# Patient Record
Sex: Female | Born: 1965 | Race: White | Hispanic: No | Marital: Married | Smoking: Never smoker
Health system: Southern US, Community
[De-identification: ages and names within clinical notes are randomized; demographics above are authoritative.]

## PROBLEM LIST (undated history)

## (undated) DIAGNOSIS — F419 Anxiety disorder, unspecified: Secondary | ICD-10-CM

## (undated) DIAGNOSIS — J45909 Unspecified asthma, uncomplicated: Secondary | ICD-10-CM

## (undated) DIAGNOSIS — I319 Disease of pericardium, unspecified: Secondary | ICD-10-CM

## (undated) DIAGNOSIS — I219 Acute myocardial infarction, unspecified: Secondary | ICD-10-CM

## (undated) DIAGNOSIS — I2510 Atherosclerotic heart disease of native coronary artery without angina pectoris: Secondary | ICD-10-CM

## (undated) DIAGNOSIS — I509 Heart failure, unspecified: Secondary | ICD-10-CM

## (undated) DIAGNOSIS — M1990 Unspecified osteoarthritis, unspecified site: Secondary | ICD-10-CM

## (undated) DIAGNOSIS — J189 Pneumonia, unspecified organism: Secondary | ICD-10-CM

## (undated) HISTORY — PX: NASAL SEPTUM SURGERY: SHX37

## (undated) HISTORY — PX: DILATION AND CURETTAGE OF UTERUS: SHX78

## (undated) HISTORY — PX: COLONOSCOPY: SHX174

## (undated) HISTORY — PX: CARDIAC CATHETERIZATION: SHX172

## (undated) HISTORY — PX: OTHER SURGICAL HISTORY: SHX169

---

## 1998-01-04 ENCOUNTER — Ambulatory Visit (HOSPITAL_COMMUNITY): Admission: RE | Admit: 1998-01-04 | Discharge: 1998-01-04 | Payer: Self-pay | Admitting: *Deleted

## 1998-02-19 ENCOUNTER — Other Ambulatory Visit: Admission: RE | Admit: 1998-02-19 | Discharge: 1998-02-19 | Payer: Self-pay | Admitting: Obstetrics & Gynecology

## 1998-02-26 ENCOUNTER — Inpatient Hospital Stay (HOSPITAL_COMMUNITY): Admission: AD | Admit: 1998-02-26 | Discharge: 1998-02-28 | Payer: Self-pay | Admitting: *Deleted

## 1998-03-02 ENCOUNTER — Encounter (HOSPITAL_COMMUNITY): Admission: RE | Admit: 1998-03-02 | Discharge: 1998-05-31 | Payer: Self-pay | Admitting: *Deleted

## 1998-06-17 ENCOUNTER — Encounter (HOSPITAL_COMMUNITY): Admission: RE | Admit: 1998-06-17 | Discharge: 1998-09-15 | Payer: Self-pay | Admitting: *Deleted

## 1998-08-23 ENCOUNTER — Other Ambulatory Visit: Admission: RE | Admit: 1998-08-23 | Discharge: 1998-08-23 | Payer: Self-pay | Admitting: *Deleted

## 1999-09-14 ENCOUNTER — Other Ambulatory Visit: Admission: RE | Admit: 1999-09-14 | Discharge: 1999-09-14 | Payer: Self-pay | Admitting: *Deleted

## 2000-09-11 ENCOUNTER — Other Ambulatory Visit: Admission: RE | Admit: 2000-09-11 | Discharge: 2000-09-11 | Payer: Self-pay | Admitting: *Deleted

## 2001-09-18 ENCOUNTER — Other Ambulatory Visit: Admission: RE | Admit: 2001-09-18 | Discharge: 2001-09-18 | Payer: Self-pay | Admitting: *Deleted

## 2002-09-05 ENCOUNTER — Encounter: Payer: Self-pay | Admitting: Internal Medicine

## 2002-09-05 ENCOUNTER — Encounter: Admission: RE | Admit: 2002-09-05 | Discharge: 2002-09-05 | Payer: Self-pay | Admitting: Internal Medicine

## 2002-09-15 ENCOUNTER — Other Ambulatory Visit: Admission: RE | Admit: 2002-09-15 | Discharge: 2002-09-15 | Payer: Self-pay | Admitting: *Deleted

## 2002-10-13 ENCOUNTER — Ambulatory Visit (HOSPITAL_COMMUNITY): Admission: RE | Admit: 2002-10-13 | Discharge: 2002-10-13 | Payer: Self-pay | Admitting: Internal Medicine

## 2003-09-08 ENCOUNTER — Other Ambulatory Visit: Admission: RE | Admit: 2003-09-08 | Discharge: 2003-09-08 | Payer: Self-pay | Admitting: Obstetrics and Gynecology

## 2003-10-29 ENCOUNTER — Ambulatory Visit (HOSPITAL_COMMUNITY): Admission: RE | Admit: 2003-10-29 | Discharge: 2003-10-29 | Payer: Self-pay | Admitting: *Deleted

## 2005-05-15 ENCOUNTER — Encounter (INDEPENDENT_AMBULATORY_CARE_PROVIDER_SITE_OTHER): Payer: Self-pay | Admitting: *Deleted

## 2005-05-15 ENCOUNTER — Ambulatory Visit (HOSPITAL_COMMUNITY): Admission: RE | Admit: 2005-05-15 | Discharge: 2005-05-15 | Payer: Self-pay | Admitting: *Deleted

## 2005-05-25 ENCOUNTER — Ambulatory Visit (HOSPITAL_COMMUNITY): Admission: RE | Admit: 2005-05-25 | Discharge: 2005-05-25 | Payer: Self-pay | Admitting: *Deleted

## 2005-09-07 HISTORY — PX: OTHER SURGICAL HISTORY: SHX169

## 2009-10-05 ENCOUNTER — Encounter: Admission: RE | Admit: 2009-10-05 | Discharge: 2009-10-05 | Payer: Self-pay | Admitting: Internal Medicine

## 2010-12-23 NOTE — Op Note (Signed)
Bonnie Nelson, ADMIRE                  ACCOUNT NO.:  [id number]   MEDICAL RECORD NO.:  [id number]          PATIENT TYPE:  AMB   LOCATION:  ENDO                         FACILITY:  [facility]   PHYSICIAN:  Georgiana Spinner, M.D.    DATE OF BIRTH:  Jan 19, 1966   DATE OF PROCEDURE:  DATE OF DISCHARGE:                                 OPERATIVE REPORT   PROCEDURE:  Upper endoscopy with dilation and biopsy.   INDICATIONS:  Dysphagia.   ANESTHESIA:  Demerol 100, versed 10 mg.   PROCEDURE:  With the patient mildly sedated in room 2 of Radiology at [facility], the Olympus videoscopic endoscope was inserted in the mouth  and passed under direct vision through the esophagus, which was full of  refluxate.  We suctioned this out and advanced into the stomach and the  stomach was actually fairly full of bilious material, which we wound up  suctioning as well.  We pulled back to the esophagus and then advanced  distally once again, taking circumferential views of the esophageal mucosa,  until we reached the distal esophagus.  We pushed through the sphincter  fairly easily.  The Z-line was somewhat indistinct.  We advanced into the  stomach.  The fundus, body, antrum, duodenal bulb, and second portion of the  duodenum appeared normal.  From this point, the endoscope was slowly  withdrawn, taking circumferential views of the duodenal mucosa until the  endoscope had been pulled back into the stomach and placed in retroflexion  with view of the stomach from below.  The scope was then straightened and a  guidewire was passed.  The endoscope was withdrawn.  Subsequently, a 17  Savary dilator was passed rather easily over the guidewire.  With this, the  guidewire and the dilator were removed and the endoscope was reinserted.  No  bleeding was seen and we advanced to the distal esophagus and a biopsy was  taken.  The endoscope was withdrawn.  The patient's vital signs and pulse  oximetry remained stable.   The patient tolerated the procedure well without  apparent complications.   FINDINGS:  Rather unremarkable examination with dilation of the distal  esophageal sphincter to 17 Savary.  There was free reflux seen and copious  amounts of refluxate seen in the esophagus.  Biopsy of the distal esophagus  was taken, await biopsy report.   PLAN:  The patient obviously is having significant reflux and she may be a  candidate for reflux surgery.  We will await biopsy and have the patient  follow up with me.  If so, we will plan on doing esophageal manometry to  assess the ability of the esophagus to withstand a wrap and consider her for  this but I think given the findings, she certainly would be a good candidate  with persistent reflux despite b.i.d. Aciphex therapy.           ______________________________  Georgiana Spinner, M.D.     GMO/MEDQ  D:  05/15/2005  T:  05/15/2005  Job:  [id number]

## 2010-12-23 NOTE — Op Note (Signed)
[name], Bonnie Nelson                            ACCOUNT NO.:  [id number]   MEDICAL RECORD NO.:  [id number]                   PATIENT TYPE:  AMB   LOCATION:  ENDO                                 FACILITY:  [facility]   PHYSICIAN:  Georgiana Spinner, M.D.                 DATE OF BIRTH:  24-Apr-1966   DATE OF PROCEDURE:  10/29/2003  DATE OF DISCHARGE:                                 OPERATIVE REPORT   PROCEDURE:  Upper endoscopy.   INDICATIONS:  GERD symptoms.   ANESTHESIA:  Demerol 70, Versed 8 mg.   DESCRIPTION OF PROCEDURE:  With the patient mildly sedated in the left  lateral decubitus position, the Olympus videoscopic endoscope was inserted  in the mouth and passed under direct vision through the esophagus which  appeared normal.  There was no evidence of Barrett's esophagus seen.  We  entered into the stomach, fundus, body, antrum, duodenal bulb, second  portion of the duodenum appeared normal.  From this point the endoscope was  slowly withdrawn taking circumferential views of the duodenal mucosa.  The  endoscope was pulled back into the stomach, placed on retroflexion view of  the stomach from below.  The endoscope was straightened and withdrawn taking  circumferential views of the remaining gastric and esophageal mucosa.  The  patient's vital signs and pulse oximetry remained stable.  The patient  tolerated the procedure well without apparent complications.   FINDINGS:  Unremarkable examination.   PLAN:  Will increase the patient's PPI dose to b.i.d. and have the patient  follow up with me as an outpatient.                                               Georgiana Spinner, M.D.    GMO/MEDQ  D:  10/29/2003  T:  10/29/2003  Job:  [id number]

## 2014-08-05 ENCOUNTER — Other Ambulatory Visit: Payer: Self-pay | Admitting: Internal Medicine

## 2014-08-05 DIAGNOSIS — R809 Proteinuria, unspecified: Secondary | ICD-10-CM

## 2014-08-06 ENCOUNTER — Encounter (INDEPENDENT_AMBULATORY_CARE_PROVIDER_SITE_OTHER): Payer: Self-pay

## 2014-08-06 ENCOUNTER — Ambulatory Visit
Admission: RE | Admit: 2014-08-06 | Discharge: 2014-08-06 | Disposition: A | Discharge status: Home / Self Care | Payer: Self-pay | Source: Ambulatory Visit | Attending: Internal Medicine | Admitting: Internal Medicine

## 2014-08-06 DIAGNOSIS — R809 Proteinuria, unspecified: Secondary | ICD-10-CM

## 2019-04-28 ENCOUNTER — Ambulatory Visit
Admission: RE | Admit: 2019-04-28 | Discharge: 2019-04-28 | Disposition: A | Discharge status: Home / Self Care | Payer: BC Managed Care – PPO | Source: Ambulatory Visit | Attending: Allergy and Immunology | Admitting: Allergy and Immunology

## 2019-04-28 ENCOUNTER — Other Ambulatory Visit: Payer: Self-pay | Admitting: Allergy and Immunology

## 2019-04-28 DIAGNOSIS — J4530 Mild persistent asthma, uncomplicated: Secondary | ICD-10-CM

## 2019-09-14 ENCOUNTER — Emergency Department (HOSPITAL_COMMUNITY): Payer: BC Managed Care – PPO

## 2019-09-14 ENCOUNTER — Inpatient Hospital Stay (HOSPITAL_COMMUNITY)
Admission: EM | Admit: 2019-09-14 | Discharge: 2019-09-18 | DRG: 315 | Disposition: A | Discharge status: Home / Self Care | Payer: BC Managed Care – PPO | Attending: Cardiovascular Disease | Admitting: Cardiovascular Disease

## 2019-09-14 ENCOUNTER — Other Ambulatory Visit: Payer: Self-pay

## 2019-09-14 ENCOUNTER — Encounter (HOSPITAL_COMMUNITY): Payer: Self-pay | Admitting: Emergency Medicine

## 2019-09-14 DIAGNOSIS — N179 Acute kidney failure, unspecified: Secondary | ICD-10-CM | POA: Diagnosis present

## 2019-09-14 DIAGNOSIS — I249 Acute ischemic heart disease, unspecified: Secondary | ICD-10-CM | POA: Diagnosis present

## 2019-09-14 DIAGNOSIS — N189 Chronic kidney disease, unspecified: Secondary | ICD-10-CM | POA: Diagnosis present

## 2019-09-14 DIAGNOSIS — R079 Chest pain, unspecified: Secondary | ICD-10-CM

## 2019-09-14 DIAGNOSIS — F410 Panic disorder [episodic paroxysmal anxiety] without agoraphobia: Secondary | ICD-10-CM | POA: Diagnosis present

## 2019-09-14 DIAGNOSIS — J45909 Unspecified asthma, uncomplicated: Secondary | ICD-10-CM | POA: Diagnosis present

## 2019-09-14 DIAGNOSIS — Z79899 Other long term (current) drug therapy: Secondary | ICD-10-CM

## 2019-09-14 DIAGNOSIS — I309 Acute pericarditis, unspecified: Secondary | ICD-10-CM | POA: Diagnosis present

## 2019-09-14 DIAGNOSIS — Z7951 Long term (current) use of inhaled steroids: Secondary | ICD-10-CM

## 2019-09-14 DIAGNOSIS — D509 Iron deficiency anemia, unspecified: Secondary | ICD-10-CM | POA: Diagnosis present

## 2019-09-14 DIAGNOSIS — I472 Ventricular tachycardia: Secondary | ICD-10-CM | POA: Diagnosis present

## 2019-09-14 DIAGNOSIS — D72829 Elevated white blood cell count, unspecified: Secondary | ICD-10-CM | POA: Diagnosis present

## 2019-09-14 DIAGNOSIS — J209 Acute bronchitis, unspecified: Secondary | ICD-10-CM | POA: Diagnosis present

## 2019-09-14 DIAGNOSIS — R739 Hyperglycemia, unspecified: Secondary | ICD-10-CM | POA: Diagnosis present

## 2019-09-14 DIAGNOSIS — Z20822 Contact with and (suspected) exposure to covid-19: Secondary | ICD-10-CM | POA: Diagnosis present

## 2019-09-14 HISTORY — DX: Unspecified asthma, uncomplicated: J45.909

## 2019-09-14 LAB — CBC WITH DIFFERENTIAL/PLATELET
Abs Immature Granulocytes: 0.09 10*3/uL — ABNORMAL HIGH (ref 0.00–0.07)
Basophils Absolute: 0.1 10*3/uL (ref 0.0–0.1)
Basophils Relative: 1 %
Eosinophils Absolute: 0 10*3/uL (ref 0.0–0.5)
Eosinophils Relative: 0 %
HCT: 34.5 % — ABNORMAL LOW (ref 36.0–46.0)
Hemoglobin: 10.6 g/dL — ABNORMAL LOW (ref 12.0–15.0)
Immature Granulocytes: 1 %
Lymphocytes Relative: 11 %
Lymphs Abs: 2 10*3/uL (ref 0.7–4.0)
MCH: 24.7 pg — ABNORMAL LOW (ref 26.0–34.0)
MCHC: 30.7 g/dL (ref 30.0–36.0)
MCV: 80.2 fL (ref 80.0–100.0)
Monocytes Absolute: 1.5 10*3/uL — ABNORMAL HIGH (ref 0.1–1.0)
Monocytes Relative: 8 %
Neutro Abs: 14.6 10*3/uL — ABNORMAL HIGH (ref 1.7–7.7)
Neutrophils Relative %: 79 %
Platelets: 333 10*3/uL (ref 150–400)
RBC: 4.3 MIL/uL (ref 3.87–5.11)
RDW: 15.5 % (ref 11.5–15.5)
WBC: 18.4 10*3/uL — ABNORMAL HIGH (ref 4.0–10.5)
nRBC: 0 % (ref 0.0–0.2)

## 2019-09-14 LAB — POC SARS CORONAVIRUS 2 AG -  ED: SARS Coronavirus 2 Ag: NEGATIVE

## 2019-09-14 LAB — IRON AND TIBC
Iron: 25 ug/dL — ABNORMAL LOW (ref 28–170)
Saturation Ratios: 7 % — ABNORMAL LOW (ref 10.4–31.8)
TIBC: 357 ug/dL (ref 250–450)
UIBC: 332 ug/dL

## 2019-09-14 LAB — COMPREHENSIVE METABOLIC PANEL
ALT: 17 U/L (ref 0–44)
AST: 19 U/L (ref 15–41)
Albumin: 3.4 g/dL — ABNORMAL LOW (ref 3.5–5.0)
Alkaline Phosphatase: 84 U/L (ref 38–126)
Anion gap: 13 (ref 5–15)
BUN: 10 mg/dL (ref 6–20)
CO2: 19 mmol/L — ABNORMAL LOW (ref 22–32)
Calcium: 8.5 mg/dL — ABNORMAL LOW (ref 8.9–10.3)
Chloride: 104 mmol/L (ref 98–111)
Creatinine, Ser: 1.21 mg/dL — ABNORMAL HIGH (ref 0.44–1.00)
GFR calc Af Amer: 58 mL/min — ABNORMAL LOW (ref >60)
GFR calc non Af Amer: 50 mL/min — ABNORMAL LOW (ref >60)
Glucose, Bld: 144 mg/dL — ABNORMAL HIGH (ref 70–99)
Potassium: 4 mmol/L (ref 3.5–5.1)
Sodium: 136 mmol/L (ref 135–145)
Total Bilirubin: 0.5 mg/dL (ref 0.3–1.2)
Total Protein: 6.8 g/dL (ref 6.5–8.1)

## 2019-09-14 LAB — FERRITIN: Ferritin: 22 ng/mL (ref 11–307)

## 2019-09-14 LAB — TROPONIN I (HIGH SENSITIVITY)
Troponin I (High Sensitivity): <2 ng/L (ref <18)
Troponin I (High Sensitivity): <2 ng/L (ref <18)

## 2019-09-14 LAB — RESPIRATORY PANEL BY RT PCR (FLU A&B, COVID)
Influenza A by PCR: NEGATIVE
Influenza B by PCR: NEGATIVE
SARS Coronavirus 2 by RT PCR: NEGATIVE

## 2019-09-14 LAB — HEPARIN LEVEL (UNFRACTIONATED): Heparin Unfractionated: 0.17 IU/mL — ABNORMAL LOW (ref 0.30–0.70)

## 2019-09-14 LAB — HEMOGLOBIN A1C
Hgb A1c MFr Bld: 5.6 % (ref 4.8–5.6)
Mean Plasma Glucose: 114.02 mg/dL

## 2019-09-14 LAB — HIV ANTIBODY (ROUTINE TESTING W REFLEX): HIV Screen 4th Generation wRfx: NONREACTIVE

## 2019-09-14 LAB — LIPASE, BLOOD: Lipase: 26 U/L (ref 11–51)

## 2019-09-14 LAB — D-DIMER, QUANTITATIVE: D-Dimer, Quant: 2.13 ug/mL-FEU — ABNORMAL HIGH (ref 0.00–0.50)

## 2019-09-14 MED ORDER — SODIUM CHLORIDE 0.9 % IV SOLN: INTRAVENOUS | Status: DC

## 2019-09-14 MED ORDER — LEVOCETIRIZINE DIHYDROCHLORIDE 5 MG PO TABS: 5.0000 mg | ORAL_TABLET | Freq: Every day | ORAL | Status: DC

## 2019-09-14 MED ORDER — ALBUTEROL SULFATE HFA 108 (90 BASE) MCG/ACT IN AERS
1.0000 | INHALATION_SPRAY | Freq: Four times a day (QID) | RESPIRATORY_TRACT | Status: DC | PRN
  Filled 2019-09-14: qty 6.7

## 2019-09-14 MED ORDER — OXYCODONE HCL 5 MG PO TABS
5.0000 mg | ORAL_TABLET | Freq: Once | ORAL | Status: AC
  Administered 2019-09-14: 5 mg via ORAL
  Filled 2019-09-14: qty 1

## 2019-09-14 MED ORDER — FENTANYL CITRATE (PF) 100 MCG/2ML IJ SOLN
25.0000 ug | Freq: Once | INTRAMUSCULAR | Status: AC
  Administered 2019-09-14: 09:00:00 25 ug via INTRAVENOUS
  Filled 2019-09-14: qty 2

## 2019-09-14 MED ORDER — ATORVASTATIN CALCIUM 40 MG PO TABS
40.0000 mg | ORAL_TABLET | Freq: Every day | ORAL | Status: DC
  Administered 2019-09-14 – 2019-09-17 (×4): 40 mg via ORAL
  Filled 2019-09-14 (×4): qty 1

## 2019-09-14 MED ORDER — FERROUS SULFATE 325 (65 FE) MG PO TABS
325.0000 mg | ORAL_TABLET | Freq: Every day | ORAL | Status: DC
  Administered 2019-09-15 – 2019-09-17 (×3): 325 mg via ORAL
  Filled 2019-09-14 (×3): qty 1

## 2019-09-14 MED ORDER — AZELASTINE-FLUTICASONE 137-50 MCG/ACT NA SUSP: 1.0000 | Freq: Two times a day (BID) | NASAL | Status: DC

## 2019-09-14 MED ORDER — HEPARIN BOLUS VIA INFUSION
4000.0000 [IU] | Freq: Once | INTRAVENOUS | Status: AC
  Administered 2019-09-14: 4000 [IU] via INTRAVENOUS
  Filled 2019-09-14: qty 4000

## 2019-09-14 MED ORDER — LEVALBUTEROL TARTRATE 45 MCG/ACT IN AERO: 1.0000 | INHALATION_SPRAY | Freq: Four times a day (QID) | RESPIRATORY_TRACT | Status: DC | PRN

## 2019-09-14 MED ORDER — ALPRAZOLAM 0.25 MG PO TABS
0.2500 mg | ORAL_TABLET | Freq: Three times a day (TID) | ORAL | Status: DC | PRN
  Administered 2019-09-14: 0.25 mg via ORAL
  Filled 2019-09-14: qty 1

## 2019-09-14 MED ORDER — FENTANYL CITRATE (PF) 100 MCG/2ML IJ SOLN
25.0000 ug | Freq: Once | INTRAMUSCULAR | Status: AC
  Administered 2019-09-14: 25 ug via INTRAVENOUS
  Filled 2019-09-14: qty 2

## 2019-09-14 MED ORDER — AZELASTINE HCL 0.1 % NA SOLN
1.0000 | Freq: Two times a day (BID) | NASAL | Status: DC
  Administered 2019-09-16 – 2019-09-17 (×3): 1 via NASAL
  Filled 2019-09-14: qty 30

## 2019-09-14 MED ORDER — LORATADINE 10 MG PO TABS
10.0000 mg | ORAL_TABLET | Freq: Every day | ORAL | Status: DC
  Administered 2019-09-14 – 2019-09-17 (×4): 10 mg via ORAL
  Filled 2019-09-14 (×4): qty 1

## 2019-09-14 MED ORDER — ACETAMINOPHEN 325 MG PO TABS
650.0000 mg | ORAL_TABLET | ORAL | Status: DC | PRN
  Administered 2019-09-14 – 2019-09-17 (×4): 650 mg via ORAL
  Filled 2019-09-14 (×4): qty 2

## 2019-09-14 MED ORDER — NITROGLYCERIN 0.4 MG SL SUBL: 0.4000 mg | SUBLINGUAL_TABLET | SUBLINGUAL | Status: DC | PRN

## 2019-09-14 MED ORDER — MOMETASONE FURO-FORMOTEROL FUM 200-5 MCG/ACT IN AERO
2.0000 | INHALATION_SPRAY | Freq: Two times a day (BID) | RESPIRATORY_TRACT | Status: DC
  Administered 2019-09-15 – 2019-09-18 (×7): 2 via RESPIRATORY_TRACT
  Filled 2019-09-14: qty 8.8

## 2019-09-14 MED ORDER — SODIUM CHLORIDE 0.9% FLUSH
3.0000 mL | Freq: Two times a day (BID) | INTRAVENOUS | Status: DC
  Administered 2019-09-15 – 2019-09-17 (×4): 3 mL via INTRAVENOUS

## 2019-09-14 MED ORDER — SERTRALINE HCL 100 MG PO TABS
100.0000 mg | ORAL_TABLET | Freq: Every day | ORAL | Status: DC
  Administered 2019-09-15 – 2019-09-17 (×3): 100 mg via ORAL
  Filled 2019-09-14 (×4): qty 1

## 2019-09-14 MED ORDER — MONTELUKAST SODIUM 10 MG PO TABS
10.0000 mg | ORAL_TABLET | Freq: Every day | ORAL | Status: DC
  Administered 2019-09-14 – 2019-09-17 (×4): 10 mg via ORAL
  Filled 2019-09-14 (×4): qty 1

## 2019-09-14 MED ORDER — SODIUM CHLORIDE 0.9 % IV SOLN
250.0000 mL | INTRAVENOUS | Status: DC | PRN
  Administered 2019-09-16: 250 mL via INTRAVENOUS

## 2019-09-14 MED ORDER — ASCORBIC ACID 500 MG PO TABS
250.0000 mg | ORAL_TABLET | Freq: Every day | ORAL | Status: DC
  Administered 2019-09-15 – 2019-09-17 (×3): 250 mg via ORAL
  Filled 2019-09-14 (×4): qty 1

## 2019-09-14 MED ORDER — ASPIRIN EC 81 MG PO TBEC
81.0000 mg | DELAYED_RELEASE_TABLET | Freq: Every day | ORAL | Status: DC
  Administered 2019-09-15 – 2019-09-17 (×3): 81 mg via ORAL
  Filled 2019-09-14 (×3): qty 1

## 2019-09-14 MED ORDER — SODIUM CHLORIDE 0.9% FLUSH: 3.0000 mL | INTRAVENOUS | Status: DC | PRN

## 2019-09-14 MED ORDER — ASPIRIN 81 MG PO CHEW
324.0000 mg | CHEWABLE_TABLET | ORAL | Status: AC
  Filled 2019-09-14: qty 4

## 2019-09-14 MED ORDER — IOHEXOL 350 MG/ML SOLN
100.0000 mL | Freq: Once | INTRAVENOUS | Status: AC | PRN
  Administered 2019-09-14: 77 mL via INTRAVENOUS

## 2019-09-14 MED ORDER — ONDANSETRON HCL 4 MG/2ML IJ SOLN
4.0000 mg | Freq: Four times a day (QID) | INTRAMUSCULAR | Status: DC | PRN
  Filled 2019-09-14: qty 2

## 2019-09-14 MED ORDER — METOPROLOL TARTRATE 25 MG PO TABS
25.0000 mg | ORAL_TABLET | Freq: Two times a day (BID) | ORAL | Status: DC
  Administered 2019-09-14 – 2019-09-17 (×7): 25 mg via ORAL
  Filled 2019-09-14 (×7): qty 1

## 2019-09-14 MED ORDER — FLUTICASONE PROPIONATE 50 MCG/ACT NA SUSP
1.0000 | Freq: Two times a day (BID) | NASAL | Status: DC
  Administered 2019-09-14 – 2019-09-17 (×7): 1 via NASAL
  Filled 2019-09-14: qty 16

## 2019-09-14 MED ORDER — HEPARIN (PORCINE) 25000 UT/250ML-% IV SOLN
1350.0000 [IU]/h | INTRAVENOUS | Status: DC
  Administered 2019-09-14: 950 [IU]/h via INTRAVENOUS
  Administered 2019-09-15: 1350 [IU]/h via INTRAVENOUS
  Filled 2019-09-14 (×2): qty 250

## 2019-09-14 MED ORDER — ASPIRIN 300 MG RE SUPP: 300.0000 mg | RECTAL | Status: AC

## 2019-09-14 MED ORDER — MORPHINE SULFATE (PF) 2 MG/ML IV SOLN: 2.0000 mg | Freq: Once | INTRAVENOUS | Status: DC

## 2019-09-14 NOTE — H&P (Addendum)
Referring Physician: Jodi Marble, MD  [place] is an 54 y.o. female.                       Chief Complaint: Chest pain  HPI: 54 years old female with PMH of panic attacks and asthma had chest pain under the left breast radiating to jaw on and off x 3 days. She took two alprazolam tablets over 3 hours gape (as she thought it was her panic attack) with no relief. SL NTG helped but gave nausea. Some of the chest discomfort is increased by breathing and palpation  Under left breast also. Her cardiac Monitor shows frequent APCs, VPCs and non-sustained VT. Her HS- troponin I is normal but WBC count and hs-CRP is igh. CT chest is negative for PE. SARS 2 by PCR is pending. EKG shows SR with APCs and subtle PR drop in inferior leads.  Past medical history: No type 2 DM, HTN, Smoking, alcohol or drug intake.  History reviewed. No pertinent surgical history.  No family history on file. Social History:  has no history of tobacco, alcohol, and drug use.  Allergies: Not on File  (Not in a hospital admission)   Results for orders placed or performed during the hospital encounter of 09/14/19 (from the past 48 hour(s))  Troponin I (High Sensitivity)     Status: None   Collection Time: 09/14/19  9:10 AM  Result Value Ref Range   Troponin I (High Sensitivity) <2 <18 ng/L    Comment: (NOTE) Elevated high sensitivity troponin I (hsTnI) values and significant  changes across serial measurements may suggest ACS but many other  chronic and acute conditions are known to elevate hsTnI results.  Refer to the Links section for chest pain algorithms and additional  guidance. Performed at [facility], [place] [address], [place], [place] [address]   CBC with Differential     Status: Abnormal   Collection Time: 09/14/19  9:10 AM  Result Value Ref Range   WBC 18.4 (H) 4.0 - 10.5 K/uL   RBC 4.30 3.87 - 5.11 MIL/uL   Hemoglobin 10.6 (L) 12.0 - 15.0 g/dL   HCT 34.5 (L) 36.0 - 46.0 %   MCV 80.2 80.0 -  100.0 fL   MCH 24.7 (L) 26.0 - 34.0 pg   MCHC 30.7 30.0 - 36.0 g/dL   RDW 15.5 11.5 - 15.5 %   Platelets 333 150 - 400 K/uL   nRBC 0.0 0.0 - 0.2 %   Neutrophils Relative % 79 %   Neutro Abs 14.6 (H) 1.7 - 7.7 K/uL   Lymphocytes Relative 11 %   Lymphs Abs 2.0 0.7 - 4.0 K/uL   Monocytes Relative 8 %   Monocytes Absolute 1.5 (H) 0.1 - 1.0 K/uL   Eosinophils Relative 0 %   Eosinophils Absolute 0.0 0.0 - 0.5 K/uL   Basophils Relative 1 %   Basophils Absolute 0.1 0.0 - 0.1 K/uL   Immature Granulocytes 1 %   Abs Immature Granulocytes 0.09 (H) 0.00 - 0.07 K/uL    Comment: Performed at [facility] [address], [place], [place] [address]  Comprehensive metabolic panel     Status: Abnormal   Collection Time: 09/14/19  9:10 AM  Result Value Ref Range   Sodium 136 135 - 145 mmol/L   Potassium 4.0 3.5 - 5.1 mmol/L   Chloride 104 98 - 111 mmol/L   CO2 19 (L) 22 - 32 mmol/L  Glucose, Bld 144 (H) 70 - 99 mg/dL   BUN 10 6 - 20 mg/dL   Creatinine, Ser 1.21 (H) 0.44 - 1.00 mg/dL   Calcium 8.5 (L) 8.9 - 10.3 mg/dL   Total Protein 6.8 6.5 - 8.1 g/dL   Albumin 3.4 (L) 3.5 - 5.0 g/dL   AST 19 15 - 41 U/L   ALT 17 0 - 44 U/L   Alkaline Phosphatase 84 38 - 126 U/L   Total Bilirubin 0.5 0.3 - 1.2 mg/dL   GFR calc non Af Amer 50 (L) >60 mL/min   GFR calc Af Amer 58 (L) >60 mL/min   Anion gap 13 5 - 15    Comment: Performed at [facility] [address], [place], [place] [address]  Lipase, blood     Status: None   Collection Time: 09/14/19  9:10 AM  Result Value Ref Range   Lipase 26 11 - 51 U/L    Comment: Performed at [facility] [address] [place], [place] [address]  D-dimer, quantitative (not at [facility])     Status: Abnormal   Collection Time: 09/14/19  9:10 AM  Result Value Ref Range   D-Dimer, Quant 2.13 (H) 0.00 - 0.50 ug/mL-FEU    Comment: (NOTE) At the manufacturer cut-off of 0.50 ug/mL FEU, this assay has been documented to exclude PE with a sensitivity  and negative predictive value of 97 to 99%.  At this time, this assay has not been approved by the FDA to exclude DVT/VTE. Results should be correlated with clinical presentation. Performed at [facility], [place] [address] [place], [place] [address]   POC SARS Coronavirus 2 Ag-ED - Nasal Swab (BD Veritor Kit)     Status: None   Collection Time: 09/14/19  9:33 AM  Result Value Ref Range   SARS Coronavirus 2 Ag NEGATIVE NEGATIVE    Comment: (NOTE) SARS-CoV-2 antigen NOT DETECTED.  Negative results are presumptive.  Negative results do not preclude SARS-CoV-2 infection and should not be used as the sole basis for treatment or other patient management decisions, including infection  control decisions, particularly in the presence of clinical signs and  symptoms consistent with COVID-19, or in those who have been in contact with the virus.  Negative results must be combined with clinical observations, patient history, and epidemiological information. The expected result is Negative. Fact Sheet for Patients: PodPark.tn Fact Sheet for Healthcare Providers: GiftContent.is This test is not yet approved or cleared by the [place] FDA and  has been authorized for detection and/or diagnosis of SARS-CoV-2 by FDA under an Emergency Use Authorization (EUA).  This EUA will remain in effect (meaning this test can be used) for the duration of  the COVID-19 de claration under Section 564(b)(1) of the Act, 21 U.S.C. section 360bbb-3(b)(1), unless the authorization is terminated or revoked sooner.   Troponin I (High Sensitivity)     Status: None   Collection Time: 09/14/19 11:21 AM  Result Value Ref Range   Troponin I (High Sensitivity) <2 <18 ng/L    Comment: (NOTE) Elevated high sensitivity troponin I (hsTnI) values and significant  changes across serial measurements may suggest ACS but many other  chronic and acute conditions  are known to elevate hsTnI results.  Refer to the Links section for chest pain algorithms and additional  guidance. Performed at [facility], [place] [address], [place], [place] [address]    CT Angio Chest PE W and/or Wo  Contrast  Result Date: 09/14/2019 CLINICAL DATA:  54 year old female with acute chest pain and shortness of breath. EXAM: CT ANGIOGRAPHY CHEST WITH CONTRAST TECHNIQUE: Multidetector CT imaging of the chest was performed using the standard protocol during bolus administration of intravenous contrast. Multiplanar CT image reconstructions and MIPs were obtained to evaluate the vascular anatomy. CONTRAST:  60m OMNIPAQUE IOHEXOL 350 MG/ML SOLN COMPARISON:  09/14/2019 and prior radiographs FINDINGS: Cardiovascular: This is a technically satisfactory study. No pulmonary emboli are identified. UPPER limits normal heart size noted. No thoracic aortic aneurysm or pericardial effusion. Mediastinum/Nodes: No enlarged mediastinal, hilar, or axillary lymph nodes. Thyroid gland, trachea, and esophagus demonstrate no significant findings. Lungs/Pleura: Mild subsegmental bibasilar atelectasis noted. There is no evidence of airspace disease, consolidation, nodule, mass, pleural effusion or pneumothorax. Upper Abdomen: No acute abnormality. Musculoskeletal: No acute or suspicious bony abnormalities. Review of the MIP images confirms the above findings. IMPRESSION: 1. No evidence of pulmonary emboli. 2. Mild subsegmental bibasilar atelectasis. 3. No other significant abnormalities. Electronically Signed   By: JMargarette CanadaM.D.   On: 09/14/2019 10:43   DG Chest Portable 1 View  Result Date: 09/14/2019 CLINICAL DATA:  Chest pain. EXAM: PORTABLE CHEST 1 VIEW COMPARISON:  April 28, 2019. FINDINGS: The heart size and mediastinal contours are within normal limits. Minimal bibasilar subsegmental atelectasis is noted. No pneumothorax or pleural effusion is noted. The visualized skeletal structures are  unremarkable. IMPRESSION: Minimal bibasilar subsegmental atelectasis. Electronically Signed   By: JMarijo ConceptionM.D.   On: 09/14/2019 09:32    Review Of Systems Constitutional: Positive fever, chills, weight loss or gain. Eyes: No vision change, wears glasses. No discharge or pain. Ears: No hearing loss, No tinnitus. Respiratory: No asthma, COPD, pneumonias. Positive shortness of breath. No hemoptysis. Cardiovascular: Positive chest pain, palpitation, no leg edema. Gastrointestinal: No nausea, vomiting, diarrhea, constipation. No GI bleed. No hepatitis. Genitourinary: No dysuria, hematuria, kidney stone. No incontinance. Neurological: No headache, stroke, seizures.  Psychiatry: No psych facility admission for anxiety, depression, suicide. No detox. Skin: No rash. Musculoskeletal: No joint pain, positive fibromyalgia. No neck pain, back pain. Lymphadenopathy: No lymphadenopathy. Hematology: No anemia or easy bruising.   Blood pressure (!) 109/58, pulse 75, temperature 100.2 F (37.9 C), temperature source Oral, resp. rate (!) 21, height '5\' 5"'$  (1.651 m), weight 92.1 kg, SpO2 97 %. Body mass index is 33.78 kg/m. General appearance: alert, cooperative, appears stated age and mild respiratory distress Head: Normocephalic, atraumatic. Eyes: Brown eyes, pink conjunctiva, corneas clear. PERRL, EOM's intact. Neck: No adenopathy, no carotid bruit, no JVD, supple, symmetrical, trachea midline and thyroid not enlarged. Resp: Clear to auscultation bilaterally. Tenderness under left breast in mid clavicular and anterior axillary line. Cardio: Regular rate and rhythm, S1, S2 normal, II/VI systolic murmur, no click, rub or gallop GI: Soft, non-tender; bowel sounds normal; no organomegaly. Extremities: No edema, cyanosis or clubbing. Skin: Warm and dry.  Neurologic: Alert and oriented X 3, normal strength. Normal coordination and gait.  Assessment/Plan Acute coronary syndrome NSVT Asthma Panic  attack R/O COVID-19 for leukocytosis, fever, elevated D-dimer Anemia, r/o iron deficiency Hyperglycemia r/o type 2 DM Acute renal failure, possible pre-renal azotemia  IV heparin. SARS 2 PCR. Iron studies + stool for occult blood IV fluids for elevated creatinine. Home medications. If COVID negative, consider NM myocardial perfusion stress test. Echocardiogram for LV function.  Time spent: Review of old records, Lab, x-rays, EKG, other cardiac tests, examination, discussion with patient, nurse and ER doctor over 70 minutes.  Birdie Riddle, MD  09/14/2019, 12:37 PM

## 2019-09-14 NOTE — ED Notes (Signed)
Pt returned from CT °

## 2019-09-14 NOTE — ED Triage Notes (Signed)
Pt arrives by [facility] with complaints of anxiety attack which turned to chest pain radiating to jaw. Pt took her xanax and did not relieve pain. Pt endorses not being about to take a deep breath.   EMS gave 325 aspirin . 1 nitro, 4mg  zofran 100/50 110-150 HR 96% RA 100.1 temp

## 2019-09-14 NOTE — ED Notes (Signed)
PT reports taking 324 ASA while on EMS truck.

## 2019-09-14 NOTE — ED Provider Notes (Signed)
[facility] EMERGENCY DEPARTMENT Provider Note   CSN: [id number] Arrival date & time: 09/14/19  0846     History Chief Complaint  Patient presents with   Chest Pain    Bonnie Nelson is a 54 y.o. female presenting for evaluation of chest pain.  Patient states 2 days ago she developed left lower chest pain.  This was under her breast, and initially felt like a muscle spasm or cramp.  Yesterday she felt poorly, states she was very tired and slept most of the day.  She was awoken from sleep at 130 last night with severe worsening pain.  Pain is constant, worse when she takes a deep breath in.  She thought this was panic attack/anxiety attack, took her prescribed Xanax without improvement of symptoms.  With EMS, she was given aspirin and nitro.  She states initially the nitro helped her pain, but then it returned.  She also felt nauseous with this, was given Zofran chest improved her nausea.  She denies fevers, chills, sore throat, cough, abdominal pain, urinary symptoms, abnormal bowel movements.  He denies previous history of cardiac problems.  She denies family history of early cardiac death.  She denies tobacco or drug use.  She reports intermittent alcohol use, no increase recently.  She has a history of anxiety and asthma, no other medical problems.  Denies a history of diabetes or hypertension.  She denies history of travel, surgeries, mobilization, history of cancer, history of previous DVT/PE, hormone use.  She denies sick contacts or contact with known COVID-19 positive person.  HPI     History reviewed. No pertinent past medical history.  Patient Active Problem List   Diagnosis Date Noted   Acute coronary syndrome (HCC) 09/14/2019    History reviewed. No pertinent surgical history.   OB History   No obstetric history on file.     No family history on file.  Social History   Tobacco Use   Smoking status: Not on file  Substance Use Topics   Alcohol  use: Not on file   Drug use: Not on file    Home Medications Prior to Admission medications   Medication Sig Start Date End Date Taking? Authorizing Provider  ALPRAZolam (XANAX) 0.25 MG tablet Take 0.25 mg by mouth 3 (three) times daily as needed for anxiety. 04/08/19  Yes [provider]  Azelastine-Fluticasone 137-50 MCG/ACT SUSP Place 1 spray into both nostrils 2 (two) times daily. 09/05/19  Yes [provider]  levalbuterol (XOPENEX HFA) 45 MCG/ACT inhaler Inhale 1 puff into the lungs every 4 (four) hours as needed. 06/13/19  Yes [provider]  levocetirizine (XYZAL) 5 MG tablet Take 5 mg by mouth at bedtime. 09/03/19  Yes [provider]  montelukast (SINGULAIR) 10 MG tablet Take 10 mg by mouth daily. 09/03/19  Yes [provider]  sertraline (ZOLOFT) 100 MG tablet Take 100 mg by mouth daily. 07/21/19  Yes [provider]  SYMBICORT 160-4.5 MCG/ACT inhaler Inhale 2 puffs into the lungs 2 (two) times daily. 09/02/19  Yes [provider]    Allergies    Patient has no allergy information on record.  Review of Systems   Review of Systems  Cardiovascular: Positive for chest pain.  All other systems reviewed and are negative.   Physical Exam Updated Vital Signs BP 131/69 (BP Location: Right Arm)    Pulse 89    Temp 99.4 F (37.4 C) (Oral)    Resp 15  Ht 5\' 5"  (1.651 m)    Wt 92.1 kg    SpO2 98%    BMI 33.78 kg/m   Physical Exam  ED Results / Procedures / Treatments   Labs (all labs ordered are listed, but only abnormal results are displayed) Labs Reviewed  CBC WITH DIFFERENTIAL/PLATELET - Abnormal; Notable for the following components:      Result Value   WBC 18.4 (*)    Hemoglobin 10.6 (*)    HCT 34.5 (*)    MCH 24.7 (*)    Neutro Abs 14.6 (*)    Monocytes Absolute 1.5 (*)    Abs Immature Granulocytes 0.09 (*)    All other components within normal limits  COMPREHENSIVE METABOLIC PANEL - Abnormal; Notable for  the following components:   CO2 19 (*)    Glucose, Bld 144 (*)    Creatinine, Ser 1.21 (*)    Calcium 8.5 (*)    Albumin 3.4 (*)    GFR calc non Af Amer 50 (*)    GFR calc Af Amer 58 (*)    All other components within normal limits  D-DIMER, QUANTITATIVE (NOT AT [facility]) - Abnormal; Notable for the following components:   D-Dimer, Quant 2.13 (*)    All other components within normal limits  IRON AND TIBC - Abnormal; Notable for the following components:   Iron 25 (*)    Saturation Ratios 7 (*)    All other components within normal limits  RESPIRATORY PANEL BY RT PCR (FLU A&B, COVID)  LIPASE, BLOOD  FERRITIN  HEMOGLOBIN A1C  HIV ANTIBODY (ROUTINE TESTING W REFLEX)  OCCULT BLOOD X 1 CARD TO LAB, STOOL  HEPARIN LEVEL (UNFRACTIONATED)  POC SARS CORONAVIRUS 2 AG -  ED  TROPONIN I (HIGH SENSITIVITY)  TROPONIN I (HIGH SENSITIVITY)    EKG EKG Interpretation  Date/Time:  Sunday September 14 2019 09:01:18 EST Ventricular Rate:  58 PR Interval:    QRS Duration: 92 QT Interval:  396 QTC Calculation: 389 R Axis:   25 Text Interpretation: Sinus rhythm Atrial premature complexes Borderline short PR interval Confirmed by [birth year]1984 [phone]) on 09/14/2019 9:09:13 AM   Radiology CT Angio Chest PE W and/or Wo Contrast  Result Date: 09/14/2019 CLINICAL DATA:  54 year old female with acute chest pain and shortness of breath. EXAM: CT ANGIOGRAPHY CHEST WITH CONTRAST TECHNIQUE: Multidetector CT imaging of the chest was performed using the standard protocol during bolus administration of intravenous contrast. Multiplanar CT image reconstructions and MIPs were obtained to evaluate the vascular anatomy. CONTRAST:  44mL OMNIPAQUE IOHEXOL 350 MG/ML SOLN COMPARISON:  09/14/2019 and prior radiographs FINDINGS: Cardiovascular: This is a technically satisfactory study. No pulmonary emboli are identified. UPPER limits normal heart size noted. No thoracic aortic aneurysm or pericardial effusion.  Mediastinum/Nodes: No enlarged mediastinal, hilar, or axillary lymph nodes. Thyroid gland, trachea, and esophagus demonstrate no significant findings. Lungs/Pleura: Mild subsegmental bibasilar atelectasis noted. There is no evidence of airspace disease, consolidation, nodule, mass, pleural effusion or pneumothorax. Upper Abdomen: No acute abnormality. Musculoskeletal: No acute or suspicious bony abnormalities. Review of the MIP images confirms the above findings. IMPRESSION: 1. No evidence of pulmonary emboli. 2. Mild subsegmental bibasilar atelectasis. 3. No other significant abnormalities. Electronically Signed   By: 11/12/2019 M.D.   On: 09/14/2019 10:43   DG Chest Portable 1 View  Result Date: 09/14/2019 CLINICAL DATA:  Chest pain. EXAM: PORTABLE CHEST 1 VIEW COMPARISON:  April 28, 2019. FINDINGS: The heart size and mediastinal contours are within normal  limits. Minimal bibasilar subsegmental atelectasis is noted. No pneumothorax or pleural effusion is noted. The visualized skeletal structures are unremarkable. IMPRESSION: Minimal bibasilar subsegmental atelectasis. Electronically Signed   By: Lupita Raider M.D.   On: 09/14/2019 09:32    Procedures Procedures (including critical care time)  Medications Ordered in ED Medications  aspirin chewable tablet 324 mg (324 mg Oral Not Given 09/14/19 1404)    Or  aspirin suppository 300 mg ( Rectal See Alternative 09/14/19 1404)  aspirin EC tablet 81 mg (has no administration in time range)  nitroGLYCERIN (NITROSTAT) SL tablet 0.4 mg (has no administration in time range)  acetaminophen (TYLENOL) tablet 650 mg (650 mg Oral Given 09/14/19 1545)  ondansetron (ZOFRAN) injection 4 mg (has no administration in time range)  sodium chloride flush (NS) 0.9 % injection 3 mL (3 mLs Intravenous Not Given 09/14/19 1404)  sodium chloride flush (NS) 0.9 % injection 3 mL (has no administration in time range)  0.9 %  sodium chloride infusion (has no administration in  time range)  0.9 %  sodium chloride infusion ( Intravenous New Bag/Given 09/14/19 1550)  heparin ADULT infusion 100 units/mL (25000 units/220mL sodium chloride 0.45%) (950 Units/hr Intravenous Transfusing/Transfer 09/14/19 1414)  ALPRAZolam (XANAX) tablet 0.25 mg (0.25 mg Oral Given 09/14/19 1539)  montelukast (SINGULAIR) tablet 10 mg (has no administration in time range)  sertraline (ZOLOFT) tablet 100 mg (has no administration in time range)  mometasone-formoterol (DULERA) 200-5 MCG/ACT inhaler 2 puff (2 puffs Inhalation Not Given 09/14/19 1451)  metoprolol tartrate (LOPRESSOR) tablet 25 mg (25 mg Oral Given 09/14/19 1544)  atorvastatin (LIPITOR) tablet 40 mg (has no administration in time range)  azelastine (ASTELIN) 0.1 % nasal spray 1 spray (has no administration in time range)    And  fluticasone (FLONASE) 50 MCG/ACT nasal spray 1 spray (has no administration in time range)  albuterol (VENTOLIN HFA) 108 (90 Base) MCG/ACT inhaler 1 puff (has no administration in time range)  loratadine (CLARITIN) tablet 10 mg (10 mg Oral Given 09/14/19 1544)  fentaNYL (SUBLIMAZE) injection 25 mcg (25 mcg Intravenous Given 09/14/19 0922)  iohexol (OMNIPAQUE) 350 MG/ML injection 100 mL (77 mLs Intravenous Contrast Given 09/14/19 1022)  fentaNYL (SUBLIMAZE) injection 25 mcg (25 mcg Intravenous Given 09/14/19 1250)  heparin bolus via infusion 4,000 Units (4,000 Units Intravenous Bolus from Bag 09/14/19 1403)    ED Course  I have reviewed the triage vital signs and the nursing notes.  Pertinent labs & imaging results that were available during my care of the patient were reviewed by me and considered in my medical decision making (see chart for details).    MDM Rules/Calculators/A&P                      Patient presenting for evaluation of chest pain.  Physical exam shows patient who appears nontoxic.  Chest pain history story today is concerning as pain is severe and radiating to her jaw.  Additionally, patient found to  have ectopy with EMS, run of SVT as well as multiple PACs and PVCs.  However, patient also has had pain for several days which is less concerning, and pain is reproducible.  Patient has a low-grade fever of 100.2.  Consider Covid.  Consider PE.  Consider ACS.  Will obtain labs, EKG, chest x-ray.  EKG without STEMI.  X-ray viewed interpreted by me, no obvious pneumonia pneumothorax or effusion.  Per radiology, minimal bilateral atelectasis.  Rapid Covid is negative.  Initial troponin negative.  D-dimer is elevated at 2.  As such, will obtain CTA for further evaluation.  Case discussed with attending, Dr. Ralene Bathe evaluated the pt. Pt has improved pain at this time. continues to have ectopy.   CTA negative for PE.  Shows atelectasis.  On reassessment, patient's pain has returned.  She continues to have ectopy.  Heart score 4.  As patient does not have a cardiologist, continues to have ectopy and pain, will consult with cardiology.  Discussed with Dr. Doylene Canard from cardiology who will evaluate the patient.  Patient to be admitted to the cardiology service.  Final Clinical Impression(s) / ED Diagnoses Final diagnoses:  Chest pain, unspecified type    Rx / DC Orders ED Discharge Orders    None       Franchot Heidelberg, PA-C 09/14/19 1558    Quintella Reichert, MD 09/15/19 1219

## 2019-09-14 NOTE — Progress Notes (Signed)
ANTICOAGULATION CONSULT NOTE - Initial Consult  Pharmacy Consult for heparin Indication: chest pain/ACS  Not on File  Patient Measurements: Height: 5\' 5"  (165.1 cm) Weight: 203 lb (92.1 kg) IBW/kg (Calculated) : 57 Heparin Dosing Weight: 78kg  Vital Signs: Temp: 100.2 F (37.9 C) (02/07 0900) Temp Source: Oral (02/07 0900) BP: 109/58 (02/07 1011) Pulse Rate: 75 (02/07 1011)  Labs: Recent Labs    09/14/19 0910 09/14/19 1121  HGB 10.6*  --   HCT 34.5*  --   PLT 333  --   CREATININE 1.21*  --   TROPONINIHS <2 <2    Estimated Creatinine Clearance: 58.9 mL/min (A) (by C-G formula based on SCr of 1.21 mg/dL (H)).   Medical History: History reviewed. No pertinent past medical history.   Assessment: 63 YOF presenting with CP radiating to jaw and lethargy, not on anticoagulation PTA.  H/H 10.6/34.5, plts 333.    Goal of Therapy:  Heparin level 0.3-0.7 units/ml Monitor platelets by anticoagulation protocol: Yes   Plan:  Heparin 4000 units IV x1, and gtt at 950 units/hr F/u 6 hour heparin level F/u Cards plan  11-26-1995, PharmD Clinical Pharmacist Please check AMION for all [facility] Pharm[facility] numbers 09/14/2019 12:42 PM

## 2019-09-14 NOTE — Progress Notes (Signed)
ANTICOAGULATION CONSULT NOTE - Initial Consult  Pharmacy Consult for heparin Indication: chest pain/ACS  Not on File  Patient Measurements: Height: 5\' 5"  (165.1 cm) Weight: 203 lb (92.1 kg) IBW/kg (Calculated) : 57 Heparin Dosing Weight: 78kg  Vital Signs: Temp: 98.8 F (37.1 C) (02/07 1700) Temp Source: Oral (02/07 1700) BP: 105/60 (02/07 1700) Pulse Rate: 74 (02/07 1700)  Labs: Recent Labs    09/14/19 0910 09/14/19 1121 09/14/19 1838  HGB 10.6*  --   --   HCT 34.5*  --   --   PLT 333  --   --   HEPARINUNFRC  --   --  0.17*  CREATININE 1.21*  --   --   TROPONINIHS <2 <2  --     Estimated Creatinine Clearance: 58.9 mL/min (A) (by C-G formula based on SCr of 1.21 mg/dL (H)).   Medical History: History reviewed. No pertinent past medical history.   Assessment: 63 YOF presenting with CP radiating to jaw and lethargy, not on anticoagulation PTA.    HL subtherapeutic after bolus and 950 units/hr.   Goal of Therapy:  Heparin level 0.3-0.7 units/ml Monitor platelets by anticoagulation protocol: Yes   Plan:  Increase Heparin to 1150 units/hr Monitor daily HL, CBC, plt Monitor for signs/symptoms of bleeding  F/u stress test, echo   51, PharmD, BCPS, [facility] Clinical Pharmacist  Please check AMION for all [facility] Pharmacy phone numbers After 10:00 PM, call Main Pharmacy [phone]

## 2019-09-15 ENCOUNTER — Observation Stay (HOSPITAL_COMMUNITY): Payer: BC Managed Care – PPO

## 2019-09-15 DIAGNOSIS — I249 Acute ischemic heart disease, unspecified: Secondary | ICD-10-CM | POA: Diagnosis present

## 2019-09-15 DIAGNOSIS — I472 Ventricular tachycardia: Secondary | ICD-10-CM | POA: Diagnosis present

## 2019-09-15 DIAGNOSIS — D72829 Elevated white blood cell count, unspecified: Secondary | ICD-10-CM | POA: Diagnosis present

## 2019-09-15 DIAGNOSIS — R739 Hyperglycemia, unspecified: Secondary | ICD-10-CM | POA: Diagnosis present

## 2019-09-15 DIAGNOSIS — J45909 Unspecified asthma, uncomplicated: Secondary | ICD-10-CM | POA: Diagnosis present

## 2019-09-15 DIAGNOSIS — Z7951 Long term (current) use of inhaled steroids: Secondary | ICD-10-CM | POA: Diagnosis not present

## 2019-09-15 DIAGNOSIS — N179 Acute kidney failure, unspecified: Secondary | ICD-10-CM | POA: Diagnosis present

## 2019-09-15 DIAGNOSIS — I309 Acute pericarditis, unspecified: Secondary | ICD-10-CM | POA: Diagnosis present

## 2019-09-15 DIAGNOSIS — Z79899 Other long term (current) drug therapy: Secondary | ICD-10-CM | POA: Diagnosis not present

## 2019-09-15 DIAGNOSIS — F410 Panic disorder [episodic paroxysmal anxiety] without agoraphobia: Secondary | ICD-10-CM | POA: Diagnosis present

## 2019-09-15 DIAGNOSIS — J209 Acute bronchitis, unspecified: Secondary | ICD-10-CM | POA: Diagnosis present

## 2019-09-15 DIAGNOSIS — D509 Iron deficiency anemia, unspecified: Secondary | ICD-10-CM | POA: Diagnosis present

## 2019-09-15 DIAGNOSIS — Z20822 Contact with and (suspected) exposure to covid-19: Secondary | ICD-10-CM | POA: Diagnosis present

## 2019-09-15 DIAGNOSIS — N189 Chronic kidney disease, unspecified: Secondary | ICD-10-CM | POA: Diagnosis present

## 2019-09-15 LAB — CBC WITH DIFFERENTIAL/PLATELET
Abs Immature Granulocytes: 0.09 10*3/uL — ABNORMAL HIGH (ref 0.00–0.07)
Abs Immature Granulocytes: 0.09 10*3/uL — ABNORMAL HIGH (ref 0.00–0.07)
Basophils Absolute: 0.1 10*3/uL (ref 0.0–0.1)
Basophils Absolute: 0.1 10*3/uL (ref 0.0–0.1)
Basophils Relative: 1 %
Basophils Relative: 1 %
Eosinophils Absolute: 0.1 10*3/uL (ref 0.0–0.5)
Eosinophils Absolute: 0.1 10*3/uL (ref 0.0–0.5)
Eosinophils Relative: 1 %
Eosinophils Relative: 1 %
HCT: 32.1 % — ABNORMAL LOW (ref 36.0–46.0)
HCT: 31 % — ABNORMAL LOW (ref 36.0–46.0)
Hemoglobin: 9.9 g/dL — ABNORMAL LOW (ref 12.0–15.0)
Hemoglobin: 9.7 g/dL — ABNORMAL LOW (ref 12.0–15.0)
Immature Granulocytes: 1 %
Immature Granulocytes: 1 %
Lymphocytes Relative: 21 %
Lymphocytes Relative: 20 %
Lymphs Abs: 3.1 10*3/uL (ref 0.7–4.0)
Lymphs Abs: 2.8 10*3/uL (ref 0.7–4.0)
MCH: 24.7 pg — ABNORMAL LOW (ref 26.0–34.0)
MCH: 24.7 pg — ABNORMAL LOW (ref 26.0–34.0)
MCHC: 30.8 g/dL (ref 30.0–36.0)
MCHC: 31.3 g/dL (ref 30.0–36.0)
MCV: 80 fL (ref 80.0–100.0)
MCV: 79.1 fL — ABNORMAL LOW (ref 80.0–100.0)
Monocytes Absolute: 1.6 10*3/uL — ABNORMAL HIGH (ref 0.1–1.0)
Monocytes Absolute: 1.1 10*3/uL — ABNORMAL HIGH (ref 0.1–1.0)
Monocytes Relative: 11 %
Monocytes Relative: 7 %
Neutro Abs: 9.6 10*3/uL — ABNORMAL HIGH (ref 1.7–7.7)
Neutro Abs: 10.2 10*3/uL — ABNORMAL HIGH (ref 1.7–7.7)
Neutrophils Relative %: 65 %
Neutrophils Relative %: 70 %
Platelets: 256 10*3/uL (ref 150–400)
Platelets: 305 10*3/uL (ref 150–400)
RBC: 4.01 MIL/uL (ref 3.87–5.11)
RBC: 3.92 MIL/uL (ref 3.87–5.11)
RDW: 15.5 % (ref 11.5–15.5)
RDW: 15.6 % — ABNORMAL HIGH (ref 11.5–15.5)
WBC: 14.5 10*3/uL — ABNORMAL HIGH (ref 4.0–10.5)
WBC: 14.4 10*3/uL — ABNORMAL HIGH (ref 4.0–10.5)
nRBC: 0 % (ref 0.0–0.2)
nRBC: 0 % (ref 0.0–0.2)

## 2019-09-15 LAB — BASIC METABOLIC PANEL
Anion gap: 10 (ref 5–15)
BUN: 9 mg/dL (ref 6–20)
CO2: 22 mmol/L (ref 22–32)
Calcium: 8.4 mg/dL — ABNORMAL LOW (ref 8.9–10.3)
Chloride: 107 mmol/L (ref 98–111)
Creatinine, Ser: 1.3 mg/dL — ABNORMAL HIGH (ref 0.44–1.00)
GFR calc Af Amer: 53 mL/min — ABNORMAL LOW (ref >60)
GFR calc non Af Amer: 46 mL/min — ABNORMAL LOW (ref >60)
Glucose, Bld: 109 mg/dL — ABNORMAL HIGH (ref 70–99)
Potassium: 3.6 mmol/L (ref 3.5–5.1)
Sodium: 139 mmol/L (ref 135–145)

## 2019-09-15 LAB — LIPID PANEL
Cholesterol: 125 mg/dL (ref 0–200)
HDL: 48 mg/dL (ref >40)
LDL Cholesterol: 62 mg/dL (ref 0–99)
Total CHOL/HDL Ratio: 2.6 RATIO
Triglycerides: 74 mg/dL (ref <150)
VLDL: 15 mg/dL (ref 0–40)

## 2019-09-15 LAB — ECHOCARDIOGRAM COMPLETE
Height: 65 in
Weight: 3288 oz

## 2019-09-15 LAB — HEPARIN LEVEL (UNFRACTIONATED): Heparin Unfractionated: 0.18 IU/mL — ABNORMAL LOW (ref 0.30–0.70)

## 2019-09-15 MED ORDER — REGADENOSON 0.4 MG/5ML IV SOLN
INTRAVENOUS | Status: AC
  Filled 2019-09-15: qty 5

## 2019-09-15 MED ORDER — TECHNETIUM TC 99M TETROFOSMIN IV KIT
32.8000 | PACK | Freq: Once | INTRAVENOUS | Status: AC | PRN
  Administered 2019-09-15: 32.8 via INTRAVENOUS

## 2019-09-15 MED ORDER — SODIUM CHLORIDE 0.9 % IV SOLN
1.0000 g | INTRAVENOUS | Status: DC
  Administered 2019-09-15 – 2019-09-17 (×3): 1 g via INTRAVENOUS
  Filled 2019-09-15 (×3): qty 10

## 2019-09-15 MED ORDER — COLCHICINE 0.6 MG PO TABS
0.6000 mg | ORAL_TABLET | Freq: Every day | ORAL | Status: DC
  Administered 2019-09-15 – 2019-09-17 (×3): 0.6 mg via ORAL
  Filled 2019-09-15 (×3): qty 1

## 2019-09-15 MED ORDER — REGADENOSON 0.4 MG/5ML IV SOLN
0.4000 mg | Freq: Once | INTRAVENOUS | Status: AC
  Administered 2019-09-15: 0.4 mg via INTRAVENOUS
  Filled 2019-09-15: qty 5

## 2019-09-15 MED ORDER — TECHNETIUM TC 99M TETROFOSMIN IV KIT
10.2500 | PACK | Freq: Once | INTRAVENOUS | Status: AC | PRN
  Administered 2019-09-15: 10.25 via INTRAVENOUS

## 2019-09-15 NOTE — Progress Notes (Signed)
  Echocardiogram 2D Echocardiogram has been performed.  Delcie Roch 09/15/2019, 8:43 AM

## 2019-09-15 NOTE — Progress Notes (Signed)
ANTICOAGULATION CONSULT NOTE   Pharmacy Consult for heparin Indication: chest pain/ACS  Not on File  Patient Measurements: Height: 5\' 5"  (165.1 cm) Weight: 205 lb 8 oz (93.2 kg) IBW/kg (Calculated) : 57 Heparin Dosing Weight: 78kg  Vital Signs: Temp: 99.7 F (37.6 C) (02/08 0311) Temp Source: Oral (02/08 0311) BP: 122/65 (02/08 0311) Pulse Rate: 92 (02/08 0311)  Labs: Recent Labs    09/14/19 0910 09/14/19 1121 09/14/19 1838 09/15/19 0241  HGB 10.6*  --   --  9.9*  HCT 34.5*  --   --  32.1*  PLT 333  --   --  256  HEPARINUNFRC  --   --  0.17* 0.18*  CREATININE 1.21*  --   --   --   TROPONINIHS <2 <2  --   --     Estimated Creatinine Clearance: 59.3 mL/min (A) (by C-G formula based on SCr of 1.21 mg/dL (H)).  Assessment: 6 YOF presenting with CP radiating to jaw and lethargy, not on anticoagulation PTA.    HL remains subtherapeutic (0.18) on gtt at 1150 units/hr. No issues with line or bleeding reported per RN.  Goal of Therapy:  Heparin level 0.3-0.7 units/ml Monitor platelets by anticoagulation protocol: Yes   Plan:  Increase Heparin to 1350 units/hr F/u 6 hr heparin level F/u stress test, echo   51, PharmD, BCPS Please see amion for complete clinical pharmacist phone list 09/15/2019 3:25 AM

## 2019-09-15 NOTE — Plan of Care (Signed)
  Problem: Education: Goal: Knowledge of General Education information will improve Description Including pain rating scale, medication(s)/side effects and non-pharmacologic comfort measures Outcome: Progressing   Problem: Clinical Measurements: Goal: Ability to maintain clinical measurements within normal limits will improve Outcome: Progressing   Problem: Activity: Goal: Risk for activity intolerance will decrease Outcome: Progressing   

## 2019-09-15 NOTE — Progress Notes (Signed)
Ref: [name], MD   Subjective:  Feeling better. No reversible ischemia on nuclear stress test. Chest pain improving but has greenish discharge and cough. Echocardiogram shows mild pericardial effusion without tamponade. Normal LV systolic function. Patient denies GI bleed or dark stools. Patient denies prior history of type 2 diabetes and Hgb A1C is 5.6 % LDL cholesterol is 62 mg.  Objective:  Vital Signs in the last 24 hours: Temp:  [99.1 F (37.3 C)-99.8 F (37.7 C)] 99.1 F (37.3 C) (02/08 1441) Pulse Rate:  [57-92] 84 (02/08 1441) Cardiac Rhythm: Normal sinus rhythm (02/08 1015) Resp:  [19-20] 20 (02/08 0311) BP: (111-149)/(58-90) 114/65 (02/08 1441) SpO2:  [92 %-93 %] 92 % (02/08 1441) Weight:  [93.2 kg] 93.2 kg (02/08 0311)  Physical Exam: BP Readings from Last 1 Encounters:  09/15/19 114/65     Wt Readings from Last 1 Encounters:  09/15/19 93.2 kg    Weight change:  Body mass index is 34.2 kg/m. HEENT: [place]/AT, Eyes-Brown, PERL, EOMI, Conjunctiva-Pink, Sclera-Non-icteric Neck: No JVD, No bruit, Trachea midline. Lungs:  Wheezing with cough, Bilateral. Cardiac:  Regular rhythm, normal S1 and S2, no S3. II/VI systolic murmur. Abdomen:  Soft, non-tender. BS present. Extremities:  No edema present. No cyanosis. No clubbing. CNS: AxOx3, Cranial nerves grossly intact, moves all 4 extremities.  Skin: Warm and dry.   Intake/Output from previous day: 02/07 0701 - 02/08 0700 In: 1335.7 [P.O.:360; I.V.:975.7] Out: -     Lab Results: BMET    Component Value Date/Time   NA 139 09/15/2019 0241   NA 136 09/14/2019 0910   K 3.6 09/15/2019 0241   K 4.0 09/14/2019 0910   CL 107 09/15/2019 0241   CL 104 09/14/2019 0910   CO2 22 09/15/2019 0241   CO2 19 (L) 09/14/2019 0910   GLUCOSE 109 (H) 09/15/2019 0241   GLUCOSE 144 (H) 09/14/2019 0910   BUN 9 09/15/2019 0241   BUN 10 09/14/2019 0910   CREATININE 1.30 (H) 09/15/2019 0241   CREATININE 1.21 (H) 09/14/2019 0910    CALCIUM 8.4 (L) 09/15/2019 0241   CALCIUM 8.5 (L) 09/14/2019 0910   GFRNONAA 46 (L) 09/15/2019 0241   GFRNONAA 50 (L) 09/14/2019 0910   GFRAA 53 (L) 09/15/2019 0241   GFRAA 58 (L) 09/14/2019 0910   CBC    Component Value Date/Time   WBC 14.5 (H) 09/15/2019 0241   RBC 4.01 09/15/2019 0241   HGB 9.9 (L) 09/15/2019 0241   HCT 32.1 (L) 09/15/2019 0241   PLT 256 09/15/2019 0241   MCV 80.0 09/15/2019 0241   MCH 24.7 (L) 09/15/2019 0241   MCHC 30.8 09/15/2019 0241   RDW 15.5 09/15/2019 0241   LYMPHSABS 3.1 09/15/2019 0241   MONOABS 1.6 (H) 09/15/2019 0241   EOSABS 0.1 09/15/2019 0241   BASOSABS 0.1 09/15/2019 0241   HEPATIC Function Panel Recent Labs    09/14/19 0910  PROT 6.8   HEMOGLOBIN A1C No components found for: HGA1C,  MPG CARDIAC ENZYMES No results found for: CKTOTAL, CKMB, CKMBINDEX, TROPONINI BNP No results for input(s): PROBNP in the last 8760 hours. TSH No results for input(s): TSH in the last 8760 hours. CHOLESTEROL Recent Labs    09/15/19 0241  CHOL 125    Scheduled Meds: . vitamin C  250 mg Oral Q breakfast  . aspirin EC  81 mg Oral Daily  . atorvastatin  40 mg Oral q1800  . azelastine  1 spray Each Nare BID   And  . fluticasone  1 spray Each Nare BID  . colchicine  0.6 mg Oral Daily  . ferrous sulfate  325 mg Oral Q breakfast  . loratadine  10 mg Oral Daily  . metoprolol tartrate  25 mg Oral BID  . mometasone-formoterol  2 puff Inhalation BID  . montelukast  10 mg Oral Daily  .  morphine injection  2 mg Intravenous Once  . regadenoson      . sertraline  100 mg Oral Daily  . sodium chloride flush  3 mL Intravenous Q12H   Continuous Infusions: . sodium chloride    . sodium chloride 50 mL/hr at 09/14/19 1550  . cefTRIAXone (ROCEPHIN)  IV     PRN Meds:.sodium chloride, acetaminophen, albuterol, ALPRAZolam, nitroGLYCERIN, ondansetron (ZOFRAN) IV, sodium chloride flush  Assessment/Plan: Acute pericarditis NSVT Acute bronchitis CKD,  II Asthma Panic attack Irondeficiency anemia Mild hyperglycemia  IV Rocephin for bronchitis. Colchicine for acute pericarditis FeSO4 for iron deficiency anemia. Check uric acid.    LOS: 0 days   Time spent including chart review, lab review, examination, discussion with patient : 30 min   Dixie Dials  MD  09/15/2019, 6:08 PM

## 2019-09-16 LAB — URIC ACID: Uric Acid, Serum: 5.8 mg/dL (ref 2.5–7.1)

## 2019-09-16 LAB — BASIC METABOLIC PANEL
Anion gap: 9 (ref 5–15)
BUN: 10 mg/dL (ref 6–20)
CO2: 21 mmol/L — ABNORMAL LOW (ref 22–32)
Calcium: 8.5 mg/dL — ABNORMAL LOW (ref 8.9–10.3)
Chloride: 110 mmol/L (ref 98–111)
Creatinine, Ser: 1.12 mg/dL — ABNORMAL HIGH (ref 0.44–1.00)
GFR calc Af Amer: >60 mL/min (ref >60)
GFR calc non Af Amer: 55 mL/min — ABNORMAL LOW (ref >60)
Glucose, Bld: 106 mg/dL — ABNORMAL HIGH (ref 70–99)
Potassium: 3.9 mmol/L (ref 3.5–5.1)
Sodium: 140 mmol/L (ref 135–145)

## 2019-09-16 NOTE — Progress Notes (Signed)
Ref: [name], MD   Subjective:  VS stable. Monitor: Sinus rhythm. Creatinine is 1.12 mg. Normal uric acid level. Fasting blood sugar is 106 mg. Discussed lab, x-ray and echocardiographic findings with patient's daughter per her request.  Objective:  Vital Signs in the last 24 hours: Temp:  [98.2 F (36.8 C)-99.1 F (37.3 C)] 98.2 F (36.8 C) (02/09 0513) Pulse Rate:  [74-85] 85 (02/09 0900) Cardiac Rhythm: Normal sinus rhythm (02/09 0823) Resp:  [16] 16 (02/09 0900) BP: (108-149)/(55-90) 109/59 (02/09 0513) SpO2:  [92 %-98 %] 98 % (02/09 0900) Weight:  [92.4 kg] 92.4 kg (02/09 0500)  Physical Exam: BP Readings from Last 1 Encounters:  09/16/19 (!) 109/59     Wt Readings from Last 1 Encounters:  09/16/19 92.4 kg    Weight change: 0.363 kg Body mass index is 33.91 kg/m. HEENT: [place]/AT, Eyes-Brown, PERL, EOMI, Conjunctiva-Pink, Sclera-Non-icteric Neck: No JVD, No bruit, Trachea midline. Lungs:  Clearing, Bilateral. Cardiac:  Regular rhythm, normal S1 and S2, no S3. II/VI systolic murmur. Abdomen:  Soft, non-tender. BS present. Extremities:  No edema present. No cyanosis. No clubbing. CNS: AxOx3, Cranial nerves grossly intact, moves all 4 extremities.  Skin: Warm and dry.   Intake/Output from previous day: 02/08 0701 - 02/09 0700 In: 843 [P.O.:840; I.V.:3] Out: -     Lab Results: BMET    Component Value Date/Time   NA 140 09/16/2019 0336   NA 139 09/15/2019 0241   NA 136 09/14/2019 0910   K 3.9 09/16/2019 0336   K 3.6 09/15/2019 0241   K 4.0 09/14/2019 0910   CL 110 09/16/2019 0336   CL 107 09/15/2019 0241   CL 104 09/14/2019 0910   CO2 21 (L) 09/16/2019 0336   CO2 22 09/15/2019 0241   CO2 19 (L) 09/14/2019 0910   GLUCOSE 106 (H) 09/16/2019 0336   GLUCOSE 109 (H) 09/15/2019 0241   GLUCOSE 144 (H) 09/14/2019 0910   BUN 10 09/16/2019 0336   BUN 9 09/15/2019 0241   BUN 10 09/14/2019 0910   CREATININE 1.12 (H) 09/16/2019 0336   CREATININE 1.30 (H)  09/15/2019 0241   CREATININE 1.21 (H) 09/14/2019 0910   CALCIUM 8.5 (L) 09/16/2019 0336   CALCIUM 8.4 (L) 09/15/2019 0241   CALCIUM 8.5 (L) 09/14/2019 0910   GFRNONAA 55 (L) 09/16/2019 0336   GFRNONAA 46 (L) 09/15/2019 0241   GFRNONAA 50 (L) 09/14/2019 0910   GFRAA >60 09/16/2019 0336   GFRAA 53 (L) 09/15/2019 0241   GFRAA 58 (L) 09/14/2019 0910   CBC    Component Value Date/Time   WBC 14.4 (H) 09/15/2019 1852   RBC 3.92 09/15/2019 1852   HGB 9.7 (L) 09/15/2019 1852   HCT 31.0 (L) 09/15/2019 1852   PLT 305 09/15/2019 1852   MCV 79.1 (L) 09/15/2019 1852   MCH 24.7 (L) 09/15/2019 1852   MCHC 31.3 09/15/2019 1852   RDW 15.6 (H) 09/15/2019 1852   LYMPHSABS 2.8 09/15/2019 1852   MONOABS 1.1 (H) 09/15/2019 1852   EOSABS 0.1 09/15/2019 1852   BASOSABS 0.1 09/15/2019 1852   HEPATIC Function Panel Recent Labs    09/14/19 0910  PROT 6.8   HEMOGLOBIN A1C No components found for: HGA1C,  MPG CARDIAC ENZYMES No results found for: CKTOTAL, CKMB, CKMBINDEX, TROPONINI BNP No results for input(s): PROBNP in the last 8760 hours. TSH No results for input(s): TSH in the last 8760 hours. CHOLESTEROL Recent Labs    09/15/19 0241  CHOL 125  Scheduled Meds: . vitamin C  250 mg Oral Q breakfast  . aspirin EC  81 mg Oral Daily  . atorvastatin  40 mg Oral q1800  . azelastine  1 spray Each Nare BID   And  . fluticasone  1 spray Each Nare BID  . colchicine  0.6 mg Oral Daily  . ferrous sulfate  325 mg Oral Q breakfast  . loratadine  10 mg Oral Daily  . metoprolol tartrate  25 mg Oral BID  . mometasone-formoterol  2 puff Inhalation BID  . montelukast  10 mg Oral Daily  .  morphine injection  2 mg Intravenous Once  . sertraline  100 mg Oral Daily  . sodium chloride flush  3 mL Intravenous Q12H   Continuous Infusions: . sodium chloride    . sodium chloride 50 mL/hr at 09/14/19 1550  . cefTRIAXone (ROCEPHIN)  IV 1 g (09/15/19 2012)   PRN Meds:.sodium chloride, acetaminophen,  albuterol, ALPRAZolam, nitroGLYCERIN, ondansetron (ZOFRAN) IV, sodium chloride flush  Assessment/Plan: Acute pericarditis Acute bronchitis Non-sustained VT resolved CKD, II Asthma Panic attack Iron deficiency anemia Mild hyperglycemia  Continue IV antibiotics. Continue colchicine. She can not tolerate NSAIDs or high dose aspirin Continue FeSO4 with vitamin C. Increase activity.   LOS: 1 day   Time spent including chart review, lab review, examination, discussion with patient : 30 min   Dixie Dials  MD  09/16/2019, 10:09 AM

## 2019-09-17 ENCOUNTER — Encounter (HOSPITAL_COMMUNITY): Payer: Self-pay | Admitting: Cardiovascular Disease

## 2019-09-17 NOTE — Progress Notes (Signed)
Ref: [name], MD   Subjective:  Feeling better from chest pain point but cough and greenish discharge continues. Mild fever last night.  Objective:  Vital Signs in the last 24 hours: Temp:  [97.8 F (36.6 C)-99.4 F (37.4 C)] 97.8 F (36.6 C) (02/10 0501) Pulse Rate:  [57-86] 82 (02/10 0919) Cardiac Rhythm: Normal sinus rhythm (02/10 0700) Resp:  [17-18] 17 (02/10 0501) BP: (118-130)/(63-69) 118/67 (02/10 0501) SpO2:  [95 %-100 %] 99 % (02/10 0501) Weight:  [92.7 kg] 92.7 kg (02/10 0501)  Physical Exam: BP Readings from Last 1 Encounters:  09/17/19 118/67     Wt Readings from Last 1 Encounters:  09/17/19 92.7 kg    Weight change: 0.227 kg Body mass index is 34 kg/m. HEENT: Bonnie Nelson, Eyes-Brown, PERL, EOMI, Conjunctiva-Pink, Sclera-Non-icteric Neck: No JVD, No bruit, Trachea midline. Lungs:  Clearing, Bilateral. Cardiac:  Regular rhythm, normal S1 and S2, no S3. II/VI systolic murmur. Abdomen:  Soft, non-tender. BS present. Extremities:  No edema present. No cyanosis. No clubbing. CNS: AxOx3, Cranial nerves grossly intact, moves all 4 extremities.  Skin: Warm and dry.   Intake/Output from previous day: 02/09 0701 - 02/10 0700 In: 990 [P.O.:840; I.V.:50; IV Piggyback:100] Out: -     Lab Results: BMET    Component Value Date/Time   NA 140 09/16/2019 0336   NA 139 09/15/2019 0241   NA 136 09/14/2019 0910   K 3.9 09/16/2019 0336   K 3.6 09/15/2019 0241   K 4.0 09/14/2019 0910   CL 110 09/16/2019 0336   CL 107 09/15/2019 0241   CL 104 09/14/2019 0910   CO2 21 (L) 09/16/2019 0336   CO2 22 09/15/2019 0241   CO2 19 (L) 09/14/2019 0910   GLUCOSE 106 (H) 09/16/2019 0336   GLUCOSE 109 (H) 09/15/2019 0241   GLUCOSE 144 (H) 09/14/2019 0910   BUN 10 09/16/2019 0336   BUN 9 09/15/2019 0241   BUN 10 09/14/2019 0910   CREATININE 1.12 (H) 09/16/2019 0336   CREATININE 1.30 (H) 09/15/2019 0241   CREATININE 1.21 (H) 09/14/2019 0910   CALCIUM 8.5 (L) 09/16/2019 0336    CALCIUM 8.4 (L) 09/15/2019 0241   CALCIUM 8.5 (L) 09/14/2019 0910   GFRNONAA 55 (L) 09/16/2019 0336   GFRNONAA 46 (L) 09/15/2019 0241   GFRNONAA 50 (L) 09/14/2019 0910   GFRAA >60 09/16/2019 0336   GFRAA 53 (L) 09/15/2019 0241   GFRAA 58 (L) 09/14/2019 0910   CBC    Component Value Date/Time   WBC 14.4 (H) 09/15/2019 1852   RBC 3.92 09/15/2019 1852   HGB 9.7 (L) 09/15/2019 1852   HCT 31.0 (L) 09/15/2019 1852   PLT 305 09/15/2019 1852   MCV 79.1 (L) 09/15/2019 1852   MCH 24.7 (L) 09/15/2019 1852   MCHC 31.3 09/15/2019 1852   RDW 15.6 (H) 09/15/2019 1852   LYMPHSABS 2.8 09/15/2019 1852   MONOABS 1.1 (H) 09/15/2019 1852   EOSABS 0.1 09/15/2019 1852   BASOSABS 0.1 09/15/2019 1852   HEPATIC Function Panel Recent Labs    09/14/19 0910  PROT 6.8   HEMOGLOBIN A1C No components found for: HGA1C,  MPG CARDIAC ENZYMES No results found for: CKTOTAL, CKMB, CKMBINDEX, TROPONINI BNP No results for input(s): PROBNP in the last 8760 hours. TSH No results for input(s): TSH in the last 8760 hours. CHOLESTEROL Recent Labs    09/15/19 0241  CHOL 125    Scheduled Meds: . vitamin C  250 mg Oral Q breakfast  . aspirin  EC  81 mg Oral Daily  . atorvastatin  40 mg Oral q1800  . azelastine  1 spray Each Nare BID   And  . fluticasone  1 spray Each Nare BID  . colchicine  0.6 mg Oral Daily  . ferrous sulfate  325 mg Oral Q breakfast  . loratadine  10 mg Oral Daily  . metoprolol tartrate  25 mg Oral BID  . mometasone-formoterol  2 puff Inhalation BID  . montelukast  10 mg Oral Daily  .  morphine injection  2 mg Intravenous Once  . sertraline  100 mg Oral Daily  . sodium chloride flush  3 mL Intravenous Q12H   Continuous Infusions: . sodium chloride 250 mL (09/16/19 1712)  . sodium chloride 50 mL/hr at 09/14/19 1550  . cefTRIAXone (ROCEPHIN)  IV 1 g (09/16/19 1715)   PRN Meds:.sodium chloride, acetaminophen, albuterol, ALPRAZolam, nitroGLYCERIN, ondansetron (ZOFRAN) IV, sodium  chloride flush  Assessment/Plan: Acute pericarditis Acute bronchitis NSVT CKD, II Asthma Panic attack Iron deficiency anemia Mild hyperglycemia  Continue medical treatment.  Increase activity.   LOS: 2 days   Time spent including chart review, lab review, examination, discussion with patient :  min   Bonnie Dials  MD  09/17/2019, 10:22 AM

## 2019-09-18 LAB — CBC WITH DIFFERENTIAL/PLATELET
Abs Immature Granulocytes: 0.07 10*3/uL (ref 0.00–0.07)
Basophils Absolute: 0.1 10*3/uL (ref 0.0–0.1)
Basophils Relative: 1 %
Eosinophils Absolute: 0.5 10*3/uL (ref 0.0–0.5)
Eosinophils Relative: 5 %
HCT: 35.9 % — ABNORMAL LOW (ref 36.0–46.0)
Hemoglobin: 11.2 g/dL — ABNORMAL LOW (ref 12.0–15.0)
Immature Granulocytes: 1 %
Lymphocytes Relative: 31 %
Lymphs Abs: 3.1 10*3/uL (ref 0.7–4.0)
MCH: 24.6 pg — ABNORMAL LOW (ref 26.0–34.0)
MCHC: 31.2 g/dL (ref 30.0–36.0)
MCV: 78.9 fL — ABNORMAL LOW (ref 80.0–100.0)
Monocytes Absolute: 0.9 10*3/uL (ref 0.1–1.0)
Monocytes Relative: 9 %
Neutro Abs: 5.6 10*3/uL (ref 1.7–7.7)
Neutrophils Relative %: 53 %
Platelets: 373 10*3/uL (ref 150–400)
RBC: 4.55 MIL/uL (ref 3.87–5.11)
RDW: 15.6 % — ABNORMAL HIGH (ref 11.5–15.5)
WBC: 10.3 10*3/uL (ref 4.0–10.5)
nRBC: 0 % (ref 0.0–0.2)

## 2019-09-18 LAB — BASIC METABOLIC PANEL
Anion gap: 11 (ref 5–15)
BUN: 11 mg/dL (ref 6–20)
CO2: 21 mmol/L — ABNORMAL LOW (ref 22–32)
Calcium: 9.2 mg/dL (ref 8.9–10.3)
Chloride: 111 mmol/L (ref 98–111)
Creatinine, Ser: 1.05 mg/dL — ABNORMAL HIGH (ref 0.44–1.00)
GFR calc Af Amer: >60 mL/min (ref >60)
GFR calc non Af Amer: 60 mL/min — ABNORMAL LOW (ref >60)
Glucose, Bld: 98 mg/dL (ref 70–99)
Potassium: 4.2 mmol/L (ref 3.5–5.1)
Sodium: 143 mmol/L (ref 135–145)

## 2019-09-18 LAB — C-REACTIVE PROTEIN: CRP: 6.8 mg/dL — ABNORMAL HIGH (ref <1.0)

## 2019-09-18 MED ORDER — LEVOFLOXACIN 500 MG PO TABS: 500.0000 mg | ORAL_TABLET | Freq: Every day | ORAL | Status: DC

## 2019-09-18 MED ORDER — METOPROLOL TARTRATE 25 MG PO TABS: 25.0000 mg | ORAL_TABLET | Freq: Two times a day (BID) | ORAL | 2 refills | Status: DC

## 2019-09-18 MED ORDER — COLCHICINE 0.6 MG PO TABS: 0.6000 mg | ORAL_TABLET | Freq: Every day | ORAL | 1 refills | Status: DC

## 2019-09-18 MED ORDER — LEVOFLOXACIN 500 MG PO TABS: 500.0000 mg | ORAL_TABLET | Freq: Every day | ORAL | 0 refills | Status: DC

## 2019-09-18 MED ORDER — ASCORBIC ACID 250 MG PO TABS: 250.0000 mg | ORAL_TABLET | Freq: Every day | ORAL | 3 refills | Status: DC

## 2019-09-18 MED ORDER — FERROUS SULFATE 325 (65 FE) MG PO TABS: 325.0000 mg | ORAL_TABLET | Freq: Every day | ORAL | 3 refills | Status: DC

## 2019-09-18 MED ORDER — NITROGLYCERIN 0.4 MG SL SUBL: 0.4000 mg | SUBLINGUAL_TABLET | SUBLINGUAL | 1 refills | Status: DC | PRN

## 2019-09-18 MED ORDER — ASPIRIN 81 MG PO TBEC: 81.0000 mg | DELAYED_RELEASE_TABLET | Freq: Every day | ORAL | Status: DC

## 2019-09-18 NOTE — Discharge Summary (Signed)
Physician Discharge Summary  Patient ID: CLOTILE WHITTINGTON MRN: [id number] DOB/AGE: 56-Jul-1967 56 y.o.  Admit date: 09/14/2019 Discharge date: 09/18/2019  Admission Diagnoses: Acute coronary syndrome NSVT Asthma Panic attack R/O COVID-19 for leukocytosis, fever and elevated D-dimer Anemia, r/o iron deficiency Hyperglycemia r/o type 2 DM Acute renal failure, possible pre-renal azotemia  Discharge Diagnoses:  Principle Problem: Acute pericarditis with mild effusion, idiopathic Active Problems:   Acute Bronchitis   Acute pericarditis   CKD, II   NSVT   Asthma   Panic attack   Iron deficiency anemia   Mild hyperglycemia, pre-diabetic  Discharged Condition: good  Hospital Course: 54 years old white female presented with left sided under the breast area chest pain radiating to left side of jaw on and off x 3 days with fever and cough prior to admission. Her HS-troponin I levels were normal. She was treated with IV Rocephin and inhalers. Her NM myocardial perfusion stress test was also normal with 68 % EF. Her CT angio chest was negative for PE.  Her EKG showed early signs of pericarditis. Her echocardiogram showed normal LV systolic function but small circumferential pericardial effusion without tamponade. She was treated with colchicine. She avoids NSAIDS due to kidney damage in past.  CRP and ANA tests are pending. She also has iron deficiency anemia. She was discharged home in stable condition with f/u by primary care in 1 week and me in 2 weeks.  Consults: cardiology  Significant Diagnostic Studies: labs: WBC count 18.4 K, post antibiotic down to 10.3K. Hgb 9.7 to 11.2 gm.  Normal electrolytes, BUN normal, Creatinine 1.05 to 1.21 mg.  Normal uric acid level. Near normal Hgb A1C.  Iron level low at 25 mcg and 7 % saturation SARS Corona virus 2 by PCR negative  EKG: NSR, APCs and PR drop in inferior leads, suggestive of early pericarditis.  Chest x-ray : unremarkable.  CT angio  chest: No evidence of PE.  NM myocardial perfusion: No reversible ischemia and EF 68 %.  Echocardiogram: Good LV systolic function and small pericardial effusion without tamponade.  Treatments: antibiotics: ceftriaxone and Levaquin. Colchicine for pericarditis with effusion  Discharge Exam: Blood pressure 104/64, pulse 64, temperature 98.2 F (36.8 C), temperature source Oral, resp. rate 16, height 5\' 5"  (1.651 m), weight 92.7 kg, SpO2 98 %. General appearance: alert, cooperative and appears stated age. Head: Normocephalic, atraumatic. Eyes: Brown eyes, pink conjunctiva, corneas clear. PERRL, EOM's intact.  Neck: No adenopathy, no carotid bruit, no JVD, supple, symmetrical, trachea midline and thyroid not enlarged. Resp: Clear to auscultation bilaterally. Chest wall non-tender. Cardio: Regular rate and rhythm, S1, S2 normal, II/VI systolic murmur, no click, no rub or gallop. GI: Soft, non-tender; bowel sounds normal; no organomegaly. Extremities: No edema, cyanosis or clubbing. Skin: Warm and dry.  Neurologic: Alert and oriented X 3, normal strength and tone. Normal coordination and gait.  Disposition: Discharge disposition: 01-Home or Self Care        Allergies as of 09/18/2019   Not on File     Medication List    TAKE these medications   ALPRAZolam 0.25 MG tablet Commonly known as: XANAX Take 0.25 mg by mouth 3 (three) times daily as needed for anxiety.   ascorbic acid 250 MG tablet Commonly known as: VITAMIN C Take 1 tablet (250 mg total) by mouth daily with breakfast.   aspirin 81 MG EC tablet Take 1 tablet (81 mg total) by mouth daily.   Azelastine-Fluticasone 137-50 MCG/ACT Susp Place 1 spray  into both nostrils 2 (two) times daily.   colchicine 0.6 MG tablet Take 1 tablet (0.6 mg total) by mouth daily.   ferrous sulfate 325 (65 FE) MG tablet Take 1 tablet (325 mg total) by mouth daily with breakfast.   levalbuterol 45 MCG/ACT inhaler Commonly known as:  XOPENEX HFA Inhale 1 puff into the lungs every 4 (four) hours as needed.   levocetirizine 5 MG tablet Commonly known as: XYZAL Take 5 mg by mouth at bedtime.   levofloxacin 500 MG tablet Commonly known as: LEVAQUIN Take 1 tablet (500 mg total) by mouth daily.   metoprolol tartrate 25 MG tablet Commonly known as: LOPRESSOR Take 1 tablet (25 mg total) by mouth 2 (two) times daily.   montelukast 10 MG tablet Commonly known as: SINGULAIR Take 10 mg by mouth daily.   nitroGLYCERIN 0.4 MG SL tablet Commonly known as: NITROSTAT Place 1 tablet (0.4 mg total) under the tongue every 5 (five) minutes x 3 doses as needed for chest pain.   sertraline 100 MG tablet Commonly known as: ZOLOFT Take 100 mg by mouth daily.   Symbicort 160-4.5 MCG/ACT inhaler Generic drug: budesonide-formoterol Inhale 2 puffs into the lungs 2 (two) times daily.      Follow-up Information    [name], MD. Schedule an appointment as soon as possible for a visit in 1 week(s).   Specialty: Internal Medicine Contact information: [address] [place] [address] [phone]        Orpah Cobb, MD. Schedule an appointment as soon as possible for a visit in 2 week(s).   Specialty: Cardiology Contact information: [address] [place] [address] [phone]           Time spent: Review of old chart, current chart, lab, x-ray, cardiac tests and discussion with patient over 60 minutes.  Signed: Ricki Rodriguez 09/18/2019, 6:58 AM

## 2019-09-18 NOTE — Plan of Care (Signed)
Pt d/c to home, education complete, left facility via private vehicle. 

## 2019-09-19 LAB — ANTINUCLEAR ANTIBODIES, IFA: ANA Ab, IFA: NEGATIVE

## 2019-10-01 ENCOUNTER — Telehealth: Payer: Self-pay | Admitting: *Deleted

## 2019-10-01 NOTE — Telephone Encounter (Signed)
Requested to cancel 1st Covid19 vaccine for tomorrow. Has appointment scheduled for Saturday at [facility] in [place].

## 2019-10-02 ENCOUNTER — Inpatient Hospital Stay (HOSPITAL_COMMUNITY)
Admission: EM | Admit: 2019-10-02 | Discharge: 2019-10-07 | DRG: 316 | Disposition: A | Discharge status: Home / Self Care | Payer: BC Managed Care – PPO | Source: Ambulatory Visit | Attending: Cardiovascular Disease | Admitting: Cardiovascular Disease

## 2019-10-02 ENCOUNTER — Emergency Department (HOSPITAL_COMMUNITY): Payer: BC Managed Care – PPO

## 2019-10-02 ENCOUNTER — Ambulatory Visit: Payer: BC Managed Care – PPO

## 2019-10-02 DIAGNOSIS — J45909 Unspecified asthma, uncomplicated: Secondary | ICD-10-CM | POA: Diagnosis present

## 2019-10-02 DIAGNOSIS — F410 Panic disorder [episodic paroxysmal anxiety] without agoraphobia: Secondary | ICD-10-CM | POA: Diagnosis present

## 2019-10-02 DIAGNOSIS — R0602 Shortness of breath: Secondary | ICD-10-CM

## 2019-10-02 DIAGNOSIS — E669 Obesity, unspecified: Secondary | ICD-10-CM | POA: Diagnosis present

## 2019-10-02 DIAGNOSIS — I309 Acute pericarditis, unspecified: Secondary | ICD-10-CM | POA: Diagnosis not present

## 2019-10-02 DIAGNOSIS — Z7982 Long term (current) use of aspirin: Secondary | ICD-10-CM

## 2019-10-02 DIAGNOSIS — Z79890 Hormone replacement therapy (postmenopausal): Secondary | ICD-10-CM

## 2019-10-02 DIAGNOSIS — R9431 Abnormal electrocardiogram [ECG] [EKG]: Secondary | ICD-10-CM | POA: Diagnosis present

## 2019-10-02 DIAGNOSIS — I300 Acute nonspecific idiopathic pericarditis: Secondary | ICD-10-CM | POA: Diagnosis not present

## 2019-10-02 DIAGNOSIS — Z6833 Body mass index (BMI) 33.0-33.9, adult: Secondary | ICD-10-CM

## 2019-10-02 DIAGNOSIS — I319 Disease of pericardium, unspecified: Secondary | ICD-10-CM

## 2019-10-02 DIAGNOSIS — J209 Acute bronchitis, unspecified: Secondary | ICD-10-CM | POA: Diagnosis present

## 2019-10-02 DIAGNOSIS — D509 Iron deficiency anemia, unspecified: Secondary | ICD-10-CM | POA: Diagnosis present

## 2019-10-02 DIAGNOSIS — Z79899 Other long term (current) drug therapy: Secondary | ICD-10-CM

## 2019-10-02 DIAGNOSIS — I480 Paroxysmal atrial fibrillation: Secondary | ICD-10-CM | POA: Diagnosis present

## 2019-10-02 DIAGNOSIS — Z20822 Contact with and (suspected) exposure to covid-19: Secondary | ICD-10-CM | POA: Diagnosis present

## 2019-10-02 DIAGNOSIS — R739 Hyperglycemia, unspecified: Secondary | ICD-10-CM | POA: Diagnosis present

## 2019-10-02 HISTORY — DX: Disease of pericardium, unspecified: I31.9

## 2019-10-02 LAB — CBC
HCT: 38.6 % (ref 36.0–46.0)
Hemoglobin: 11.9 g/dL — ABNORMAL LOW (ref 12.0–15.0)
MCH: 24.9 pg — ABNORMAL LOW (ref 26.0–34.0)
MCHC: 30.8 g/dL (ref 30.0–36.0)
MCV: 80.8 fL (ref 80.0–100.0)
Platelets: 405 10*3/uL — ABNORMAL HIGH (ref 150–400)
RBC: 4.78 MIL/uL (ref 3.87–5.11)
RDW: 17.6 % — ABNORMAL HIGH (ref 11.5–15.5)
WBC: 21.4 10*3/uL — ABNORMAL HIGH (ref 4.0–10.5)
nRBC: 0 % (ref 0.0–0.2)

## 2019-10-02 LAB — BASIC METABOLIC PANEL
Anion gap: 12 (ref 5–15)
BUN: 10 mg/dL (ref 6–20)
CO2: 20 mmol/L — ABNORMAL LOW (ref 22–32)
Calcium: 9.1 mg/dL (ref 8.9–10.3)
Chloride: 104 mmol/L (ref 98–111)
Creatinine, Ser: 1 mg/dL (ref 0.44–1.00)
GFR calc Af Amer: >60 mL/min (ref >60)
GFR calc non Af Amer: >60 mL/min (ref >60)
Glucose, Bld: 110 mg/dL — ABNORMAL HIGH (ref 70–99)
Potassium: 4.1 mmol/L (ref 3.5–5.1)
Sodium: 136 mmol/L (ref 135–145)

## 2019-10-02 LAB — D-DIMER, QUANTITATIVE: D-Dimer, Quant: 10.79 ug/mL-FEU — ABNORMAL HIGH (ref 0.00–0.50)

## 2019-10-02 LAB — LACTIC ACID, PLASMA: Lactic Acid, Venous: 1.3 mmol/L (ref 0.5–1.9)

## 2019-10-02 LAB — POC SARS CORONAVIRUS 2 AG -  ED: SARS Coronavirus 2 Ag: NEGATIVE

## 2019-10-02 LAB — TROPONIN I (HIGH SENSITIVITY): Troponin I (High Sensitivity): <2 ng/L (ref <18)

## 2019-10-02 MED ORDER — SODIUM CHLORIDE 0.9% FLUSH
3.0000 mL | Freq: Two times a day (BID) | INTRAVENOUS | Status: DC
  Administered 2019-10-03 – 2019-10-06 (×7): 3 mL via INTRAVENOUS

## 2019-10-02 MED ORDER — SODIUM CHLORIDE 0.9 % IV SOLN
250.0000 mL | INTRAVENOUS | Status: DC | PRN
  Administered 2019-10-03: 250 mL via INTRAVENOUS

## 2019-10-02 MED ORDER — ACETAMINOPHEN 500 MG PO TABS
1000.0000 mg | ORAL_TABLET | Freq: Once | ORAL | Status: AC
  Administered 2019-10-02: 1000 mg via ORAL
  Filled 2019-10-02: qty 2

## 2019-10-02 MED ORDER — SODIUM CHLORIDE 0.9% FLUSH: 3.0000 mL | INTRAVENOUS | Status: DC | PRN

## 2019-10-02 MED ORDER — IOHEXOL 300 MG/ML  SOLN
100.0000 mL | Freq: Once | INTRAMUSCULAR | Status: AC | PRN
  Administered 2019-10-02: 100 mL via INTRAVENOUS

## 2019-10-02 MED ORDER — MORPHINE SULFATE (PF) 4 MG/ML IV SOLN
4.0000 mg | Freq: Once | INTRAVENOUS | Status: AC
  Administered 2019-10-02: 4 mg via INTRAVENOUS
  Filled 2019-10-02: qty 1

## 2019-10-02 NOTE — ED Triage Notes (Signed)
Per [facility] pt from PCP's office, WBC elevated, they sent her here for further evaluation. Recently dc'd from here and dx w/endocarditis  Pt c/o SOB starting Tuesday    BP 122/70 HR 102-104 RR 18-20 98% RA   20g LH

## 2019-10-02 NOTE — ED Notes (Signed)
Pt ambulated to bathroom with no assistance. Pt complaining of SOB and worsening pain, O2 at 98% upon returning to bed.

## 2019-10-02 NOTE — ED Notes (Signed)
Pt transported to CT ?

## 2019-10-02 NOTE — ED Notes (Signed)
Dr. Algie Coffer paged to PA request

## 2019-10-02 NOTE — ED Provider Notes (Signed)
[facility] EMERGENCY DEPARTMENT Provider Note   CSN: [id number] Arrival date & time: 10/02/19  1756     History Chief Complaint  Patient presents with  . Abnormal Lab    Bonnie Nelson is a 55 y.o. female.  Patient is a 54 year old female with past medical history of asthma presenting to the emergency department for chest pain and leukocytosis.  Patient had a recent admission for pericarditis in this hospital.  She was discharged on the 11th of this month.  She was treated with antibiotics and NSAIDs.  At that time she also had a low-grade fever as well as leukocytosis which normalized upon discharge.  Reports that she was feeling at her baseline upon discharge but over the last couple of days has begun to feel slightly worse.  Reports that she is now having a centralized chest pain and feels like her trachea is inflamed and has some pain radiating to the right side of the chest and neck which is improved with leaning forward and breathing moist, warm air.  Reports that this is different from her pericarditis pain.  Reports that overall she feels sick and had a temperature as high as 100.2 at home.  Saw her primary care doctor on Tuesday who repeated some labs and she was found again to have a leukocytosis of 22.  She denies any shortness of breath, nausea, vomiting        Past Medical History:  Diagnosis Date  . Asthma     Patient Active Problem List   Diagnosis Date Noted  . Acute pericarditis 09/15/2019  . Acute coronary syndrome (HCC) 09/14/2019    Past Surgical History:  Procedure Laterality Date  . NASAL SEPTUM SURGERY       OB History   No obstetric history on file.     No family history on file.  Social History   Tobacco Use  . Smoking status: Never Smoker  . Smokeless tobacco: Never Used  Substance Use Topics  . Alcohol use: Yes    Comment: OCCASIONAL  . Drug use: Never    Home Medications Prior to Admission medications   Medication  Sig Start Date End Date Taking? Authorizing Provider  ALPRAZolam (XANAX) 0.25 MG tablet Take 0.25 mg by mouth 3 (three) times daily as needed for anxiety. 04/08/19  Yes [provider]  ascorbic acid (VITAMIN C) 250 MG tablet Take 1 tablet (250 mg total) by mouth daily with breakfast. 09/18/19  Yes Orpah Cobb, MD  aspirin EC 81 MG EC tablet Take 1 tablet (81 mg total) by mouth daily. 09/18/19  Yes Orpah Cobb, MD  Azelastine-Fluticasone 137-50 MCG/ACT SUSP Place 1 spray into both nostrils 2 (two) times daily. 09/05/19  Yes [provider]  colchicine 0.6 MG tablet Take 1 tablet (0.6 mg total) by mouth daily. 09/18/19  Yes Orpah Cobb, MD  ferrous sulfate 325 (65 FE) MG tablet Take 1 tablet (325 mg total) by mouth daily with breakfast. 09/18/19  Yes Orpah Cobb, MD  levalbuterol (XOPENEX HFA) 45 MCG/ACT inhaler Inhale 1 puff into the lungs every 4 (four) hours as needed. 06/13/19  Yes [provider]  levocetirizine (XYZAL) 5 MG tablet Take 5 mg by mouth at bedtime. 09/03/19  Yes [provider]  metoprolol tartrate (LOPRESSOR) 25 MG tablet Take 1 tablet (25 mg total) by mouth 2 (two) times daily. Patient taking differently: Take 12.5 mg by mouth 2 (two) times daily.  09/18/19  Yes Orpah Cobb, MD  montelukast (SINGULAIR) 10 MG tablet Take 10 mg by mouth at bedtime.  09/03/19  Yes [provider]  nitroGLYCERIN (NITROSTAT) 0.4 MG SL tablet Place 1 tablet (0.4 mg total) under the tongue every 5 (five) minutes x 3 doses as needed for chest pain. 09/18/19  Yes Orpah Cobb, MD  sertraline (ZOLOFT) 100 MG tablet Take 100 mg by mouth daily. 07/21/19  Yes [provider]  SYMBICORT 160-4.5 MCG/ACT inhaler Inhale 2 puffs into the lungs 2 (two) times daily. 09/02/19  Yes [provider]  amoxicillin-clavulanate (AUGMENTIN) 875-125 MG tablet Take 1 tablet by mouth every 12 (twelve) hours.  09/29/19   [provider]    Allergies      Patient has no known allergies.  Review of Systems   Review of Systems  Constitutional: Positive for appetite change, fatigue and fever.  HENT: Positive for sore throat. Negative for congestion.   Eyes: Negative for visual disturbance.  Respiratory: Positive for cough and chest tightness. Negative for shortness of breath.   Cardiovascular: Positive for chest pain. Negative for palpitations and leg swelling.  Gastrointestinal: Negative for abdominal pain, nausea and vomiting.  Genitourinary: Negative for dysuria.  Musculoskeletal: Negative for back pain.  Skin: Negative for rash.  Allergic/Immunologic: Negative for immunocompromised state.  Neurological: Negative for dizziness, light-headedness and headaches.  Hematological: Does not bruise/bleed easily.  Psychiatric/Behavioral: Negative for confusion.  All other systems reviewed and are negative.   Physical Exam Updated Vital Signs BP 138/79   Pulse (!) 109   Temp (!) 101.6 F (38.7 C) (Oral)   Resp (!) 22   SpO2 96%   Physical Exam Vitals and nursing note reviewed.  Constitutional:      General: She is not in acute distress.    Appearance: Normal appearance. She is not ill-appearing, toxic-appearing or diaphoretic.  HENT:     Head: Normocephalic.     Nose: Nose normal.  Eyes:     Conjunctiva/sclera: Conjunctivae normal.  Cardiovascular:     Rate and Rhythm: Regular rhythm. Tachycardia present.     Comments: No friction rub appreciated Pulmonary:     Effort: Pulmonary effort is normal.     Breath sounds: Normal breath sounds.  Abdominal:     General: Abdomen is flat. Bowel sounds are normal.     Tenderness: There is no abdominal tenderness.  Musculoskeletal:     Right lower leg: No edema.     Left lower leg: No edema.  Skin:    General: Skin is warm and dry.     Capillary Refill: Capillary refill takes less than 2 seconds.  Neurological:     Mental Status: She is alert.  Psychiatric:        Mood and Affect:  Mood normal.     ED Results / Procedures / Treatments   Labs (all labs ordered are listed, but only abnormal results are displayed) Labs Reviewed  BASIC METABOLIC PANEL - Abnormal; Notable for the following components:      Result Value   CO2 20 (*)    Glucose, Bld 110 (*)    All other components within normal limits  CBC - Abnormal; Notable for the following components:   WBC 21.4 (*)    Hemoglobin 11.9 (*)    MCH 24.9 (*)    RDW 17.6 (*)    Platelets 405 (*)    All other components within normal limits  D-DIMER, QUANTITATIVE (NOT AT [facility]) - Abnormal; Notable for the following components:  D-Dimer, Quant 10.79 (*)    All other components within normal limits  CULTURE, BLOOD (ROUTINE X 2)  CULTURE, BLOOD (ROUTINE X 2)  SARS CORONAVIRUS 2 (TAT 6-24 HRS)  LACTIC ACID, PLASMA  URINALYSIS, ROUTINE W REFLEX MICROSCOPIC  POC SARS CORONAVIRUS 2 AG -  ED  TROPONIN I (HIGH SENSITIVITY)    EKG EKG Interpretation  Date/Time:  Thursday October 02 2019 18:12:22 EST Ventricular Rate:  102 PR Interval:    QRS Duration: 68 QT Interval:  459 QTC Calculation: 598 R Axis:   68 Text Interpretation: Sinus tachycardia Low voltage, precordial leads Borderline T abnormalities, anterior leads Prolonged QT interval No STEMI Confirmed by Octaviano Glow [phone]) on 10/02/2019 6:33:37 PM   Radiology CT Angio Chest PE W and/or Wo Contrast  Result Date: 10/02/2019 CLINICAL DATA:  Shortness of breath EXAM: CT ANGIOGRAPHY CHEST WITH CONTRAST TECHNIQUE: Multidetector CT imaging of the chest was performed using the standard protocol during bolus administration of intravenous contrast. Multiplanar CT image reconstructions and MIPs were obtained to evaluate the vascular anatomy. CONTRAST:  158mL OMNIPAQUE IOHEXOL 300 MG/ML  SOLN COMPARISON:  Chest x-ray 10/02/2019, CT 09/14/2019 FINDINGS: Cardiovascular: Satisfactory opacification of the pulmonary arteries to the segmental level. No evidence of pulmonary  embolism. Nonaneurysmal aorta. No dissection is seen. Heart size within normal limits. Moderate pericardial effusion measuring up to 2.8 cm in thickness on the left side. Mediastinum/Nodes: No enlarged mediastinal, hilar, or axillary lymph nodes. Thyroid gland, trachea, and esophagus demonstrate no significant findings. Lungs/Pleura: Small left-sided pleural effusion. No consolidation or pneumothorax Upper Abdomen: No acute abnormality. Musculoskeletal: No chest wall abnormality. No acute or significant osseous findings. Review of the MIP images confirms the above findings. IMPRESSION: 1. Negative for acute pulmonary embolus. 2. New moderate pericardial effusion, may correlate to history of pericarditis. 3. Small left-sided pleural effusion Electronically Signed   By: Donavan Foil M.D.   On: 10/02/2019 22:01   DG Chest Port 1 View  Result Date: 10/02/2019 CLINICAL DATA:  Pericarditis. Chest pain. EXAM: PORTABLE CHEST 1 VIEW COMPARISON:  Chest radiograph 09/28/2019, radiograph and CT 09/14/2019 FINDINGS: Enlargement of the cardiopericardial silhouette since prior exam. No pulmonary edema. Minimal streaky retrocardiac opacity. No pleural fluid or pneumothorax. No acute osseous abnormalities are seen. IMPRESSION: 1. Enlargement of the cardiopericardial silhouette since exam 3 days ago, this may represent pericardial effusion given provided history of pericarditis. Portable technique may accentuate heart size. 2. Streaky retrocardiac atelectasis. Electronically Signed   By: Keith Rake M.D.   On: 10/02/2019 19:24    Procedures Procedures (including critical care time)  Medications Ordered in ED Medications  iohexol (OMNIPAQUE) 300 MG/ML solution 100 mL (100 mLs Intravenous Contrast Given 10/02/19 2130)  morphine 4 MG/ML injection 4 mg (4 mg Intravenous Given 10/02/19 2249)  acetaminophen (TYLENOL) tablet 1,000 mg (1,000 mg Oral Given 10/02/19 2320)    ED Course  I have reviewed the triage vital  signs and the nursing notes.  Pertinent labs & imaging results that were available during my care of the patient were reviewed by me and considered in my medical decision making (see chart for details).  Clinical Course as of Oct 02 2339  Thu Oct 02, 2019  2257 Spoke with Dr. Doylene Canard who will admit the patient for pericarditis.Patient is stable, no concern for tamponade at this time.,    [KM]    Clinical Course User Index [KM] Kristine Royal   MDM Rules/Calculators/A&P  The patient appears reasonably stabilized for admission considering the current resources, flow, and capabilities available in the ED at this time, and I doubt any other [facility] requiring further screening and/or treatment in the ED prior to admission.  Final Clinical Impression(s) / ED Diagnoses Final diagnoses:  Pericarditis, unspecified chronicity, unspecified type    Rx / DC Orders ED Discharge Orders    None       Jeral Pinch 10/02/19 2342    Terald Sleeper, MD 10/03/19 [phone]

## 2019-10-02 NOTE — H&P (Signed)
Referring Physician: Dr. Linnell Fulling is an 54 y.o. female.                       Chief Complaint: Chest pain  HPI: 54 years old female with recent episode of acute pericarditis had shortness of breath and retrosternal burning type chest pain with elevated WBC count and mild fever. She feels chest pain was like trachea inflamed and was more with use of new inhaler Breo and resolved after discontinuation of inhaler and improved with inhalation of warm moist air. She has PMH of Asthma. Her CT scan of chest was negative for PE and positive for moderate pericardial effusion.  Past Medical History:  Diagnosis Date  . Asthma       Past Surgical History:  Procedure Laterality Date  . NASAL SEPTUM SURGERY      No family history on file. Social History:  reports that she has never smoked. She has never used smokeless tobacco. She reports current alcohol use. She reports that she does not use drugs.  Allergies: No Known Allergies  (Not in a hospital admission)   Results for orders placed or performed during the hospital encounter of 10/02/19 (from the past 48 hour(s))  Basic metabolic panel     Status: Abnormal   Collection Time: 10/02/19  6:54 PM  Result Value Ref Range   Sodium 136 135 - 145 mmol/L   Potassium 4.1 3.5 - 5.1 mmol/L    Comment: SLIGHT HEMOLYSIS   Chloride 104 98 - 111 mmol/L   CO2 20 (L) 22 - 32 mmol/L   Glucose, Bld 110 (H) 70 - 99 mg/dL    Comment: Glucose reference range applies only to samples taken after fasting for at least 8 hours.   BUN 10 6 - 20 mg/dL   Creatinine, Ser 1.00 0.44 - 1.00 mg/dL   Calcium 9.1 8.9 - 10.3 mg/dL   GFR calc non Af Amer >60 >60 mL/min   GFR calc Af Amer >60 >60 mL/min   Anion gap 12 5 - 15    Comment: Performed at [facility] [address], [place], [place] [address]  CBC     Status: Abnormal   Collection Time: 10/02/19  6:54 PM  Result Value Ref Range   WBC 21.4 (H) 4.0 - 10.5 K/uL   RBC 4.78 3.87 - 5.11 MIL/uL    Hemoglobin 11.9 (L) 12.0 - 15.0 g/dL   HCT 38.6 36.0 - 46.0 %   MCV 80.8 80.0 - 100.0 fL   MCH 24.9 (L) 26.0 - 34.0 pg   MCHC 30.8 30.0 - 36.0 g/dL   RDW 17.6 (H) 11.5 - 15.5 %   Platelets 405 (H) 150 - 400 K/uL   nRBC 0.0 0.0 - 0.2 %    Comment: Performed at [facility] [address] [place], [place] [address]  Troponin I (High Sensitivity)     Status: None   Collection Time: 10/02/19  6:54 PM  Result Value Ref Range   Troponin I (High Sensitivity) <2 <18 ng/L    Comment: (NOTE) Elevated high sensitivity troponin I (hsTnI) values and significant  changes across serial measurements may suggest ACS but many other  chronic and acute conditions are known to elevate hsTnI results.  Refer to the Links section for chest pain algorithms and additional  guidance. Performed at [facility], [place] [address], [place], [place] [address]   Lactic acid, plasma  Status: None   Collection Time: 10/02/19  6:54 PM  Result Value Ref Range   Lactic Acid, Venous 1.3 0.5 - 1.9 mmol/L    Comment: Performed at [facility], [place] [address], [place], [place] [address]  D-dimer, quantitative (not at [facility])     Status: Abnormal   Collection Time: 10/02/19  6:54 PM  Result Value Ref Range   D-Dimer, Quant 10.79 (H) 0.00 - 0.50 ug/mL-FEU    Comment: (NOTE) At the manufacturer cut-off of 0.50 ug/mL FEU, this assay has been documented to exclude PE with a sensitivity and negative predictive value of 97 to 99%.  At this time, this assay has not been approved by the FDA to exclude DVT/VTE. Results should be correlated with clinical presentation. Performed at [facility], [place] [address] [place], [place] [address]   POC SARS Coronavirus 2 Ag-ED - Nasal Swab (BD Veritor Kit)     Status: None   Collection Time: 10/02/19  7:08 PM  Result Value Ref Range   SARS Coronavirus 2 Ag NEGATIVE NEGATIVE    Comment: (NOTE) SARS-CoV-2 antigen NOT DETECTED.  Negative results are  presumptive.  Negative results do not preclude SARS-CoV-2 infection and should not be used as the sole basis for treatment or other patient management decisions, including infection  control decisions, particularly in the presence of clinical signs and  symptoms consistent with COVID-19, or in those who have been in contact with the virus.  Negative results must be combined with clinical observations, patient history, and epidemiological information. The expected result is Negative. Fact Sheet for Patients: PodPark.tn Fact Sheet for Healthcare Providers: GiftContent.is This test is not yet approved or cleared by the [place] FDA and  has been authorized for detection and/or diagnosis of SARS-CoV-2 by FDA under an Emergency Use Authorization (EUA).  This EUA will remain in effect (meaning this test can be used) for the duration of  the COVID-19 de claration under Section 564(b)(1) of the Act, 21 U.S.C. section 360bbb-3(b)(1), unless the authorization is terminated or revoked sooner.    CT Angio Chest PE W and/or Wo Contrast  Result Date: 10/02/2019 CLINICAL DATA:  Shortness of breath EXAM: CT ANGIOGRAPHY CHEST WITH CONTRAST TECHNIQUE: Multidetector CT imaging of the chest was performed using the standard protocol during bolus administration of intravenous contrast. Multiplanar CT image reconstructions and MIPs were obtained to evaluate the vascular anatomy. CONTRAST:  155m OMNIPAQUE IOHEXOL 300 MG/ML  SOLN COMPARISON:  Chest x-ray 10/02/2019, CT 09/14/2019 FINDINGS: Cardiovascular: Satisfactory opacification of the pulmonary arteries to the segmental level. No evidence of pulmonary embolism. Nonaneurysmal aorta. No dissection is seen. Heart size within normal limits. Moderate pericardial effusion measuring up to 2.8 cm in thickness on the left side. Mediastinum/Nodes: No enlarged mediastinal, hilar, or axillary lymph nodes. Thyroid  gland, trachea, and esophagus demonstrate no significant findings. Lungs/Pleura: Small left-sided pleural effusion. No consolidation or pneumothorax Upper Abdomen: No acute abnormality. Musculoskeletal: No chest wall abnormality. No acute or significant osseous findings. Review of the MIP images confirms the above findings. IMPRESSION: 1. Negative for acute pulmonary embolus. 2. New moderate pericardial effusion, may correlate to history of pericarditis. 3. Small left-sided pleural effusion Electronically Signed   By: KDonavan FoilM.D.   On: 10/02/2019 22:01   DG Chest Port 1 View  Result Date: 10/02/2019 CLINICAL DATA:  Pericarditis. Chest pain. EXAM: PORTABLE CHEST 1 VIEW COMPARISON:  Chest radiograph 09/28/2019, radiograph and CT 09/14/2019 FINDINGS: Enlargement of the cardiopericardial silhouette since  prior exam. No pulmonary edema. Minimal streaky retrocardiac opacity. No pleural fluid or pneumothorax. No acute osseous abnormalities are seen. IMPRESSION: 1. Enlargement of the cardiopericardial silhouette since exam 3 days ago, this may represent pericardial effusion given provided history of pericarditis. Portable technique may accentuate heart size. 2. Streaky retrocardiac atelectasis. Electronically Signed   By: Keith Rake M.D.   On: 10/02/2019 19:24    Review Of Systems Constitutional: Positive fever, chills, no weight loss or gain. Eyes: No vision change, wears glasses. No discharge or pain. Ears: No hearing loss, No tinnitus. Respiratory: No asthma, COPD, pneumonias. Positive shortness of breath. No hemoptysis. Cardiovascular: Positive chest pain, palpitation, no leg edema. Gastrointestinal: No nausea, vomiting, diarrhea, constipation. No GI bleed. No hepatitis. Genitourinary: No dysuria, hematuria, kidney stone. No incontinance. Neurological: No headache, stroke, seizures.  Psychiatry: No psych facility admission for anxiety, depression, suicide. No detox. Skin: No  rash. Musculoskeletal: No joint pain, Positive fibromyalgia. No neck pain, back pain. Lymphadenopathy: No lymphadenopathy. Hematology: No anemia or easy bruising.   Blood pressure 138/79, pulse (!) 109, temperature (!) 101.6 F (38.7 C), temperature source Oral, resp. rate (!) 22, SpO2 96 %. There is no height or weight on file to calculate BMI. General appearance: alert, cooperative, appears stated age and mild respiratory distress Head: Normocephalic, atraumatic. Eyes: Brown eyes, pink conjunctiva, corneas clear. PERRL, EOM's intact. Neck: No adenopathy, no carotid bruit, no JVD, supple, symmetrical, trachea midline and thyroid not enlarged. Resp: Clear to auscultation bilaterally. Cardio: Regular rate and rhythm, S1, S2 normal, II/VI systolic murmur, no click, rub or gallop GI: Soft, non-tender; bowel sounds normal; no organomegaly. Extremities: No edema, cyanosis or clubbing. Skin: Warm and dry.  Neurologic: Alert and oriented X 3, normal strength. Normal coordination and gait.  Assessment/Plan Acute pericarditis Asthma Iron deficiency anemia Mild hyperglycemia Acute bronchitis Panic attack  Start Low dose NSAID as tolerated  Continue colchicine IV antibiotics/Blood cultures Limited US of heart for possible pericardiocentesis  Time spent: Review of old records, Lab, x-rays, EKG, other cardiac tests, examination, discussion with patient over 70 minutes.  Birdie Riddle, MD  10/02/2019, 11:49 PM

## 2019-10-03 ENCOUNTER — Inpatient Hospital Stay (HOSPITAL_COMMUNITY)
Admission: EM | Disposition: A | Discharge status: Home / Self Care | Payer: Self-pay | Source: Ambulatory Visit | Attending: Cardiovascular Disease

## 2019-10-03 ENCOUNTER — Inpatient Hospital Stay (HOSPITAL_COMMUNITY): Payer: BC Managed Care – PPO

## 2019-10-03 ENCOUNTER — Encounter (HOSPITAL_COMMUNITY): Payer: Self-pay | Admitting: Cardiovascular Disease

## 2019-10-03 ENCOUNTER — Other Ambulatory Visit: Payer: Self-pay

## 2019-10-03 ENCOUNTER — Observation Stay (HOSPITAL_COMMUNITY): Payer: BC Managed Care – PPO

## 2019-10-03 DIAGNOSIS — Z20822 Contact with and (suspected) exposure to covid-19: Secondary | ICD-10-CM | POA: Diagnosis present

## 2019-10-03 DIAGNOSIS — I300 Acute nonspecific idiopathic pericarditis: Secondary | ICD-10-CM | POA: Diagnosis present

## 2019-10-03 DIAGNOSIS — I480 Paroxysmal atrial fibrillation: Secondary | ICD-10-CM | POA: Diagnosis present

## 2019-10-03 DIAGNOSIS — Z79899 Other long term (current) drug therapy: Secondary | ICD-10-CM | POA: Diagnosis not present

## 2019-10-03 DIAGNOSIS — I309 Acute pericarditis, unspecified: Secondary | ICD-10-CM | POA: Diagnosis present

## 2019-10-03 DIAGNOSIS — J45909 Unspecified asthma, uncomplicated: Secondary | ICD-10-CM | POA: Diagnosis present

## 2019-10-03 DIAGNOSIS — Z6833 Body mass index (BMI) 33.0-33.9, adult: Secondary | ICD-10-CM | POA: Diagnosis not present

## 2019-10-03 DIAGNOSIS — D509 Iron deficiency anemia, unspecified: Secondary | ICD-10-CM | POA: Diagnosis present

## 2019-10-03 DIAGNOSIS — R739 Hyperglycemia, unspecified: Secondary | ICD-10-CM | POA: Diagnosis present

## 2019-10-03 DIAGNOSIS — J209 Acute bronchitis, unspecified: Secondary | ICD-10-CM | POA: Diagnosis present

## 2019-10-03 DIAGNOSIS — E669 Obesity, unspecified: Secondary | ICD-10-CM | POA: Diagnosis present

## 2019-10-03 DIAGNOSIS — R9431 Abnormal electrocardiogram [ECG] [EKG]: Secondary | ICD-10-CM | POA: Diagnosis present

## 2019-10-03 DIAGNOSIS — Z79890 Hormone replacement therapy (postmenopausal): Secondary | ICD-10-CM | POA: Diagnosis not present

## 2019-10-03 DIAGNOSIS — Z7982 Long term (current) use of aspirin: Secondary | ICD-10-CM | POA: Diagnosis not present

## 2019-10-03 DIAGNOSIS — F410 Panic disorder [episodic paroxysmal anxiety] without agoraphobia: Secondary | ICD-10-CM | POA: Diagnosis present

## 2019-10-03 HISTORY — PX: PERICARDIAL FLUID DRAINAGE: SHX5100

## 2019-10-03 HISTORY — PX: PERICARDIOCENTESIS: CATH118255

## 2019-10-03 LAB — GRAM STAIN

## 2019-10-03 LAB — BASIC METABOLIC PANEL
Anion gap: 12 (ref 5–15)
BUN: 9 mg/dL (ref 6–20)
CO2: 20 mmol/L — ABNORMAL LOW (ref 22–32)
Calcium: 8.2 mg/dL — ABNORMAL LOW (ref 8.9–10.3)
Chloride: 103 mmol/L (ref 98–111)
Creatinine, Ser: 1.03 mg/dL — ABNORMAL HIGH (ref 0.44–1.00)
GFR calc Af Amer: >60 mL/min (ref >60)
GFR calc non Af Amer: >60 mL/min (ref >60)
Glucose, Bld: 108 mg/dL — ABNORMAL HIGH (ref 70–99)
Potassium: 4.6 mmol/L (ref 3.5–5.1)
Sodium: 135 mmol/L (ref 135–145)

## 2019-10-03 LAB — PROTIME-INR
INR: 1.2 (ref 0.8–1.2)
Prothrombin Time: 15.4 seconds — ABNORMAL HIGH (ref 11.4–15.2)

## 2019-10-03 LAB — BODY FLUID CELL COUNT WITH DIFFERENTIAL
Eos, Fluid: 0 %
Lymphs, Fluid: 5 %
Monocyte-Macrophage-Serous Fluid: 3 % — ABNORMAL LOW (ref 50–90)
Neutrophil Count, Fluid: 92 % — ABNORMAL HIGH (ref 0–25)
Total Nucleated Cell Count, Fluid: 720 cu mm (ref 0–1000)

## 2019-10-03 LAB — SARS CORONAVIRUS 2 (TAT 6-24 HRS): SARS Coronavirus 2: NEGATIVE

## 2019-10-03 LAB — URINALYSIS, ROUTINE W REFLEX MICROSCOPIC
Bilirubin Urine: NEGATIVE
Glucose, UA: NEGATIVE mg/dL
Hgb urine dipstick: NEGATIVE
Ketones, ur: 5 mg/dL — AB
Nitrite: NEGATIVE
Protein, ur: NEGATIVE mg/dL
Specific Gravity, Urine: 1.036 — ABNORMAL HIGH (ref 1.005–1.030)
pH: 6 (ref 5.0–8.0)

## 2019-10-03 LAB — CBC
HCT: 30.8 % — ABNORMAL LOW (ref 36.0–46.0)
Hemoglobin: 9.4 g/dL — ABNORMAL LOW (ref 12.0–15.0)
MCH: 25.1 pg — ABNORMAL LOW (ref 26.0–34.0)
MCHC: 30.5 g/dL (ref 30.0–36.0)
MCV: 82.1 fL (ref 80.0–100.0)
Platelets: 260 10*3/uL (ref 150–400)
RBC: 3.75 MIL/uL — ABNORMAL LOW (ref 3.87–5.11)
RDW: 17.7 % — ABNORMAL HIGH (ref 11.5–15.5)
WBC: 14.4 10*3/uL — ABNORMAL HIGH (ref 4.0–10.5)
nRBC: 0 % (ref 0.0–0.2)

## 2019-10-03 LAB — ECHOCARDIOGRAM LIMITED
Height: 65 in
Weight: 3280 oz

## 2019-10-03 LAB — MRSA PCR SCREENING: MRSA by PCR: NEGATIVE

## 2019-10-03 SURGERY — PERICARDIOCENTESIS
Anesthesia: LOCAL

## 2019-10-03 MED ORDER — LIDOCAINE HCL (PF) 1 % IJ SOLN
INTRAMUSCULAR | Status: AC
  Filled 2019-10-03: qty 30

## 2019-10-03 MED ORDER — LORATADINE 10 MG PO TABS
10.0000 mg | ORAL_TABLET | Freq: Every day | ORAL | Status: DC
  Administered 2019-10-03 – 2019-10-06 (×5): 10 mg via ORAL
  Filled 2019-10-03 (×5): qty 1

## 2019-10-03 MED ORDER — AZELASTINE HCL 0.1 % NA SOLN
1.0000 | Freq: Two times a day (BID) | NASAL | Status: DC
  Administered 2019-10-03 – 2019-10-07 (×8): 1 via NASAL
  Filled 2019-10-03: qty 30

## 2019-10-03 MED ORDER — METOPROLOL TARTRATE 5 MG/5ML IV SOLN
INTRAVENOUS | Status: AC
  Filled 2019-10-03: qty 5

## 2019-10-03 MED ORDER — FERROUS SULFATE 325 (65 FE) MG PO TABS
325.0000 mg | ORAL_TABLET | Freq: Every day | ORAL | Status: DC
  Administered 2019-10-04 – 2019-10-07 (×4): 325 mg via ORAL
  Filled 2019-10-03 (×4): qty 1

## 2019-10-03 MED ORDER — HYDROCODONE-ACETAMINOPHEN 5-325 MG PO TABS
1.0000 | ORAL_TABLET | Freq: Four times a day (QID) | ORAL | Status: DC | PRN
  Administered 2019-10-03 – 2019-10-07 (×7): 1 via ORAL
  Filled 2019-10-03 (×6): qty 1

## 2019-10-03 MED ORDER — MIDAZOLAM HCL 2 MG/2ML IJ SOLN
INTRAMUSCULAR | Status: DC | PRN
  Administered 2019-10-03 (×4): 1 mg via INTRAVENOUS

## 2019-10-03 MED ORDER — MONTELUKAST SODIUM 10 MG PO TABS
10.0000 mg | ORAL_TABLET | Freq: Every day | ORAL | Status: DC
  Administered 2019-10-03 – 2019-10-06 (×5): 10 mg via ORAL
  Filled 2019-10-03 (×6): qty 1

## 2019-10-03 MED ORDER — MIDAZOLAM HCL 2 MG/2ML IJ SOLN
INTRAMUSCULAR | Status: AC
  Filled 2019-10-03: qty 2

## 2019-10-03 MED ORDER — DILTIAZEM HCL-DEXTROSE 125-5 MG/125ML-% IV SOLN (PREMIX)
5.0000 mg/h | INTRAVENOUS | Status: DC
  Administered 2019-10-03 (×2): 15 mg/h via INTRAVENOUS
  Filled 2019-10-03 (×3): qty 125

## 2019-10-03 MED ORDER — SERTRALINE HCL 100 MG PO TABS
100.0000 mg | ORAL_TABLET | Freq: Every day | ORAL | Status: DC
  Administered 2019-10-03 – 2019-10-07 (×5): 100 mg via ORAL
  Filled 2019-10-03 (×5): qty 1

## 2019-10-03 MED ORDER — SODIUM CHLORIDE 0.9 % IV SOLN
INTRAVENOUS | Status: AC | PRN
  Administered 2019-10-03: 100 mL/h via INTRAVENOUS
  Administered 2019-10-03: 250 mL via INTRAVENOUS

## 2019-10-03 MED ORDER — FENTANYL CITRATE (PF) 100 MCG/2ML IJ SOLN
INTRAMUSCULAR | Status: DC | PRN
  Administered 2019-10-03 (×5): 25 ug via INTRAVENOUS

## 2019-10-03 MED ORDER — SODIUM CHLORIDE 0.9 % IV SOLN
1.0000 g | Freq: Every day | INTRAVENOUS | Status: DC
  Administered 2019-10-03 – 2019-10-06 (×5): 1 g via INTRAVENOUS
  Filled 2019-10-03: qty 10
  Filled 2019-10-03: qty 1
  Filled 2019-10-03: qty 10
  Filled 2019-10-03: qty 1
  Filled 2019-10-03: qty 10
  Filled 2019-10-03: qty 1

## 2019-10-03 MED ORDER — DEXTROSE 5 % IV SOLN
150.0000 mg | Freq: Once | INTRAVENOUS | Status: DC
  Filled 2019-10-03: qty 3

## 2019-10-03 MED ORDER — COLCHICINE 0.6 MG PO TABS: 0.6000 mg | ORAL_TABLET | Freq: Every day | ORAL | Status: DC

## 2019-10-03 MED ORDER — LEVALBUTEROL HCL 0.63 MG/3ML IN NEBU: 0.6300 mg | INHALATION_SOLUTION | RESPIRATORY_TRACT | Status: DC | PRN

## 2019-10-03 MED ORDER — ALPRAZOLAM 0.25 MG PO TABS
0.2500 mg | ORAL_TABLET | Freq: Three times a day (TID) | ORAL | Status: DC | PRN
  Administered 2019-10-03 (×2): 0.25 mg via ORAL
  Filled 2019-10-03 (×2): qty 1

## 2019-10-03 MED ORDER — NITROGLYCERIN 0.4 MG SL SUBL: 0.4000 mg | SUBLINGUAL_TABLET | SUBLINGUAL | Status: DC | PRN

## 2019-10-03 MED ORDER — ASPIRIN 325 MG PO TABS
650.0000 mg | ORAL_TABLET | Freq: Three times a day (TID) | ORAL | Status: DC
  Administered 2019-10-04 – 2019-10-07 (×11): 650 mg via ORAL
  Filled 2019-10-03 (×11): qty 2

## 2019-10-03 MED ORDER — METOPROLOL TARTRATE 12.5 MG HALF TABLET
12.5000 mg | ORAL_TABLET | Freq: Two times a day (BID) | ORAL | Status: DC
  Administered 2019-10-03 – 2019-10-07 (×10): 12.5 mg via ORAL
  Filled 2019-10-03 (×10): qty 1

## 2019-10-03 MED ORDER — MORPHINE SULFATE (PF) 2 MG/ML IV SOLN
1.0000 mg | INTRAVENOUS | Status: DC | PRN
  Administered 2019-10-03 – 2019-10-04 (×3): 1 mg via INTRAVENOUS
  Filled 2019-10-03 (×3): qty 1

## 2019-10-03 MED ORDER — CHLORHEXIDINE GLUCONATE CLOTH 2 % EX PADS
6.0000 | MEDICATED_PAD | Freq: Every day | CUTANEOUS | Status: DC
  Administered 2019-10-03 – 2019-10-06 (×4): 6 via TOPICAL

## 2019-10-03 MED ORDER — PANTOPRAZOLE SODIUM 40 MG PO TBEC
40.0000 mg | DELAYED_RELEASE_TABLET | Freq: Every day | ORAL | Status: DC
  Administered 2019-10-04 – 2019-10-07 (×4): 40 mg via ORAL
  Filled 2019-10-03 (×5): qty 1

## 2019-10-03 MED ORDER — COLCHICINE 0.6 MG PO TABS
0.6000 mg | ORAL_TABLET | Freq: Two times a day (BID) | ORAL | Status: DC
  Administered 2019-10-03 – 2019-10-07 (×10): 0.6 mg via ORAL
  Filled 2019-10-03 (×10): qty 1

## 2019-10-03 MED ORDER — GUAIFENESIN-DM 100-10 MG/5ML PO SYRP: 5.0000 mL | ORAL_SOLUTION | ORAL | Status: DC | PRN

## 2019-10-03 MED ORDER — ASCORBIC ACID 500 MG PO TABS
500.0000 mg | ORAL_TABLET | Freq: Every day | ORAL | Status: DC
  Administered 2019-10-04 – 2019-10-07 (×4): 500 mg via ORAL
  Filled 2019-10-03 (×4): qty 1

## 2019-10-03 MED ORDER — FLUTICASONE PROPIONATE 50 MCG/ACT NA SUSP
1.0000 | Freq: Two times a day (BID) | NASAL | Status: DC
  Administered 2019-10-03 – 2019-10-07 (×8): 1 via NASAL
  Filled 2019-10-03: qty 16

## 2019-10-03 MED ORDER — SODIUM CHLORIDE 0.9 % IV SOLN: INTRAVENOUS | Status: AC

## 2019-10-03 MED ORDER — HEPARIN (PORCINE) IN NACL 1000-0.9 UT/500ML-% IV SOLN
INTRAVENOUS | Status: AC
  Filled 2019-10-03: qty 500

## 2019-10-03 MED ORDER — HYDROCODONE-ACETAMINOPHEN 5-325 MG PO TABS
ORAL_TABLET | ORAL | Status: AC
  Filled 2019-10-03: qty 1

## 2019-10-03 MED ORDER — METOPROLOL TARTRATE 5 MG/5ML IV SOLN
5.0000 mg | Freq: Once | INTRAVENOUS | Status: AC
  Administered 2019-10-03: 5 mg via INTRAVENOUS

## 2019-10-03 MED ORDER — ALPRAZOLAM 0.25 MG PO TABS
ORAL_TABLET | ORAL | Status: AC
  Filled 2019-10-03: qty 1

## 2019-10-03 MED ORDER — MORPHINE SULFATE (PF) 2 MG/ML IV SOLN
2.0000 mg | Freq: Once | INTRAVENOUS | Status: AC
  Administered 2019-10-03: 2 mg via INTRAVENOUS
  Filled 2019-10-03: qty 1

## 2019-10-03 MED ORDER — FENTANYL CITRATE (PF) 100 MCG/2ML IJ SOLN
INTRAMUSCULAR | Status: AC
  Filled 2019-10-03: qty 2

## 2019-10-03 MED ORDER — LEVALBUTEROL TARTRATE 45 MCG/ACT IN AERO: 1.0000 | INHALATION_SPRAY | RESPIRATORY_TRACT | Status: DC | PRN

## 2019-10-03 MED ORDER — DILTIAZEM HCL 25 MG/5ML IV SOLN: 10.0000 mg | Freq: Once | INTRAVENOUS | Status: DC

## 2019-10-03 MED ORDER — AZELASTINE-FLUTICASONE 137-50 MCG/ACT NA SUSP: 1.0000 | Freq: Two times a day (BID) | NASAL | Status: DC

## 2019-10-03 MED ORDER — DILTIAZEM LOAD VIA INFUSION
10.0000 mg | Freq: Once | INTRAVENOUS | Status: AC
  Administered 2019-10-03: 10 mg via INTRAVENOUS
  Filled 2019-10-03: qty 10

## 2019-10-03 MED ORDER — ALBUTEROL SULFATE (2.5 MG/3ML) 0.083% IN NEBU
2.5000 mg | INHALATION_SOLUTION | Freq: Four times a day (QID) | RESPIRATORY_TRACT | Status: DC
  Administered 2019-10-03 (×3): 2.5 mg via RESPIRATORY_TRACT
  Filled 2019-10-03 (×3): qty 3

## 2019-10-03 SURGICAL SUPPLY — 3 items
PACK CARDIAC CATHETERIZATION (CUSTOM PROCEDURE TRAY) ×1 IMPLANT
PERIVAC PERICARDIOCENTESIS 8.3 (TRAY / TRAY PROCEDURE) ×1 IMPLANT
SHEATH PROBE COVER 6X72 (BAG) ×1 IMPLANT

## 2019-10-03 NOTE — Progress Notes (Addendum)
HR 150-180's rapid atrial fib. 12 lead EKG done. Patient feels the fast HR in her neck and chest. States it hurts to take a deep breath. Dr. Algie Coffer called and is aware. Dr. putting in orders. Pericardial tube  to [place] drain w/small amount pale yellow, pink tinted fluid. Placed on O2 2L [place]

## 2019-10-03 NOTE — ED Notes (Signed)
Echo at the bedside °

## 2019-10-03 NOTE — ED Notes (Signed)
Spoke to Dr. Algie Coffer in regards to pt increase in pain. New order to be placed. MD also notified of pt Hbg change from 11.9 to 9.4.

## 2019-10-03 NOTE — ED Notes (Signed)
Dr. Della Goo office called to page admitting to RN

## 2019-10-03 NOTE — Progress Notes (Signed)
Echocardiogram 2D Echocardiogram has been performed (during procedure).  Bonnie Nelson Bonnie Nelson 10/03/2019, 12:44 PM

## 2019-10-03 NOTE — Progress Notes (Signed)
PCXR done; Dr. Algie Coffer here to see it.

## 2019-10-03 NOTE — ED Notes (Signed)
Admitting paged to RN per her request 

## 2019-10-03 NOTE — ED Notes (Signed)
Pt ambulated to restroom with assistance. Pt noted to ambulate with a steady gait. Pt assisted back to bed, placed back on cardiac monitoring, and given warm blankets. Pt resting comfortably at this time.

## 2019-10-03 NOTE — ED Notes (Signed)
Pt complaining of back and chest pressure 5/10 that is similar to the pain she has been having with her pericarditis. MD paged

## 2019-10-03 NOTE — ED Notes (Signed)
Per MD Algie Coffer, okay to give PO morning medications.

## 2019-10-03 NOTE — ED Notes (Signed)
Pt O2 saturation 91-92%. Pt placed on 2L nasal cannula, O2 improved to 98%.

## 2019-10-03 NOTE — Progress Notes (Signed)
  Echocardiogram 2D Echocardiogram has been performed.  Bonnie Nelson 10/03/2019, 9:06 AM

## 2019-10-04 ENCOUNTER — Ambulatory Visit: Payer: BC Managed Care – PPO

## 2019-10-04 LAB — ACID FAST SMEAR (AFB, MYCOBACTERIA): Acid Fast Smear: NEGATIVE

## 2019-10-04 MED ORDER — SENNOSIDES-DOCUSATE SODIUM 8.6-50 MG PO TABS
2.0000 | ORAL_TABLET | Freq: Two times a day (BID) | ORAL | Status: DC | PRN
  Administered 2019-10-04 – 2019-10-05 (×3): 2 via ORAL
  Filled 2019-10-04 (×3): qty 2

## 2019-10-04 MED ORDER — SENNOSIDES-DOCUSATE SODIUM 8.6-50 MG PO TABS: 2.0000 | ORAL_TABLET | Freq: Two times a day (BID) | ORAL | Status: DC

## 2019-10-04 MED ORDER — DILTIAZEM HCL 60 MG PO TABS
30.0000 mg | ORAL_TABLET | Freq: Four times a day (QID) | ORAL | Status: DC
  Administered 2019-10-04 (×3): 30 mg via ORAL
  Filled 2019-10-04 (×3): qty 1

## 2019-10-04 NOTE — Progress Notes (Signed)
Subjective:  Patient complains of pleuritic chest pain and also pain at the pericardial catheter site.  States overall feels better.  Denies any shortness of breath.  Remains hemodynamically stable.  White count trending down.  No further episodes of A. fib remains on low-dose Cardizem drip.  Objective:  Vital Signs in the last 24 hours: Temp:  [98.3 F (36.8 C)-99.9 F (37.7 C)] 98.7 F (37.1 C) (02/27 0802) Pulse Rate:  [59-150] 76 (02/27 0800) Resp:  [9-39] 22 (02/27 0800) BP: (102-142)/(60-87) 119/66 (02/27 0800) SpO2:  [88 %-100 %] 90 % (02/27 0800) Weight:  [93 kg] 93 kg (02/26 1014)  Intake/Output from previous day: 02/26 0701 - 02/27 0700 In: 1307.4 [P.O.:240; I.V.:769.6; IV Piggyback:297.8] Out: 1190 [Urine:1175; Drains:15] Intake/Output from this shift: Total I/O In: 520 [P.O.:480; I.V.:40] Out: -   Physical Exam: Neck: no adenopathy, no carotid bruit, no JVD and supple, symmetrical, trachea midline Lungs: clear to auscultation bilaterally Heart: regular rate and rhythm, S1, S2 normal and 2/6 systolic murmur noted no pericardial rub appreciated Abdomen: soft, non-tender; bowel sounds normal; no masses,  no organomegaly Extremities: extremities normal, atraumatic, no cyanosis or edema  Lab Results: Recent Labs    10/02/19 1854 10/03/19 0534  WBC 21.4* 14.4*  HGB 11.9* 9.4*  PLT 405* 260   Recent Labs    10/02/19 1854 10/03/19 0534  NA 136 135  K 4.1 4.6  CL 104 103  CO2 20* 20*  GLUCOSE 110* 108*  BUN 10 9  CREATININE 1.00 1.03*   No results for input(s): TROPONINI in the last 72 hours.  Invalid input(s): CK, MB Hepatic Function Panel No results for input(s): PROT, ALBUMIN, AST, ALT, ALKPHOS, BILITOT, BILIDIR, IBILI in the last 72 hours. No results for input(s): CHOL in the last 72 hours. No results for input(s): PROTIME in the last 72 hours.  Imaging: Imaging results have been reviewed and CT Angio Chest PE W and/or Wo Contrast  Result Date:  10/02/2019 CLINICAL DATA:  Shortness of breath EXAM: CT ANGIOGRAPHY CHEST WITH CONTRAST TECHNIQUE: Multidetector CT imaging of the chest was performed using the standard protocol during bolus administration of intravenous contrast. Multiplanar CT image reconstructions and MIPs were obtained to evaluate the vascular anatomy. CONTRAST:  141mL OMNIPAQUE IOHEXOL 300 MG/ML  SOLN COMPARISON:  Chest x-ray 10/02/2019, CT 09/14/2019 FINDINGS: Cardiovascular: Satisfactory opacification of the pulmonary arteries to the segmental level. No evidence of pulmonary embolism. Nonaneurysmal aorta. No dissection is seen. Heart size within normal limits. Moderate pericardial effusion measuring up to 2.8 cm in thickness on the left side. Mediastinum/Nodes: No enlarged mediastinal, hilar, or axillary lymph nodes. Thyroid gland, trachea, and esophagus demonstrate no significant findings. Lungs/Pleura: Small left-sided pleural effusion. No consolidation or pneumothorax Upper Abdomen: No acute abnormality. Musculoskeletal: No chest wall abnormality. No acute or significant osseous findings. Review of the MIP images confirms the above findings. IMPRESSION: 1. Negative for acute pulmonary embolus. 2. New moderate pericardial effusion, may correlate to history of pericarditis. 3. Small left-sided pleural effusion Electronically Signed   By: Donavan Foil M.D.   On: 10/02/2019 22:01   CARDIAC CATHETERIZATION  Result Date: 10/03/2019 Successful placement of pericardial drain and 420 cc of straw colored effusion fluid sent for cell count, chemistry, cultures and stains.  DG Chest Port 1 View  Result Date: 10/02/2019 CLINICAL DATA:  Pericarditis. Chest pain. EXAM: PORTABLE CHEST 1 VIEW COMPARISON:  Chest radiograph 09/28/2019, radiograph and CT 09/14/2019 FINDINGS: Enlargement of the cardiopericardial silhouette since prior exam. No pulmonary  edema. Minimal streaky retrocardiac opacity. No pleural fluid or pneumothorax. No acute osseous  abnormalities are seen. IMPRESSION: 1. Enlargement of the cardiopericardial silhouette since exam 3 days ago, this may represent pericardial effusion given provided history of pericarditis. Portable technique may accentuate heart size. 2. Streaky retrocardiac atelectasis. Electronically Signed   By: Narda Rutherford M.D.   On: 10/02/2019 19:24   DG Chest Port 1V same Day  Result Date: 10/03/2019 CLINICAL DATA:  Shortness of breath, pericardial drainage tube EXAM: PORTABLE CHEST 1 VIEW COMPARISON:  10/02/2019 FINDINGS: Interval placement of pericardial drainage tube, projects over the cardiac silhouette. Unchanged cardiomegaly. No acute abnormality of the lungs in AP portable projection. IMPRESSION: Interval placement of pericardial drain tube, which projects over the cardiac silhouette. No acute abnormality of the lungs. Electronically Signed   By: Lauralyn Primes M.D.   On: 10/03/2019 15:11   ECHOCARDIOGRAM LIMITED  Result Date: 10/03/2019    ECHOCARDIOGRAM LIMITED REPORT   Patient Name:   Bonnie Nelson Date of Exam: 10/03/2019 Medical Rec #:  [id number]    Height:       65.0 in Accession #:    [id number]   Weight:       205.0 lb Date of Birth:  04/01/1966   BSA:          1.999 m Patient Age:    54 years     BP:           116/71 mmHg Patient Gender: F            HR:           99 bpm. Exam Location:  Inpatient Procedure: Limited Echo Indications:     I31.3 Pericardial effusion, pericardiocentesis  History:         Patient has prior history of Echocardiogram examinations, most                  recent 10/03/2019. CKD.  Sonographer:     Irving Burton Senior RDCS Referring Phys:  [id number] Orpah Cobb Diagnosing Phys: Orpah Cobb MD IMPRESSIONS  1. Left ventricular ejection fraction, by estimation, is 65 to 70%. The left ventricle has normal function. Left ventricular endocardial border not optimally defined to evaluate regional wall motion. Left ventricular diastolic function could not be evaluated.  2. Right ventricular  systolic function is normal.  3. The mitral valve is normal in structure and function.  4. The aortic valve was not well visualized. Comparison(s): Changes from prior study are noted. Conclusion(s)/Recommendation(s): Findings consistent with Post percardiocentesis, no tamponade. FINDINGS  Left Ventricle: Left ventricular ejection fraction, by estimation, is 65 to 70%. The left ventricle has normal function. Left ventricular endocardial border not optimally defined to evaluate regional wall motion. There is no left ventricular hypertrophy. Right Ventricle: Right ventricular systolic function is normal. Left Atrium: Left atrial size was normal in size. Right Atrium: Right atrial size was normal in size. Pericardium: Trivial pericardial effusion is present. The pericardial effusion is circumferential. There is no evidence of cardiac tamponade. Mitral Valve: The mitral valve is normal in structure and function. Tricuspid Valve: The tricuspid valve is normal in structure. Aortic Valve: The aortic valve was not well visualized. Pulmonic Valve: The pulmonic valve was not assessed. Aorta: The aortic root is normal in size and structure. Orpah Cobb MD Electronically signed by Orpah Cobb MD Signature Date/Time: 10/03/2019/1:56:58 PM    Final    ECHOCARDIOGRAM LIMITED  Result Date: 10/03/2019    ECHOCARDIOGRAM LIMITED REPORT  Patient Name:   Bonnie Nelson Date of Exam: 10/03/2019 Medical Rec #:  [id number]    Height:       65.0 in Accession #:    [id number]   Weight:       204.3 lb Date of Birth:  Dec 25, 1965   BSA:          1.996 m Patient Age:    54 years     BP:           135/90 mmHg Patient Gender: F            HR:           106 bpm. Exam Location:  Inpatient Procedure: Limited Echo Indications:     Pericardial effusion  History:         Patient has prior history of Echocardiogram examinations, most                  recent 09/15/2019. Pericardial Disease; Signs/Symptoms:Chest Pain                  and Shortness of  Breath. CKD.  Sonographer:     Lavenia Atlas RDCS Referring Phys:  [id number] Orpah Cobb Diagnosing Phys: Orpah Cobb MD IMPRESSIONS  1. Left ventricular ejection fraction by PLAX is 72 %. The left ventricle has normal function. Left ventricular endocardial border not optimally defined to evaluate regional wall motion. Left ventricular diastolic function could not be evaluated.  2. Right ventricular systolic function Mild diastolic collapse. The right ventricular size is normal.  3. Moderate pericardial effusion. The pericardial effusion is circumferential. Findings are consistent with cardiac tamponade.  4. The mitral valve is normal in structure and function.  5. The aortic valve is tricuspid.  6. Mild diastolic collapse. Comparison(s): Changes from prior study are noted. Conclusion(s)/Recommendation(s): Findings consistent with Pericardial effusion with early tamponade. FINDINGS  Left Ventricle: Left ventricular ejection fraction by PLAX is 72 % The left ventricle has normal function. Left ventricular endocardial border not optimally defined to evaluate regional wall motion. The left ventricular internal cavity size was normal in size. There is no left ventricular hypertrophy. Right Ventricle: The right ventricular size is normal. Right ventricular systolic function Mild diastolic collapse. Left Atrium: Left atrial size was normal in size. Right Atrium: Mild diastolic collapse. Pericardium: A moderately sized pericardial effusion is present. The pericardial effusion is circumferential. There is diastolic collapse of the right atrial wall and diastolic collapse of the right ventricular free wall. There is evidence of cardiac tamponade. Mitral Valve: The mitral valve is normal in structure and function. Tricuspid Valve: The tricuspid valve is normal in structure. Aortic Valve: The aortic valve is tricuspid. Pulmonic Valve: The pulmonic valve was not assessed. Aorta: The aortic root and ascending aorta are  structurally normal, with no evidence of dilitation. Additional Comments: There is no pleural effusion.  LEFT VENTRICLE PLAX 2D LV EF:         Left ventricular ejection fraction by PLAX is 72 % LVIDd:         3.50 cm LVIDs:         2.10 cm LV PW:         0.90 cm LV IVS:        0.90 cm  LEFT ATRIUM         Index LA diam:    2.70 cm 1.35 cm/m   AORTA Ao Root diam: 3.00 cm Orpah Cobb MD Electronically signed by Orpah Cobb MD Signature  Date/Time: 10/03/2019/1:52:27 PM    Final     Cardiac Studies:  Assessment/Plan:  Acute pericarditis with impending cardiac tamponade status post pericardiocentesis with placement of pigtail catheter into the pericardial space Asthma Iron deficiency anemia Mild hyperglycemia Acute bronchitis Panic attack Plan Continue present management Wean off IV Cardizem to p.o. as per orders We will repeat limited echo on Monday prior to removal of pericardial pigtail catheter. Continue colchicine as per orders May need NSAIDs versus steroids.  LOS: 1 day    Rinaldo Cloud 10/04/2019, 10:03 AM

## 2019-10-05 LAB — PHOSPHORUS: Phosphorus: 2.9 mg/dL (ref 2.5–4.6)

## 2019-10-05 LAB — BASIC METABOLIC PANEL
Anion gap: 10 (ref 5–15)
BUN: 13 mg/dL (ref 6–20)
CO2: 24 mmol/L (ref 22–32)
Calcium: 8.7 mg/dL — ABNORMAL LOW (ref 8.9–10.3)
Chloride: 105 mmol/L (ref 98–111)
Creatinine, Ser: 0.99 mg/dL (ref 0.44–1.00)
GFR calc Af Amer: >60 mL/min (ref >60)
GFR calc non Af Amer: >60 mL/min (ref >60)
Glucose, Bld: 105 mg/dL — ABNORMAL HIGH (ref 70–99)
Potassium: 4.1 mmol/L (ref 3.5–5.1)
Sodium: 139 mmol/L (ref 135–145)

## 2019-10-05 LAB — PROTEIN, BODY FLUID (OTHER): Total Protein, Body Fluid Other: 5.4 g/dL

## 2019-10-05 LAB — MAGNESIUM: Magnesium: 2 mg/dL (ref 1.7–2.4)

## 2019-10-05 LAB — GLUCOSE, BODY FLUID OTHER: Glucose, Body Fluid Other: 94 mg/dL

## 2019-10-05 MED ORDER — AMIODARONE HCL IN DEXTROSE 360-4.14 MG/200ML-% IV SOLN
INTRAVENOUS | Status: AC
  Filled 2019-10-05: qty 200

## 2019-10-05 MED ORDER — DILTIAZEM HCL 60 MG PO TABS
60.0000 mg | ORAL_TABLET | Freq: Four times a day (QID) | ORAL | Status: DC
  Administered 2019-10-05 – 2019-10-07 (×9): 60 mg via ORAL
  Filled 2019-10-05 (×9): qty 1

## 2019-10-05 MED ORDER — DILTIAZEM HCL-DEXTROSE 125-5 MG/125ML-% IV SOLN (PREMIX)
10.0000 mg/h | INTRAVENOUS | Status: DC
  Administered 2019-10-05: 5 mg/h via INTRAVENOUS
  Filled 2019-10-05 (×2): qty 125

## 2019-10-05 MED ORDER — DILTIAZEM HCL 60 MG PO TABS: 60.0000 mg | ORAL_TABLET | Freq: Four times a day (QID) | ORAL | Status: DC

## 2019-10-05 MED ORDER — ALUM & MAG HYDROXIDE-SIMETH 200-200-20 MG/5ML PO SUSP
30.0000 mL | ORAL | Status: DC | PRN
  Administered 2019-10-05: 30 mL via ORAL
  Filled 2019-10-05: qty 30

## 2019-10-05 MED ORDER — AMIODARONE LOAD VIA INFUSION
150.0000 mg | Freq: Once | INTRAVENOUS | Status: AC
  Administered 2019-10-05: 150 mg via INTRAVENOUS
  Filled 2019-10-05: qty 83.34

## 2019-10-05 NOTE — Progress Notes (Addendum)
Pt having rhythm changes and converting between atrial fib RVR (120s-130s bpm) and sinus rhythm with pauses, some as long as 5 seconds, then would brady down to 50s-40s bpm and return to A fib RVR  Patient symptomatic with dizziness and lightheadedness. BP and other vital signs remained stable.  MD made aware and ordered RN for diltiazem gtt at 10mg /hr.   0300: Patient is in NSR. VSS.

## 2019-10-05 NOTE — Progress Notes (Addendum)
Pt went into a fib in 120s, BP 117/83, patient asymptomatic. MD made aware and ordered 150mg  IV amio bolus and told RN to give a call back when bolus was finished.  While amio bolus was running, patient had moderate pauses in heart rhythm. Patient alert and oriented throughout the pauses and BP maintained stable.  Amio bolus finished with rates in low 100s.  MD made aware of updates and pauses. MD ordered diltiazem PO 60 mg every 6 hours.

## 2019-10-05 NOTE — Plan of Care (Signed)
  Problem: Coping: Goal: Level of anxiety will decrease 10/05/2019 2350 by Stacie Glaze, RN Outcome: Progressing 10/05/2019 2348 by Stacie Glaze, RN Outcome: Progressing   Problem: Elimination: Goal: Will not experience complications related to bowel motility 10/05/2019 2350 by Stacie Glaze, RN Outcome: Progressing 10/05/2019 2348 by Stacie Glaze, RN Outcome: Progressing   Problem: Safety: Goal: Ability to remain free from injury will improve 10/05/2019 2350 by Stacie Glaze, RN Outcome: Progressing 10/05/2019 2348 by Stacie Glaze, RN Outcome: Progressing

## 2019-10-05 NOTE — Progress Notes (Signed)
Subjective:  Patient denies any chest pain or shortness of breath had episode of paroxysmal A. fib and nonsustained SVT and occasional pauses last night requiring IV amiodarone.  Remains in sinus rhythm states overall feels better today.  Objective:  Vital Signs in the last 24 hours: Temp:  [97.9 F (36.6 C)-98.9 F (37.2 C)] 98.9 F (37.2 C) (02/28 0801) Pulse Rate:  [54-130] 81 (02/28 0900) Resp:  [14-28] 19 (02/28 0900) BP: (100-135)/(58-83) 135/72 (02/28 0900) SpO2:  [92 %-98 %] 97 % (02/28 0900) Weight:  [93.3 kg] 93.3 kg (02/28 0600)  Intake/Output from previous day: 02/27 0701 - 02/28 0700 In: 1226.2 [P.O.:920; I.V.:201.7; IV Piggyback:104.5] Out: 1270 [Urine:1250; Drains:20] Intake/Output from this shift: Total I/O In: 240 [P.O.:240] Out: -   Physical Exam: Neck: no adenopathy, no carotid bruit, no JVD and supple, symmetrical, trachea midline Lungs: clear to auscultation bilaterally Heart: regular rate and rhythm, S1, S2 normal and Soft systolic murmur noted no pericardial rub Abdomen: soft, non-tender; bowel sounds normal; no masses,  no organomegaly Extremities: extremities normal, atraumatic, no cyanosis or edema  Lab Results: Recent Labs    10/02/19 1854 10/03/19 0534  WBC 21.4* 14.4*  HGB 11.9* 9.4*  PLT 405* 260   Recent Labs    10/03/19 0534 10/05/19 0220  NA 135 139  K 4.6 4.1  CL 103 105  CO2 20* 24  GLUCOSE 108* 105*  BUN 9 13  CREATININE 1.03* 0.99   No results for input(s): TROPONINI in the last 72 hours.  Invalid input(s): CK, MB Hepatic Function Panel No results for input(s): PROT, ALBUMIN, AST, ALT, ALKPHOS, BILITOT, BILIDIR, IBILI in the last 72 hours. No results for input(s): CHOL in the last 72 hours. No results for input(s): PROTIME in the last 72 hours.  Imaging: Imaging results have been reviewed and CARDIAC CATHETERIZATION  Result Date: 10/03/2019 Successful placement of pericardial drain and 420 cc of straw colored effusion  fluid sent for cell count, chemistry, cultures and stains.  DG Chest Port 1V same Day  Result Date: 10/03/2019 CLINICAL DATA:  Shortness of breath, pericardial drainage tube EXAM: PORTABLE CHEST 1 VIEW COMPARISON:  10/02/2019 FINDINGS: Interval placement of pericardial drainage tube, projects over the cardiac silhouette. Unchanged cardiomegaly. No acute abnormality of the lungs in AP portable projection. IMPRESSION: Interval placement of pericardial drain tube, which projects over the cardiac silhouette. No acute abnormality of the lungs. Electronically Signed   By: Lauralyn Primes M.D.   On: 10/03/2019 15:11   ECHOCARDIOGRAM LIMITED  Result Date: 10/03/2019    ECHOCARDIOGRAM LIMITED REPORT   Patient Name:   Bonnie Nelson Date of Exam: 10/03/2019 Medical Rec #:  [id number]    Height:       65.0 in Accession #:    [id number]   Weight:       205.0 lb Date of Birth:  07-Nov-1965   BSA:          1.999 m Patient Age:    54 years     BP:           116/71 mmHg Patient Gender: F            HR:           99 bpm. Exam Location:  Inpatient Procedure: Limited Echo Indications:     I31.3 Pericardial effusion, pericardiocentesis  History:         Patient has prior history of Echocardiogram examinations, most  recent 10/03/2019. CKD.  Sonographer:     Raquel Sarna Senior RDCS Referring Phys:  [place] Diagnosing Phys: Dixie Dials MD IMPRESSIONS  1. Left ventricular ejection fraction, by estimation, is 65 to 70%. The left ventricle has normal function. Left ventricular endocardial border not optimally defined to evaluate regional wall motion. Left ventricular diastolic function could not be evaluated.  2. Right ventricular systolic function is normal.  3. The mitral valve is normal in structure and function.  4. The aortic valve was not well visualized. Comparison(s): Changes from prior study are noted. Conclusion(s)/Recommendation(s): Findings consistent with Post percardiocentesis, no tamponade. FINDINGS  Left  Ventricle: Left ventricular ejection fraction, by estimation, is 65 to 70%. The left ventricle has normal function. Left ventricular endocardial border not optimally defined to evaluate regional wall motion. There is no left ventricular hypertrophy. Right Ventricle: Right ventricular systolic function is normal. Left Atrium: Left atrial size was normal in size. Right Atrium: Right atrial size was normal in size. Pericardium: Trivial pericardial effusion is present. The pericardial effusion is circumferential. There is no evidence of cardiac tamponade. Mitral Valve: The mitral valve is normal in structure and function. Tricuspid Valve: The tricuspid valve is normal in structure. Aortic Valve: The aortic valve was not well visualized. Pulmonic Valve: The pulmonic valve was not assessed. Aorta: The aortic root is normal in size and structure. Dixie Dials MD Electronically signed by Dixie Dials MD Signature Date/Time: 10/03/2019/1:56:58 PM    Final     Cardiac Studies:  Assessment/Plan:  Acute pericarditis with impending cardiac tamponade status post pericardiocentesis with placement of pigtail catheter into the pericardial space Status post paroxysmal A. fib Asthma Iron deficiency anemia Mild hyperglycemia Acute bronchitis Panic attack Plan Continue present management We will consider short-term amiodarone if continues to have recurrent A. fib  LOS: 2 days    Charolette Forward 10/05/2019, 10:59 AM

## 2019-10-06 LAB — PH, BODY FLUID: pH, Body Fluid: 7.6

## 2019-10-06 LAB — CYTOLOGY - NON PAP

## 2019-10-06 MED FILL — Heparin Sod (Porcine)-NaCl IV Soln 1000 Unit/500ML-0.9%: INTRAVENOUS | Qty: 500 | Status: AC

## 2019-10-06 NOTE — Progress Notes (Signed)
Ref: Bonnie Cobb, MD   Subjective:  Feeling better. Pericardial drain is out. T max 99.3 degree F. Electrolytes and BUN/Cr are normal. Pericardial fluid: reactive mesothelial cells present. Culture-negative. Acid fast smear is negative.  Objective:  Vital Signs in the last 24 hours: Temp:  [97.5 F (36.4 C)-99.3 F (37.4 C)] 98.1 F (36.7 C) (03/01 1550) Pulse Rate:  [62-87] 65 (03/01 1600) Cardiac Rhythm: Normal sinus rhythm (03/01 1200) Resp:  [12-23] 22 (03/01 1600) BP: (91-136)/(53-81) 124/73 (03/01 1721) SpO2:  [92 %-100 %] 94 % (03/01 1600)  Physical Exam: BP Readings from Last 1 Encounters:  10/06/19 124/73     Wt Readings from Last 1 Encounters:  10/05/19 93.3 kg    Weight change:  Body mass index is 34.23 kg/m. HEENT: [place]/AT, Eyes-Brown, PERL, EOMI, Conjunctiva-Pink, Sclera-Non-icteric Neck: No JVD, No bruit, Trachea midline. Lungs:  Clear, Bilateral. Cardiac:  Regular rhythm, normal S1 and S2, no S3. II/VI systolic murmur. Positive rub in supine position. Abdomen:  Soft, non-tender. BS present. Extremities:  No edema present. No cyanosis. No clubbing. CNS: AxOx3, Cranial nerves grossly intact, moves all 4 extremities.  Skin: Warm and dry.   Intake/Output from previous day: 02/28 0701 - 03/01 0700 In: 952.1 [P.O.:840; I.V.:12; IV Piggyback:100.1] Out: 5 [Drains:5]    Lab Results: BMET    Component Value Date/Time   NA 139 10/05/2019 0220   NA 135 10/03/2019 0534   NA 136 10/02/2019 1854   K 4.1 10/05/2019 0220   K 4.6 10/03/2019 0534   K 4.1 10/02/2019 1854   CL 105 10/05/2019 0220   CL 103 10/03/2019 0534   CL 104 10/02/2019 1854   CO2 24 10/05/2019 0220   CO2 20 (L) 10/03/2019 0534   CO2 20 (L) 10/02/2019 1854   GLUCOSE 105 (H) 10/05/2019 0220   GLUCOSE 108 (H) 10/03/2019 0534   GLUCOSE 110 (H) 10/02/2019 1854   BUN 13 10/05/2019 0220   BUN 9 10/03/2019 0534   BUN 10 10/02/2019 1854   CREATININE 0.99 10/05/2019 0220   CREATININE 1.03 (H)  10/03/2019 0534   CREATININE 1.00 10/02/2019 1854   CALCIUM 8.7 (L) 10/05/2019 0220   CALCIUM 8.2 (L) 10/03/2019 0534   CALCIUM 9.1 10/02/2019 1854   GFRNONAA >60 10/05/2019 0220   GFRNONAA >60 10/03/2019 0534   GFRNONAA >60 10/02/2019 1854   GFRAA >60 10/05/2019 0220   GFRAA >60 10/03/2019 0534   GFRAA >60 10/02/2019 1854   CBC    Component Value Date/Time   WBC 14.4 (H) 10/03/2019 0534   RBC 3.75 (L) 10/03/2019 0534   HGB 9.4 (L) 10/03/2019 0534   HCT 30.8 (L) 10/03/2019 0534   PLT 260 10/03/2019 0534   MCV 82.1 10/03/2019 0534   MCH 25.1 (L) 10/03/2019 0534   MCHC 30.5 10/03/2019 0534   RDW 17.7 (H) 10/03/2019 0534   LYMPHSABS 3.1 09/18/2019 0357   MONOABS 0.9 09/18/2019 0357   EOSABS 0.5 09/18/2019 0357   BASOSABS 0.1 09/18/2019 0357   HEPATIC Function Panel Recent Labs    09/14/19 0910  PROT 6.8   HEMOGLOBIN A1C No components found for: HGA1C,  MPG CARDIAC ENZYMES No results found for: CKTOTAL, CKMB, CKMBINDEX, TROPONINI BNP No results for input(s): PROBNP in the last 8760 hours. TSH No results for input(s): TSH in the last 8760 hours. CHOLESTEROL Recent Labs    09/15/19 0241  CHOL 125    Scheduled Meds: . ascorbic acid  500 mg Oral Q breakfast  . aspirin  650 mg Oral TID WC  . azelastine  1 spray Each Nare BID   And  . fluticasone  1 spray Each Nare BID  . Chlorhexidine Gluconate Cloth  6 each Topical Daily  . colchicine  0.6 mg Oral BID  . diltiazem  60 mg Oral Q6H  . ferrous sulfate  325 mg Oral Q breakfast  . loratadine  10 mg Oral QHS  . metoprolol tartrate  12.5 mg Oral BID  . montelukast  10 mg Oral QHS  . pantoprazole  40 mg Oral Q1200  . sertraline  100 mg Oral Daily  . sodium chloride flush  3 mL Intravenous Q12H   Continuous Infusions: . sodium chloride Stopped (10/04/19 1027)  . cefTRIAXone (ROCEPHIN)  IV Stopped (10/05/19 2321)  . diltiazem (CARDIZEM) infusion Stopped (10/05/19 0811)   PRN Meds:.sodium chloride, ALPRAZolam,  alum & mag hydroxide-simeth, guaiFENesin-dextromethorphan, HYDROcodone-acetaminophen, levalbuterol, morphine injection, nitroGLYCERIN, senna-docusate, sodium chloride flush  Assessment/Plan: Acute pericarditis with impending cardiac tamponade S/P pericardiocentesis Removal of pericardial drain S/P Paroxysmal atrial fibrillation Asthma Iron deficiency anemia Mild hyperglycemia Acute bronchitis Panic attack Obesity  Recheck echocardiogram for pericardial effusion in AM Increase activity.   LOS: 3 days   Time spent including chart review, lab review, examination, discussion with patient : 30 min   Dixie Dials  MD  10/06/2019, 5:45 PM

## 2019-10-06 NOTE — Plan of Care (Signed)

## 2019-10-07 ENCOUNTER — Inpatient Hospital Stay (HOSPITAL_COMMUNITY): Payer: BC Managed Care – PPO

## 2019-10-07 LAB — CULTURE, BLOOD (ROUTINE X 2)
Culture: NO GROWTH
Culture: NO GROWTH

## 2019-10-07 LAB — ECHOCARDIOGRAM LIMITED
Height: 65 in
Weight: 3259.2 oz

## 2019-10-07 MED ORDER — METOPROLOL TARTRATE 25 MG PO TABS: 12.5000 mg | ORAL_TABLET | Freq: Two times a day (BID) | ORAL | Status: DC

## 2019-10-07 MED ORDER — PANTOPRAZOLE SODIUM 40 MG PO TBEC: 40.0000 mg | DELAYED_RELEASE_TABLET | Freq: Every day | ORAL | 1 refills | Status: DC

## 2019-10-07 MED ORDER — DILTIAZEM HCL 60 MG PO TABS: 120.0000 mg | ORAL_TABLET | Freq: Two times a day (BID) | ORAL | Status: DC

## 2019-10-07 MED ORDER — DILTIAZEM HCL 120 MG PO TABS: 120.0000 mg | ORAL_TABLET | Freq: Two times a day (BID) | ORAL | 1 refills | Status: DC

## 2019-10-07 MED ORDER — COLCHICINE 0.6 MG PO TABS: 0.6000 mg | ORAL_TABLET | Freq: Two times a day (BID) | ORAL | 1 refills | Status: DC

## 2019-10-07 MED ORDER — ASPIRIN 325 MG PO TABS: 650.0000 mg | ORAL_TABLET | Freq: Three times a day (TID) | ORAL | Status: DC

## 2019-10-07 NOTE — Progress Notes (Signed)
  Echocardiogram 2D Echocardiogram has been performed.  Bonnie Nelson Bath 10/07/2019, 10:57 AM

## 2019-10-07 NOTE — Discharge Summary (Signed)
Physician Discharge Summary  Patient ID: Bonnie Nelson MRN: [id number] DOB/AGE: 1966-03-21 54 y.o.  Admit date: 10/02/2019 Discharge date: 10/07/2019  Admission Diagnoses: Acute pericarditis Asthma Iron deficiency anemia Mild hyperglycemia Acute bronchitis Panic attack  Discharge Diagnoses:  Principle Problem: * Acute pericarditis, idiopathic with impending tamponade* Active problems: S/P pericardiocentesis S/P Paroxysmal atrial fibrillation, CHA2DS2VASc of 1 Asthma with bronchitis Iron deficiency anemia Mild hyperglycemia Obesity Panic attack  Discharged Condition: fair  Hospital Course: 54 years old white female with recent episode of acute pericarditis and shortness of breath and retosternal burning type chest pain with elevated WBC count and mild fever. CT scan of chest and echocardiogram showed increased pericardial effusion with impending tamponade.  She had emergent pericardiocentesis with drain for 72 hours till amount of fluid in drain bag was less than 5 ml per day and the drain tube was removed at that time. F/U echocardiogram showed no pericardial effusion. Pericardial fluid was exudative with elevated protein, high normal glucose, negative cultures and AFB stain, Positive reactive mesothelial cells. She was treated with high dose aspirin and colchicine and continue for 3-4 weeks with gradual taper. She will see me in 1 week and see primary doctor in 2-4 weeks.  Consults: cardiology  Significant Diagnostic Studies: labs: BMET near normal,  Elevated WBC count to 21.4 K down to 14.4K, Hgb 9.4 gm, Elevated Platelets to 405K down to 260 K next day.  Pericardial fluid showed elevated protein and high normal glucose levels. It was negative for cultures and AFB stain and positive for reactive mesothelial cells.  EKG: Atrial fibrillation with RVR and non-specific T wave changes.. F/U EKG showed normal sinus rhythm with non-specific T wave changes and minimal QT  prolongation.  CXR: Enlarged cardio-pericardial silhouette.   CT angio-chest Neg for PE , positive for moderate pericardial effsion and small ;eft sided pleural effusion.  Echocardiogram: Normal LV systolic function with moderate pericardial effusion and mild collapse of RA and RV suggesting impending tamponade. Repeat echocardiogram today : No pericardial effusion seen.  Treatments: cardiac meds: metoprolol, diltiazem and aspirin with colchicine and asthma medications.  Discharge Exam: Blood pressure 121/68, pulse 72, temperature 98.8 F (37.1 C), temperature source Oral, resp. rate (!) 22, height 5\' 5"  (1.651 m), weight 92.4 kg, SpO2 93 %. General appearance: alert, cooperative and appears stated age. Head: Normocephalic, atraumatic. Eyes: Brown eyes, pink conjunctiva, corneas clear. PERRL, EOM's intact.  Neck: No adenopathy, no carotid bruit, no JVD, supple, symmetrical, trachea midline and thyroid not enlarged. Resp: Clear to auscultation bilaterally. Cardio: Regular rate and rhythm, S1, S2 normal, II/VI systolic murmur, no click, rub or gallop. GI: Soft, non-tender; bowel sounds normal; no organomegaly. Extremities: No edema, cyanosis or clubbing. Skin: Warm and dry.  Neurologic: Alert and oriented X 3, normal strength and tone. Normal coordination and gait.  Disposition: Discharge disposition: 01-Home or Self Care        Allergies as of 10/07/2019   No Known Allergies     Medication List    STOP taking these medications   amoxicillin-clavulanate 875-125 MG tablet Commonly known as: AUGMENTIN   aspirin 81 MG EC tablet Replaced by: aspirin 325 MG tablet     TAKE these medications   ALPRAZolam 0.25 MG tablet Commonly known as: XANAX Take 0.25 mg by mouth 3 (three) times daily as needed for anxiety.   ascorbic acid 250 MG tablet Commonly known as: VITAMIN C Take 1 tablet (250 mg total) by mouth daily with breakfast.   aspirin 325  MG tablet Take 2 tablets (650  mg total) by mouth 3 (three) times daily with meals. Replaces: aspirin 81 MG EC tablet   Azelastine-Fluticasone 137-50 MCG/ACT Susp Place 1 spray into both nostrils 2 (two) times daily.   colchicine 0.6 MG tablet Take 1 tablet (0.6 mg total) by mouth 2 (two) times daily. What changed: when to take this   diltiazem 120 MG tablet Commonly known as: CARDIZEM Take 1 tablet (120 mg total) by mouth every 12 (twelve) hours.   ferrous sulfate 325 (65 FE) MG tablet Take 1 tablet (325 mg total) by mouth daily with breakfast.   levalbuterol 45 MCG/ACT inhaler Commonly known as: XOPENEX HFA Inhale 1 puff into the lungs every 4 (four) hours as needed.   levocetirizine 5 MG tablet Commonly known as: XYZAL Take 5 mg by mouth at bedtime.   metoprolol tartrate 25 MG tablet Commonly known as: LOPRESSOR Take 0.5 tablets (12.5 mg total) by mouth 2 (two) times daily.   montelukast 10 MG tablet Commonly known as: SINGULAIR Take 10 mg by mouth at bedtime.   nitroGLYCERIN 0.4 MG SL tablet Commonly known as: NITROSTAT Place 1 tablet (0.4 mg total) under the tongue every 5 (five) minutes x 3 doses as needed for chest pain.   pantoprazole 40 MG tablet Commonly known as: PROTONIX Take 1 tablet (40 mg total) by mouth daily at 12 noon.   sertraline 100 MG tablet Commonly known as: ZOLOFT Take 100 mg by mouth daily.   Symbicort 160-4.5 MCG/ACT inhaler Generic drug: budesonide-formoterol Inhale 2 puffs into the lungs 2 (two) times daily.      Follow-up Information    Dixie Dials, MD. Schedule an appointment as soon as possible for a visit in 1 week(s).   Specialty: Cardiology Contact information: [place] [address] [phone]           Time spent: Review of old chart, current chart, lab, x-ray, cardiac tests and discussion with patient over 60 minutes.  Signed: Birdie Riddle 10/07/2019, 12:02 PM

## 2019-10-08 LAB — CULTURE, BODY FLUID W GRAM STAIN -BOTTLE: Culture: NO GROWTH

## 2019-10-13 ENCOUNTER — Ambulatory Visit: Payer: BC Managed Care – PPO | Attending: Internal Medicine

## 2019-10-13 DIAGNOSIS — Z23 Encounter for immunization: Secondary | ICD-10-CM | POA: Insufficient documentation

## 2019-10-13 NOTE — Progress Notes (Signed)
   Covid-19 Vaccination Clinic  Name:  Bonnie Nelson    MRN: [id number] DOB: 1965-10-27  10/13/2019  [name] was observed post Covid-19 immunization for 15 minutes without incident. She was provided with Vaccine Information Sheet and instruction to access the V-Safe system.   [name] was instructed to call 911 with any severe reactions post vaccine: Marland Kitchen Difficulty breathing  . Swelling of face and throat  . A fast heartbeat  . A bad rash all over body  . Dizziness and weakness   Immunizations Administered    Name Date Dose VIS Date Route   Pfizer COVID-19 Vaccine 10/13/2019 11:03 AM 0.3 mL 07/18/2019 Intramuscular   Manufacturer: [employer], [employer]   Lot: [id number]   NDC: [address]-4

## 2019-11-12 ENCOUNTER — Ambulatory Visit: Payer: BC Managed Care – PPO | Attending: Internal Medicine

## 2019-11-12 DIAGNOSIS — Z23 Encounter for immunization: Secondary | ICD-10-CM

## 2019-11-12 NOTE — Progress Notes (Signed)
   Covid-19 Vaccination Clinic  Name:  Bonnie Nelson    MRN: [id number] DOB: [birth year]  11/12/2019  [name] was observed post Covid-19 immunization for 15 minutes without incident. She was provided with Vaccine Information Sheet and instruction to access the V-Safe system.   [name] was instructed to call 911 with any severe reactions post vaccine: Marland Kitchen Difficulty breathing  . Swelling of face and throat  . A fast heartbeat  . A bad rash all over body  . Dizziness and weakness   Immunizations Administered    Name Date Dose VIS Date Route   Pfizer COVID-19 Vaccine 11/12/2019 11:28 AM 0.3 mL 07/18/2019 Intramuscular   Manufacturer: [employer], [employer]   Lot: [id number]   NDC: [address]-0

## 2019-11-17 LAB — ACID FAST CULTURE WITH REFLEXED SENSITIVITIES (MYCOBACTERIA): Acid Fast Culture: NEGATIVE

## 2020-05-18 ENCOUNTER — Ambulatory Visit: Payer: BC Managed Care – PPO | Attending: Internal Medicine

## 2020-05-18 DIAGNOSIS — Z23 Encounter for immunization: Secondary | ICD-10-CM

## 2020-05-18 NOTE — Progress Notes (Signed)
   Covid-19 Vaccination Clinic  Name:  Bonnie Nelson    MRN: [id number] DOB: 11/08/1965  05/18/2020  Bonnie Nelson was observed post Covid-19 immunization for 15 minutes without incident. She was provided with Vaccine Information Sheet and instruction to access the V-Safe system.   Bonnie Nelson was instructed to call 911 with any severe reactions post vaccine: Marland Kitchen Difficulty breathing  . Swelling of face and throat  . A fast heartbeat  . A bad rash all over body  . Dizziness and weakness

## 2021-03-14 NOTE — Progress Notes (Signed)
Please enter orders for PAT visit scheduled for 03-17-21.  

## 2021-03-16 NOTE — Progress Notes (Addendum)
COVID Vaccine Completed: yes x3 Date COVID Vaccine completed: 10/13/19, 11/12/19 Has received booster: 05/18/20 COVID vaccine manufacturer: Pfizer    Date of COVID positive in last 90 days: No  PCP - Virgilio Belling Cardiologist - Orpah Cobb, MD  Working on clearance from cardiology  Chest x-ray - N/a EKG - 03/16/21 requested from Dr. Roseanne Kaufman office Stress Test - 09/15/19 Epic ECHO - 10/07/19 Epic Cardiac Cath - N/a Pacemaker/ICD device last checked: N/a Spinal Cord Stimulator: N/a  Sleep Study - N/a CPAP -   Fasting Blood Sugar - N/a Checks Blood Sugar _____ times a day  Blood Thinner Instructions: Aspirin Instructions: ASA 81mg  Last Dose: 03/16/21  Activity level: Can go up a flight of stairs and perform activities of daily living without stopping and without symptoms of chest pain or shortness of breath.     Anesthesia review:   Patient denies shortness of breath, fever, cough and chest pain at PAT appointment   Patient verbalized understanding of instructions that were given to them at the PAT appointment. Patient was also instructed that they will need to review over the PAT instructions again at home before surgery.

## 2021-03-16 NOTE — Patient Instructions (Addendum)
DUE TO COVID-19 ONLY ONE VISITOR IS ALLOWED TO COME WITH YOU AND STAY IN THE WAITING ROOM ONLY DURING PRE OP AND PROCEDURE.   **NO VISITORS ARE ALLOWED IN THE SHORT STAY AREA OR RECOVERY ROOM!!**       Your procedure is scheduled on: 03/22/21   Report to [facility] Main  Entrance    Report to admitting at 11:30 AM   Call this number if you have problems the morning of surgery [phone]   Do not eat food :After Midnight.   May have liquids until 10:30 AM day of surgery  CLEAR LIQUID DIET  Foods Allowed                                                                     Foods Excluded  Water, Black Coffee and tea, regular and decaf               liquids that you cannot  Plain Jell-O in any flavor  (No red)                                    see through such as: Fruit ices (not with fruit pulp)                                            milk, soups, orange juice              Iced Popsicles (No red)                                               All solid food                                   Apple juices Sports drinks like Gatorade (No red) Lightly seasoned clear broth or consume(fat free) Sugar, honey syrup      The day of surgery:  Drink ONE (1) Pre-Surgery Clear Ensure by 10:30 am the morning of surgery. Drink in one sitting. Do not sip.  This drink was given to you during your hospital  pre-op appointment visit. Nothing else to drink after completing the  Pre-Surgery Clear Ensure.          If you have questions, please contact your surgeon's office.     Oral Hygiene is also important to reduce your risk of infection.                                    Remember - BRUSH YOUR TEETH THE MORNING OF SURGERY WITH YOUR REGULAR TOOTHPASTE   Take these medicines the morning of surgery with A SIP OF WATER: Tylenol, Inhalers, Xanax, Clarinex, Diltiazem, Allegra, Metoprolol Tartate, Singulair, Pantoprazole, Zoloft, Augmentin  You may not have  any metal on your body including hair pins, jewelry, and body piercing             Do not wear make-up, lotions, powders, perfumes, or deodorant  Do not wear nail polish including gel and S&S, artificial/acrylic nails, or any other type of covering on natural nails including finger and toenails. If you have artificial nails, gel coating, etc. that needs to be removed by a nail salon please have this removed prior to surgery or surgery may need to be canceled/ delayed if the surgeon/ anesthesia feels like they are unable to be safely monitored.   Do not shave  48 hours prior to surgery.    Do not bring valuables to the hospital. [facility] IS NOT             RESPONSIBLE   FOR VALUABLES.    Patients discharged the day of surgery will not be allowed to drive home.  Special Instructions: Bring a copy of your healthcare power of attorney and living will documents         the day of surgery if you haven't scanned them in before.  Please read over the following fact sheets you were given: IF YOU HAVE QUESTIONS ABOUT YOUR PRE OP INSTRUCTIONS PLEASE CALL [phone]- [facility] - Preparing for Surgery Before surgery, you can play an important role.  Because skin is not sterile, your skin needs to be as free of germs as possible.  You can reduce the number of germs on your skin by washing with CHG (chlorahexidine gluconate) soap before surgery.  CHG is an antiseptic cleaner which kills germs and bonds with the skin to continue killing germs even after washing. Please DO NOT use if you have an allergy to CHG or antibacterial soaps.  If your skin becomes reddened/irritated stop using the CHG and inform your nurse when you arrive at Short Stay. Do not shave (including legs and underarms) for at least 48 hours prior to the first CHG shower.  You may shave your face/neck.  Please follow these instructions carefully:  1.  Shower with CHG Soap the night before surgery and the  morning of  surgery.  2.  If you choose to wash your hair, wash your hair first as usual with your normal  shampoo.  3.  After you shampoo, rinse your hair and body thoroughly to remove the shampoo.                             4.  Use CHG as you would any other liquid soap.  You can apply chg directly to the skin and wash.  Gently with a scrungie or clean washcloth.  5.  Apply the CHG Soap to your body ONLY FROM THE NECK DOWN.   Do   not use on face/ open                           Wound or open sores. Avoid contact with eyes, ears mouth and   genitals (private parts).                       Wash face,  Genitals (private parts) with your normal soap.             6.  Wash thoroughly, paying special attention to the area where your  surgery  will be performed.  7.  Thoroughly rinse your body with warm water from the neck down.  8.  DO NOT shower/wash with your normal soap after using and rinsing off the CHG Soap.                9.  Pat yourself dry with a clean towel.            10.  Wear clean pajamas.            11.  Place clean sheets on your bed the night of your first shower and do not  sleep with pets. Day of Surgery : Do not apply any lotions/deodorants the morning of surgery.  Please wear clean clothes to the hospital/surgery center.  FAILURE TO FOLLOW THESE INSTRUCTIONS MAY RESULT IN THE CANCELLATION OF YOUR SURGERY  PATIENT SIGNATURE_________________________________  NURSE SIGNATURE__________________________________  ________________________________________________________________________    Bonnie Nelson  An incentive spirometer is a tool that can help keep your lungs clear and active. This tool measures how well you are filling your lungs with each breath. Taking long deep breaths may help reverse or decrease the chance of developing breathing (pulmonary) problems (especially infection) following: A long period of time when you are unable to move or be active. BEFORE THE PROCEDURE   If the spirometer includes an indicator to show your best effort, your nurse or respiratory therapist will set it to a desired goal. If possible, sit up straight or lean slightly forward. Try not to slouch. Hold the incentive spirometer in an upright position. INSTRUCTIONS FOR USE  Sit on the edge of your bed if possible, or sit up as far as you can in bed or on a chair. Hold the incentive spirometer in an upright position. Breathe out normally. Place the mouthpiece in your mouth and seal your lips tightly around it. Breathe in slowly and as deeply as possible, raising the piston or the ball toward the top of the column. Hold your breath for 3-5 seconds or for as long as possible. Allow the piston or ball to fall to the bottom of the column. Remove the mouthpiece from your mouth and breathe out normally. Rest for a few seconds and repeat Steps 1 through 7 at least 10 times every 1-2 hours when you are awake. Take your time and take a few normal breaths between deep breaths. The spirometer may include an indicator to show your best effort. Use the indicator as a goal to work toward during each repetition. After each set of 10 deep breaths, practice coughing to be sure your lungs are clear. If you have an incision (the cut made at the time of surgery), support your incision when coughing by placing a pillow or rolled up towels firmly against it. Once you are able to get out of bed, walk around indoors and cough well. You may stop using the incentive spirometer when instructed by your caregiver.  RISKS AND COMPLICATIONS Take your time so you do not get dizzy or light-headed. If you are in pain, you may need to take or ask for pain medication before doing incentive spirometry. It is harder to take a deep breath if you are having pain. AFTER USE Rest and breathe slowly and easily. It can be helpful to keep track of a log of your progress. Your caregiver can provide you with a simple table to help  with this. If you are using the spirometer at home, follow these instructions: [facility] MEDICAL CARE  IF:  You are having difficultly using the spirometer. You have trouble using the spirometer as often as instructed. Your pain medication is not giving enough relief while using the spirometer. You develop fever of 100.5 F (38.1 C) or higher. SEEK IMMEDIATE MEDICAL CARE IF:  You cough up bloody sputum that had not been present before. You develop fever of 102 F (38.9 C) or greater. You develop worsening pain at or near the incision site. MAKE SURE YOU:  Understand these instructions. Will watch your condition. Will get help right away if you are not doing well or get worse. Document Released: 12/04/2006 Document Revised: 10/16/2011 Document Reviewed: 02/04/2007 [facility] Patient Information 2014 [place], [place].   ________________________________________________________________________

## 2021-03-17 ENCOUNTER — Encounter (HOSPITAL_COMMUNITY): Payer: Self-pay

## 2021-03-17 ENCOUNTER — Other Ambulatory Visit: Payer: Self-pay

## 2021-03-17 ENCOUNTER — Encounter (HOSPITAL_COMMUNITY)
Admission: RE | Admit: 2021-03-17 | Discharge: 2021-03-17 | Disposition: A | Discharge status: Home / Self Care | Payer: BC Managed Care – PPO | Source: Ambulatory Visit | Attending: Orthopedic Surgery | Admitting: Orthopedic Surgery

## 2021-03-17 DIAGNOSIS — Z01818 Encounter for other preprocedural examination: Secondary | ICD-10-CM | POA: Diagnosis present

## 2021-03-17 HISTORY — DX: Pneumonia, unspecified organism: J18.9

## 2021-03-17 HISTORY — DX: Anxiety disorder, unspecified: F41.9

## 2021-03-17 HISTORY — DX: Unspecified osteoarthritis, unspecified site: M19.90

## 2021-03-22 ENCOUNTER — Encounter (HOSPITAL_COMMUNITY)
Admission: RE | Disposition: A | Discharge status: Home / Self Care | Payer: Self-pay | Source: Home / Self Care | Attending: Orthopedic Surgery

## 2021-03-22 ENCOUNTER — Ambulatory Visit (HOSPITAL_COMMUNITY)
Admission: RE | Admit: 2021-03-22 | Discharge: 2021-03-22 | Disposition: A | Discharge status: Home / Self Care | Payer: BC Managed Care – PPO | Attending: Orthopedic Surgery | Admitting: Orthopedic Surgery

## 2021-03-22 ENCOUNTER — Ambulatory Visit (HOSPITAL_COMMUNITY): Payer: BC Managed Care – PPO | Admitting: Certified Registered"

## 2021-03-22 ENCOUNTER — Encounter (HOSPITAL_COMMUNITY): Payer: Self-pay | Admitting: Orthopedic Surgery

## 2021-03-22 DIAGNOSIS — Z7951 Long term (current) use of inhaled steroids: Secondary | ICD-10-CM | POA: Insufficient documentation

## 2021-03-22 DIAGNOSIS — S83241A Other tear of medial meniscus, current injury, right knee, initial encounter: Secondary | ICD-10-CM | POA: Insufficient documentation

## 2021-03-22 DIAGNOSIS — X58XXXA Exposure to other specified factors, initial encounter: Secondary | ICD-10-CM | POA: Insufficient documentation

## 2021-03-22 DIAGNOSIS — Z79899 Other long term (current) drug therapy: Secondary | ICD-10-CM | POA: Diagnosis not present

## 2021-03-22 DIAGNOSIS — S83281A Other tear of lateral meniscus, current injury, right knee, initial encounter: Secondary | ICD-10-CM | POA: Diagnosis not present

## 2021-03-22 HISTORY — PX: KNEE ARTHROSCOPY WITH MEDIAL MENISECTOMY: SHX5651

## 2021-03-22 SURGERY — ARTHROSCOPY, KNEE, WITH MEDIAL MENISCECTOMY
Anesthesia: General | Site: Knee | Laterality: Right

## 2021-03-22 MED ORDER — OXYCODONE HCL 5 MG/5ML PO SOLN: 5.0000 mg | Freq: Once | ORAL | Status: AC | PRN

## 2021-03-22 MED ORDER — CEFAZOLIN SODIUM-DEXTROSE 2-4 GM/100ML-% IV SOLN
2.0000 g | INTRAVENOUS | Status: AC
  Administered 2021-03-22: 2 g via INTRAVENOUS
  Filled 2021-03-22: qty 100

## 2021-03-22 MED ORDER — ONDANSETRON HCL 4 MG/2ML IJ SOLN
4.0000 mg | Freq: Once | INTRAMUSCULAR | Status: DC | PRN
Start: 2021-03-22 — End: 2021-03-22

## 2021-03-22 MED ORDER — PROPOFOL 1000 MG/100ML IV EMUL
INTRAVENOUS | Status: AC
  Filled 2021-03-22: qty 200

## 2021-03-22 MED ORDER — PROPOFOL 10 MG/ML IV BOLUS
INTRAVENOUS | Status: DC | PRN
  Administered 2021-03-22: 50 mg via INTRAVENOUS
  Administered 2021-03-22: 100 mg via INTRAVENOUS
  Administered 2021-03-22: 200 mg via INTRAVENOUS

## 2021-03-22 MED ORDER — HYDROCODONE-ACETAMINOPHEN 5-325 MG PO TABS: 1.0000 | ORAL_TABLET | Freq: Four times a day (QID) | ORAL | 0 refills | Status: DC | PRN

## 2021-03-22 MED ORDER — MIDAZOLAM HCL 2 MG/2ML IJ SOLN
INTRAMUSCULAR | Status: AC
  Filled 2021-03-22: qty 2

## 2021-03-22 MED ORDER — METOCLOPRAMIDE HCL 5 MG/ML IJ SOLN
INTRAMUSCULAR | Status: AC
  Filled 2021-03-22: qty 2

## 2021-03-22 MED ORDER — LIDOCAINE 2% (20 MG/ML) 5 ML SYRINGE
INTRAMUSCULAR | Status: AC
  Filled 2021-03-22: qty 5

## 2021-03-22 MED ORDER — LACTATED RINGERS IV SOLN: INTRAVENOUS | Status: DC

## 2021-03-22 MED ORDER — DEXAMETHASONE SODIUM PHOSPHATE 10 MG/ML IJ SOLN
INTRAMUSCULAR | Status: DC | PRN
  Administered 2021-03-22: 4 mg via INTRAVENOUS

## 2021-03-22 MED ORDER — ROCURONIUM BROMIDE 10 MG/ML (PF) SYRINGE
PREFILLED_SYRINGE | INTRAVENOUS | Status: AC
  Filled 2021-03-22: qty 10

## 2021-03-22 MED ORDER — FENTANYL CITRATE (PF) 100 MCG/2ML IJ SOLN
INTRAMUSCULAR | Status: DC | PRN
  Administered 2021-03-22: 100 ug via INTRAVENOUS

## 2021-03-22 MED ORDER — DEXAMETHASONE SODIUM PHOSPHATE 10 MG/ML IJ SOLN
INTRAMUSCULAR | Status: AC
  Filled 2021-03-22: qty 1

## 2021-03-22 MED ORDER — CHLORHEXIDINE GLUCONATE 0.12 % MT SOLN
15.0000 mL | Freq: Once | OROMUCOSAL | Status: AC
  Administered 2021-03-22: 15 mL via OROMUCOSAL

## 2021-03-22 MED ORDER — METOCLOPRAMIDE HCL 5 MG/ML IJ SOLN
INTRAMUSCULAR | Status: DC | PRN
  Administered 2021-03-22 (×3): 2 mg via INTRAVENOUS

## 2021-03-22 MED ORDER — ORAL CARE MOUTH RINSE: 15.0000 mL | Freq: Once | OROMUCOSAL | Status: AC

## 2021-03-22 MED ORDER — FENTANYL CITRATE (PF) 100 MCG/2ML IJ SOLN: 25.0000 ug | INTRAMUSCULAR | Status: DC | PRN

## 2021-03-22 MED ORDER — ONDANSETRON HCL 4 MG/2ML IJ SOLN
INTRAMUSCULAR | Status: AC
  Filled 2021-03-22: qty 2

## 2021-03-22 MED ORDER — OXYCODONE HCL 5 MG PO TABS
ORAL_TABLET | ORAL | Status: AC
  Filled 2021-03-22: qty 1

## 2021-03-22 MED ORDER — ONDANSETRON 4 MG PO TBDP: 4.0000 mg | ORAL_TABLET | Freq: Three times a day (TID) | ORAL | 0 refills | Status: DC | PRN

## 2021-03-22 MED ORDER — FENTANYL CITRATE (PF) 100 MCG/2ML IJ SOLN
INTRAMUSCULAR | Status: AC
  Filled 2021-03-22: qty 2

## 2021-03-22 MED ORDER — ONDANSETRON HCL 4 MG/2ML IJ SOLN
INTRAMUSCULAR | Status: DC | PRN
  Administered 2021-03-22: 4 mg via INTRAVENOUS

## 2021-03-22 MED ORDER — LIDOCAINE 2% (20 MG/ML) 5 ML SYRINGE
INTRAMUSCULAR | Status: DC | PRN
  Administered 2021-03-22: 60 mg via INTRAVENOUS

## 2021-03-22 MED ORDER — SUCCINYLCHOLINE CHLORIDE 200 MG/10ML IV SOSY
PREFILLED_SYRINGE | INTRAVENOUS | Status: AC
  Filled 2021-03-22: qty 10

## 2021-03-22 MED ORDER — MIDAZOLAM HCL 5 MG/5ML IJ SOLN
INTRAMUSCULAR | Status: DC | PRN
  Administered 2021-03-22: 2 mg via INTRAVENOUS

## 2021-03-22 MED ORDER — BUPIVACAINE HCL (PF) 0.25 % IJ SOLN
INTRAMUSCULAR | Status: AC
  Filled 2021-03-22: qty 30

## 2021-03-22 MED ORDER — BUPIVACAINE HCL 0.25 % IJ SOLN
INTRAMUSCULAR | Status: DC | PRN
  Administered 2021-03-22: 20 mL

## 2021-03-22 MED ORDER — OXYCODONE HCL 5 MG PO TABS
5.0000 mg | ORAL_TABLET | Freq: Once | ORAL | Status: AC | PRN
  Administered 2021-03-22: 5 mg via ORAL

## 2021-03-22 SURGICAL SUPPLY — 40 items
APL PRP STRL LF DISP 70% ISPRP (MISCELLANEOUS) ×1
BAG COUNTER SPONGE SURGICOUNT (BAG) ×2 IMPLANT
BAG SPNG CNTER NS LX DISP (BAG) ×1
BLADE SHAVER TORPEDO 4X13 (MISCELLANEOUS) ×2 IMPLANT
BLADE SURG SZ11 CARB STEEL (BLADE) ×2 IMPLANT
BNDG ELASTIC 6X5.8 VLCR STR LF (GAUZE/BANDAGES/DRESSINGS) ×2 IMPLANT
CHLORAPREP W/TINT 26 (MISCELLANEOUS) ×2 IMPLANT
COVER SURGICAL LIGHT HANDLE (MISCELLANEOUS) ×2 IMPLANT
CUFF TOURN SGL QUICK 34 (TOURNIQUET CUFF) ×2
CUFF TRNQT CYL 34X4.125X (TOURNIQUET CUFF) ×1 IMPLANT
DRAPE ARTHROSCOPY W/POUCH 114 (DRAPES) ×2 IMPLANT
DRAPE IMP U-DRAPE 54X76 (DRAPES) ×2 IMPLANT
DRAPE SHEET LG 3/4 BI-LAMINATE (DRAPES) IMPLANT
DRAPE U-SHAPE 47X51 STRL (DRAPES) ×2 IMPLANT
DRSG PAD ABDOMINAL 8X10 ST (GAUZE/BANDAGES/DRESSINGS) ×3 IMPLANT
GAUZE SPONGE 4X4 12PLY STRL (GAUZE/BANDAGES/DRESSINGS) ×2 IMPLANT
GAUZE XEROFORM 1X8 LF (GAUZE/BANDAGES/DRESSINGS) ×2 IMPLANT
GLOVE SRG 8 PF TXTR STRL LF DI (GLOVE) ×2 IMPLANT
GLOVE SURG ENC MOIS LTX SZ7.5 (GLOVE) ×4 IMPLANT
GLOVE SURG UNDER POLY LF SZ8 (GLOVE) ×4
GOWN STRL REUS W/TWL XL LVL3 (GOWN DISPOSABLE) ×4 IMPLANT
IV NS IRRIG 3000ML ARTHROMATIC (IV SOLUTION) ×4 IMPLANT
KIT BASIN OR (CUSTOM PROCEDURE TRAY) ×2 IMPLANT
KIT TURNOVER KIT A (KITS) ×2 IMPLANT
MANIFOLD NEPTUNE II (INSTRUMENTS) ×2 IMPLANT
PACK ARTHROSCOPY WL (CUSTOM PROCEDURE TRAY) ×2 IMPLANT
PADDING CAST ABS 6INX4YD NS (CAST SUPPLIES) ×1
PADDING CAST ABS COTTON 6X4 NS (CAST SUPPLIES) IMPLANT
PADDING CAST COTTON 6X4 STRL (CAST SUPPLIES) IMPLANT
PENCIL SMOKE EVACUATOR (MISCELLANEOUS) IMPLANT
PORT APPOLLO RF 90DEGREE MULTI (SURGICAL WAND) IMPLANT
PROBE HOOK APOLLO (SURGICAL WAND) IMPLANT
STRIP CLOSURE SKIN 1/2X4 (GAUZE/BANDAGES/DRESSINGS) ×1 IMPLANT
SUT ETHILON 4 0 PS 2 18 (SUTURE) ×2 IMPLANT
SYR CONTROL 10ML LL (SYRINGE) ×2 IMPLANT
TOWEL OR 17X26 10 PK STRL BLUE (TOWEL DISPOSABLE) ×2 IMPLANT
TUBING ARTHROSCOPY IRRIG 16FT (MISCELLANEOUS) ×2 IMPLANT
WAND APOLLORF SJ50 AR-9845 (SURGICAL WAND) IMPLANT
WATER STERILE IRR 500ML POUR (IV SOLUTION) ×2 IMPLANT
WRAP KNEE MAXI GEL POST OP (GAUZE/BANDAGES/DRESSINGS) ×2 IMPLANT

## 2021-03-22 NOTE — H&P (Signed)
ORTHOPAEDIC H and P  REQUESTING PHYSICIAN: Yolonda Kida, MD  PCP:  [name], MD  Chief Complaint: Right knee pain  HPI: Bonnie Nelson is a 55 y.o. female who complains of recalcitrant right knee pain following conservative care for medial lateral meniscus tears.  She is here today for right knee arthroscopy.  No new complaints.  Past Medical History:  Diagnosis Date   Anxiety    Arthritis    Asthma    Pericarditis    Pneumonia    Past Surgical History:  Procedure Laterality Date   achalasia     COLONOSCOPY     DILATION AND CURETTAGE OF UTERUS     NASAL SEPTUM SURGERY     PERICARDIAL FLUID DRAINAGE  10/03/2019   PERICARDIOCENTESIS N/A 10/03/2019   Procedure: PERICARDIOCENTESIS;  Surgeon: Orpah Cobb, MD;  Location: [facility] INVASIVE CV LAB;  Service: Cardiovascular;  Laterality: N/A;   Social History   Socioeconomic History   Marital status: Married    Spouse name: Not on file   Number of children: Not on file   Years of education: Not on file   Highest education level: Not on file  Occupational History   Occupation: teacher  Tobacco Use   Smoking status: Never   Smokeless tobacco: Never  Vaping Use   Vaping Use: Never used  Substance and Sexual Activity   Alcohol use: Yes    Alcohol/week: 3.0 standard drinks    Types: 3 Glasses of wine per week    Comment: OCCASIONAL   Drug use: Never   Sexual activity: Not on file  Other Topics Concern   Not on file  Social History Narrative   Not on file   Social Determinants of Health   Financial Resource Strain: Not on file  Food Insecurity: Not on file  Transportation Needs: Not on file  Physical Activity: Not on file  Stress: Not on file  Social Connections: Not on file   History reviewed. No pertinent family history. No Known Allergies Prior to Admission medications   Medication Sig Start Date End Date Taking? Authorizing Provider  albuterol (VENTOLIN HFA) 108 (90 Base) MCG/ACT inhaler Inhale 2  puffs into the lungs every 6 (six) hours as needed (Asthma).   Yes [provider]  ALPRAZolam (XANAX) 0.25 MG tablet Take 0.25 mg by mouth 3 (three) times daily as needed for anxiety. 04/08/19  Yes [provider]  amoxicillin-clavulanate (AUGMENTIN) 875-125 MG tablet Take 1 tablet by mouth 2 (two) times daily.   Yes [provider]  aspirin EC 81 MG tablet Take 81 mg by mouth daily. Swallow whole.   Yes [provider]  Azelastine-Fluticasone 137-50 MCG/ACT SUSP Place 1 spray into both nostrils 2 (two) times daily. Dymista 09/05/19  Yes [provider]  desloratadine (CLARINEX) 5 MG tablet Take 5 mg by mouth daily.   Yes [provider]  diclofenac Sodium (VOLTAREN) 1 % GEL Apply 2 g topically daily.   Yes [provider]  diltiazem (CARDIZEM) 120 MG tablet Take 1 tablet (120 mg total) by mouth every 12 (twelve) hours. 10/07/19  Yes Orpah Cobb, MD  fexofenadine (ALLEGRA) 180 MG tablet Take 180 mg by mouth daily.   Yes [provider]  ipratropium (ATROVENT) 0.06 % nasal spray Place 2 sprays into both nostrils daily as needed for allergies. 10/26/20  Yes [provider]  metoprolol tartrate (LOPRESSOR) 25 MG tablet Take 0.5 tablets (12.5 mg total) by mouth 2 (two) times daily.  10/07/19  Yes Orpah Cobb, MD  montelukast (SINGULAIR) 10 MG tablet Take 10 mg by mouth at bedtime.  09/03/19  Yes [provider]  pantoprazole (PROTONIX) 40 MG tablet Take 1 tablet (40 mg total) by mouth daily at 12 noon. Patient taking differently: Take 40 mg by mouth every other day. 10/07/19  Yes Orpah Cobb, MD  sertraline (ZOLOFT) 100 MG tablet Take 150 mg by mouth daily. 07/21/19  Yes [provider]  SYMBICORT 160-4.5 MCG/ACT inhaler Inhale 2 puffs into the lungs 2 (two) times daily. 09/02/19  Yes [provider]  acetaminophen (TYLENOL) 500 MG tablet Take 1,000 mg by mouth every 6 (six) hours as needed for mild pain  or moderate pain.    [provider]  ascorbic acid (VITAMIN C) 250 MG tablet Take 1 tablet (250 mg total) by mouth daily with breakfast. Patient not taking: Reported on 03/15/2021 09/18/19   Orpah Cobb, MD  aspirin 325 MG tablet Take 2 tablets (650 mg total) by mouth 3 (three) times daily with meals. Patient not taking: No sig reported 10/07/19   Orpah Cobb, MD  colchicine 0.6 MG tablet Take 1 tablet (0.6 mg total) by mouth 2 (two) times daily. 10/07/19   Orpah Cobb, MD  ferrous sulfate 325 (65 FE) MG tablet Take 1 tablet (325 mg total) by mouth daily with breakfast. Patient not taking: Reported on 03/15/2021 09/18/19   Orpah Cobb, MD  nitroGLYCERIN (NITROSTAT) 0.4 MG SL tablet Place 1 tablet (0.4 mg total) under the tongue every 5 (five) minutes x 3 doses as needed for chest pain. 09/18/19   Orpah Cobb, MD   No results found.  Positive ROS: All other systems have been reviewed and were otherwise negative with the exception of those mentioned in the HPI and as above.  Physical Exam: General: Alert, no acute distress Cardiovascular: No pedal edema Respiratory: No cyanosis, no use of accessory musculature GI: No organomegaly, abdomen is soft and non-tender Skin: No lesions in the area of chief complaint Neurologic: Sensation intact distally Psychiatric: Patient is competent for consent with normal mood and affect Lymphatic: No axillary or cervical lymphadenopathy  MUSCULOSKELETAL: Right lower extremity is warm and well-perfused.  No open wounds.  Neurovascular intact.  Assessment: 1.  Acute right medial meniscus tear complex.  2.  Acute right lateral meniscus tear complex.  Plan: -Plan proceed today with arthroscopic intervention on the right knee.  This will include partial medial partial lateral meniscectomies.  We again discussed the risk of bleeding, infection, damage to surrounding nerves and vessels, stiffness, DVT, risk of arthritis, as well as risk of anesthesia.   She has provided informed consent.  -Plan for discharge home postoperatively from PACU.    Yolonda Kida, MD Cell [phone]    03/22/2021 1:27 PM

## 2021-03-22 NOTE — Discharge Instructions (Addendum)

## 2021-03-22 NOTE — Anesthesia Procedure Notes (Signed)
Procedure Name: LMA Insertion Date/Time: 03/22/2021 1:58 PM Performed by: Donna Bernard, CRNA Pre-anesthesia Checklist: Patient identified, Emergency Drugs available, Suction available, Patient being monitored and Timeout performed Patient Re-evaluated:Patient Re-evaluated prior to induction Oxygen Delivery Method: Circle system utilized Preoxygenation: Pre-oxygenation with 100% oxygen Ventilation: Mask ventilation without difficulty LMA: LMA inserted and LMA with gastric port inserted LMA Size: 5.0 Number of attempts: 1 Tube secured with: Tape Dental Injury: Teeth and Oropharynx as per pre-operative assessment

## 2021-03-22 NOTE — Anesthesia Preprocedure Evaluation (Signed)
Anesthesia Evaluation  Patient identified by MRN, date of birth, ID band Patient awake    Reviewed: Allergy & Precautions, NPO status , Patient's Chart, lab work & pertinent test results, reviewed documented beta blocker date and time   Airway Mallampati: I  TM Distance: >3 FB Neck ROM: Full    Dental  (+) Teeth Intact, Dental Advisory Given   Pulmonary asthma , pneumonia, resolved,    Pulmonary exam normal breath sounds clear to auscultation       Cardiovascular negative cardio ROS Normal cardiovascular exam Rhythm:Regular Rate:Normal  Hx/o pericarditis 01/2020 recovered   Neuro/Psych Anxiety negative neurological ROS     GI/Hepatic negative GI ROS, Neg liver ROS,   Endo/Other  Obesity  Renal/GU negative Renal ROS  negative genitourinary   Musculoskeletal  (+) Arthritis , Osteoarthritis,  Torn medial and lateral meniscus   Abdominal (+) + obese,   Peds  Hematology negative hematology ROS (+)   Anesthesia Other Findings   Reproductive/Obstetrics                             Anesthesia Physical Anesthesia Plan  ASA: 2  Anesthesia Plan: General   Post-op Pain Management:    Induction: Intravenous  PONV Risk Score and Plan: 4 or greater and Treatment may vary due to age or medical condition, Midazolam and Ondansetron  Airway Management Planned: LMA  Additional Equipment:   Intra-op Plan:   Post-operative Plan: Extubation in OR  Informed Consent: I have reviewed the patients History and Physical, chart, labs and discussed the procedure including the risks, benefits and alternatives for the proposed anesthesia with the patient or authorized representative who has indicated his/her understanding and acceptance.     Dental advisory given  Plan Discussed with: CRNA and Anesthesiologist  Anesthesia Plan Comments:         Anesthesia Quick Evaluation

## 2021-03-22 NOTE — Brief Op Note (Signed)
03/22/2021  3:01 PM  PATIENT:  Judyann Munson  55 y.o. female  PRE-OPERATIVE DIAGNOSIS:  Right Knee medial and lateral meniscus tears  POST-OPERATIVE DIAGNOSIS:  Right Knee medial and lateral meniscus tears  PROCEDURE:  Procedure(s): KNEE ARTHROSCOPY WITH MEDIAL AND LATERAL  MENISECTOMY (Right)  SURGEON:  Surgeon(s) and Role:    * Yolonda Kida, MD - Primary  PHYSICIAN ASSISTANT: Dion Saucier, PA-C  ANESTHESIA:   general  EBL:  5 cc  BLOOD ADMINISTERED:none  DRAINS: none   LOCAL MEDICATIONS USED:  MARCAINE     SPECIMEN:  No Specimen  DISPOSITION OF SPECIMEN:  N/A  COUNTS:  YES  TOURNIQUET:  * Missing tourniquet times found for documented tourniquets in log: [id number] *  DICTATION: .Note written in EPIC  PLAN OF CARE: Discharge to home after PACU  PATIENT DISPOSITION:  PACU - hemodynamically stable.   Delay start of Pharmacological VTE agent (>24hrs) due to surgical blood loss or risk of bleeding: not applicable

## 2021-03-22 NOTE — Op Note (Signed)
Surgery Date: 03/22/2021  Surgeon(s): Yolonda Kida, MD  Assistant: Dion Saucier, PA-C  Assistant attestation:  PA Mcclung present for the entire procedure.  ANESTHESIA:  general  FLUIDS: Per anesthesia record.   ESTIMATED BLOOD LOSS: minimal  PREOPERATIVE DIAGNOSES:  1. Right knee lateral and medial meniscus tears 2.  Right knee synovitis 3.  Right knee lateral femoral condyle chondromalacia.  POSTOPERATIVE DIAGNOSES:  same  PROCEDURES PERFORMED:  1.  Right knee arthroscopy with limited synovectomy 2. Right knee arthroscopy with arthroscopic partial medial and lateral meniscectomies 3. Right knee arthroscopy with arthroscopic chondroplasty lateral femoral condyle and lateral tibial plateau.   DESCRIPTION OF PROCEDURE: Bonnie Nelson is a 55 y.o.-year-old female with right knee medial and lateral meniscus tear. Plans are to proceed with partial medial lateral meniscectomy and diagnostic arthroscopy with debridement as indicated. Full discussion held regarding risks benefits alternatives and complications related surgical intervention. Conservative care options reviewed. All questions answered.  The patient was identified in the preoperative holding area and the operative extremity was marked. The patient was brought to the operating room and transferred to operating table in a supine position. Satisfactory general anesthesia was induced by anesthesiology.    Standard anterolateral, anteromedial arthroscopy portals were obtained. The anteromedial portal was obtained with a spinal needle for localization under direct visualization with subsequent diagnostic findings.   Anteromedial and anterolateral chambers: moderate synovitis. The synovitis was debrided with a 4.5 mm full radius shaver through both the anteromedial and lateral portals.   Suprapatellar pouch and gutters: moderate synovitis or debris. Patella chondral surface: Grade 3 Trochlear chondral surface: Grade  4 Patellofemoral tracking: Midline and level Medial meniscus: Posterior horn radial tear adjacent to the root in the white red-white zone.  Medial femoral condyle flexion bearing surface: Grade 0 Medial femoral condyle extension bearing surface: Grade 0 Medial tibial plateau: Grade 1 Anterior cruciate ligament:stable Posterior cruciate ligament:stable Lateral meniscus: Mid body and posterior horn tearing of the white to red-white zone.  Horizontal morphology.   Lateral femoral condyle flexion bearing surface: Grade on the lateralmost edge of the extension surface grade III and IV chondromalacia Lateral femoral condyle extension bearing surface: Grade 3 Lateral tibial plateau: Grade 3  Partial medial lateral meniscectomies carried out the mid body and posterior horn combination of meniscal biter and rotary suction shaver.  Meniscectomy was carried back to stable tissue.  This was accomplished both medial lateral compartments.  Chondroplasty was accomplished with motorized shaver to debulk unstable flap tears of the grade 2, grade 3, and 4 areas of chondromalacia.  After completion of synovectomy, diagnostic exam, and debridements as described, all compartments were checked and no residual debris remained. Hemostasis was achieved with the cautery wand. The portals were approximated with buried monocryl. All excess fluid was expressed from the joint.  Xeroform sterile gauze dressings were applied followed by Ace bandage and ice pack.   DISPOSITION: The patient was awakened from general anesthetic, extubated, taken to the recovery room in medically stable condition, no apparent complications. The patient may be weightbearing as tolerated to the operative lower extremity.  Range of motion of right knee as tolerated.

## 2021-03-22 NOTE — Transfer of Care (Signed)
Immediate Anesthesia Transfer of Care Note  Patient: Bonnie Nelson  Procedure(s) Performed: KNEE ARTHROSCOPY WITH MEDIAL AND LATERAL  MENISECTOMY (Right: Knee)  Patient Location: PACU  Anesthesia Type:General  Level of Consciousness: drowsy  Airway & Oxygen Therapy: Patient Spontanous Breathing and Patient connected to face mask oxygen  Post-op Assessment: Report given to RN and Post -op Vital signs reviewed and stable  Post vital signs: Reviewed and stable  Last Vitals:  Vitals Value Taken Time  BP 120/70 03/22/21 1500  Temp 36.5 C 03/22/21 1500  Pulse 61 03/22/21 1511  Resp 8 03/22/21 1511  SpO2 100 % 03/22/21 1511  Vitals shown include unvalidated device data.  Last Pain:  Vitals:   03/22/21 1155  TempSrc: Oral  PainSc:       Patients Stated Pain Goal: 3 (03/22/21 1149)  Complications: No notable events documented.

## 2021-03-23 NOTE — Anesthesia Postprocedure Evaluation (Signed)
Anesthesia Post Note  Patient: Bonnie Nelson  Procedure(s) Performed: KNEE ARTHROSCOPY WITH MEDIAL AND LATERAL  MENISECTOMY (Right: Knee)     Patient location during evaluation: PACU Anesthesia Type: General Level of consciousness: awake and alert Pain management: pain level controlled Vital Signs Assessment: post-procedure vital signs reviewed and stable Respiratory status: spontaneous breathing, nonlabored ventilation and respiratory function stable Cardiovascular status: blood pressure returned to baseline and stable Postop Assessment: no apparent nausea or vomiting Anesthetic complications: no   No notable events documented.  Last Vitals:  Vitals:   03/22/21 1600 03/22/21 1613  BP: 134/69 130/76  Pulse: 64   Resp: 12 20  Temp:  36.6 C  SpO2: 99% 99%    Last Pain:  Vitals:   03/22/21 1613  TempSrc:   PainSc: 0-No pain                 Monee Dembeck,W. EDMOND

## 2021-03-24 ENCOUNTER — Encounter (HOSPITAL_COMMUNITY): Payer: Self-pay | Admitting: Orthopedic Surgery

## 2021-05-29 ENCOUNTER — Telehealth: Payer: BC Managed Care – PPO | Admitting: Family

## 2021-05-29 DIAGNOSIS — J019 Acute sinusitis, unspecified: Secondary | ICD-10-CM | POA: Diagnosis not present

## 2021-05-29 MED ORDER — AMOXICILLIN-POT CLAVULANATE 875-125 MG PO TABS: 1.0000 | ORAL_TABLET | Freq: Two times a day (BID) | ORAL | 0 refills | Status: DC

## 2021-05-29 NOTE — Progress Notes (Signed)
Virtual Visit Consent   Bonnie Nelson, you are scheduled for a virtual visit with a [facility] provider today.     Just as with appointments in the office, your consent must be obtained to participate.  Your consent will be active for this visit and any virtual visit you may have with one of our providers in the next 365 days.     If you have a MyChart account, a copy of this consent can be sent to you electronically.  All virtual visits are billed to your insurance company just like a traditional visit in the office.    As this is a virtual visit, video technology does not allow for your provider to perform a traditional examination.  This may limit your provider's ability to fully assess your condition.  If your provider identifies any concerns that need to be evaluated in person or the need to arrange testing (such as labs, EKG, etc.), we will make arrangements to do so.     Although advances in technology are sophisticated, we cannot ensure that it will always work on either your end or our end.  If the connection with a video visit is poor, the visit may have to be switched to a telephone visit.  With either a video or telephone visit, we are not always able to ensure that we have a secure connection.     I need to obtain your verbal consent now.   Are you willing to proceed with your visit today?    Teshara R Colvin has provided verbal consent on 05/29/2021 for a virtual visit (video or telephone).   Jannifer Rodney, FNP   Date: 05/29/2021 11:13 AM   Virtual Visit via Video Note   I, Jannifer Rodney, connected with  NATARSHA HURWITZ  ([id number], 11/05/1965) on 05/29/21 at 11:00 AM EDT by a video-enabled telemedicine application and verified that I am speaking with the correct person using two identifiers.  Location: Patient: Virtual Visit Location Patient: Home Provider: Virtual Visit Location Provider: Home   I discussed the limitations of evaluation and management by telemedicine and the  availability of in person appointments. The patient expressed understanding and agreed to proceed.    History of Present Illness: Bonnie Nelson is a 55 y.o. who identifies as a female who was assigned female at birth, and is being seen today for sinus issues. She tested positive for COVID 05/19/21. She started antiviral and was feeling better. She states her symptoms resolved and feeling better 05/24/21-05/25/21. Then she started feeling fatigue, sinus pressure, nasal congestion, and tonsil pain.   HPI: Sinusitis This is a new problem. The current episode started 1 to 4 weeks ago. The problem has been gradually worsening since onset. There has been no fever. Her pain is at a severity of 2/10. The pain is mild. Associated symptoms include congestion, headaches, a hoarse voice, sinus pressure, sneezing and a sore throat. Pertinent negatives include no coughing, ear pain or shortness of breath. Past treatments include oral decongestants. The treatment provided mild relief.   Problems:  Patient Active Problem List   Diagnosis Date Noted   Pericarditis, acute, idiopathic 10/03/2019   Acute pericarditis 09/15/2019   Acute coronary syndrome (HCC) 09/14/2019    Allergies: No Known Allergies Medications:  Current Outpatient Medications:    amoxicillin-clavulanate (AUGMENTIN) 875-125 MG tablet, Take 1 tablet by mouth 2 (two) times daily., Disp: 14 tablet, Rfl: 0   acetaminophen (TYLENOL) 500 MG tablet, Take 1,000 mg by mouth  every 6 (six) hours as needed for mild pain or moderate pain., Disp: , Rfl:    albuterol (VENTOLIN HFA) 108 (90 Base) MCG/ACT inhaler, Inhale 2 puffs into the lungs every 6 (six) hours as needed (Asthma)., Disp: , Rfl:    ALPRAZolam (XANAX) 0.25 MG tablet, Take 0.25 mg by mouth 3 (three) times daily as needed for anxiety., Disp: , Rfl:    aspirin EC 81 MG tablet, Take 81 mg by mouth daily. Swallow whole., Disp: , Rfl:    Azelastine-Fluticasone 137-50 MCG/ACT SUSP, Place 1 spray into  both nostrils 2 (two) times daily. Dymista, Disp: , Rfl:    desloratadine (CLARINEX) 5 MG tablet, Take 5 mg by mouth daily., Disp: , Rfl:    diclofenac Sodium (VOLTAREN) 1 % GEL, Apply 2 g topically daily., Disp: , Rfl:    diltiazem (CARDIZEM) 120 MG tablet, Take 1 tablet (120 mg total) by mouth every 12 (twelve) hours., Disp: 60 tablet, Rfl: 1   fexofenadine (ALLEGRA) 180 MG tablet, Take 180 mg by mouth daily., Disp: , Rfl:    ipratropium (ATROVENT) 0.06 % nasal spray, Place 2 sprays into both nostrils daily as needed for allergies., Disp: , Rfl:    metoprolol tartrate (LOPRESSOR) 25 MG tablet, Take 0.5 tablets (12.5 mg total) by mouth 2 (two) times daily., Disp: , Rfl:    montelukast (SINGULAIR) 10 MG tablet, Take 10 mg by mouth at bedtime. , Disp: , Rfl:    nitroGLYCERIN (NITROSTAT) 0.4 MG SL tablet, Place 1 tablet (0.4 mg total) under the tongue every 5 (five) minutes x 3 doses as needed for chest pain., Disp: 25 tablet, Rfl: 1   pantoprazole (PROTONIX) 40 MG tablet, Take 1 tablet (40 mg total) by mouth daily at 12 noon. (Patient taking differently: Take 40 mg by mouth every other day.), Disp: 30 tablet, Rfl: 1   sertraline (ZOLOFT) 100 MG tablet, Take 150 mg by mouth daily., Disp: , Rfl:    SYMBICORT 160-4.5 MCG/ACT inhaler, Inhale 2 puffs into the lungs 2 (two) times daily., Disp: , Rfl:   Observations/Objective: Patient is well-developed, well-nourished in no acute distress.  Resting comfortably  at home.  Head is normocephalic, atraumatic.  No labored breathing.  Speech is clear and coherent with logical content.  Patient is alert and oriented at baseline.  Nasal congestion   Assessment and Plan: 1. Acute sinusitis, recurrence not specified, unspecified location - amoxicillin-clavulanate (AUGMENTIN) 875-125 MG tablet; Take 1 tablet by mouth 2 (two) times daily.  Dispense: 14 tablet; Refill: 0 - Take meds as prescribed - Use a cool mist humidifier  -Use saline nose sprays  frequently -Force fluids -For any cough or congestion  Use plain Mucinex- regular strength or max strength is fine -For fever or aces or pains- take tylenol or ibuprofen. -Throat lozenges if help Follow up if symptoms worsen or do not improve   Follow Up Instructions: I discussed the assessment and treatment plan with the patient. The patient was provided an opportunity to ask questions and all were answered. The patient agreed with the plan and demonstrated an understanding of the instructions.  A copy of instructions were sent to the patient via MyChart unless otherwise noted below.     The patient was advised to call back or seek an in-person evaluation if the symptoms worsen or if the condition fails to improve as anticipated.  Time:  I spent 8 minutes with the patient via telehealth technology discussing the above problems/concerns.    [employer]  Lenna Gilford, [place]

## 2021-06-02 IMAGING — CT CT ANGIO CHEST
2 of 6 series · 19 of 46 positions shown · IV contrast (omnipaque)
Comparison: Chest x-ray 10/02/2019, CT 09/14/2019

CLINICAL DATA: Shortness of breath

EXAM:
CT ANGIOGRAPHY CHEST WITH CONTRAST
TECHNIQUE: Multidetector CT imaging of the chest was performed using the
standard protocol during bolus administration of intravenous
contrast. Multiplanar CT image reconstructions and MIPs were
obtained to evaluate the vascular anatomy.
CONTRAST:  100mL OMNIPAQUE IOHEXOL 300 MG/ML  SOLN

[Series 6: thins · axial · 0.75mm/px · z∈[+731,+993]mm · 16 of 288 slices shown]
[im 13/288  lung]
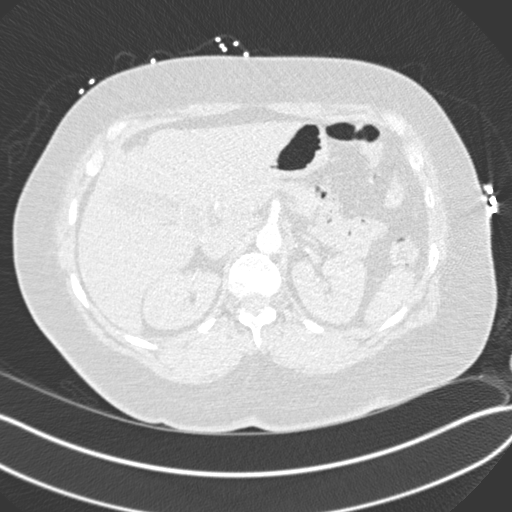
[im 38/288  soft-tissue]
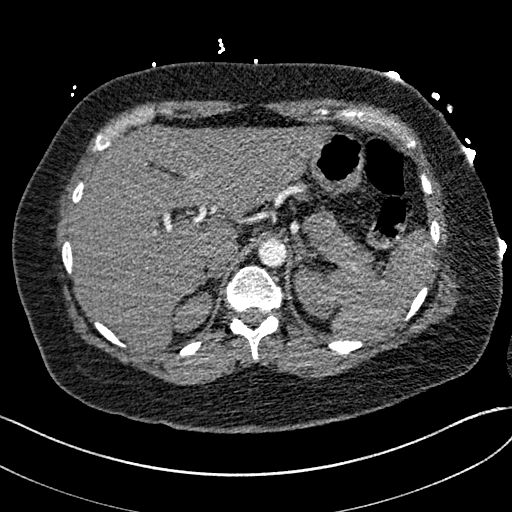
[im 50/288  lung]
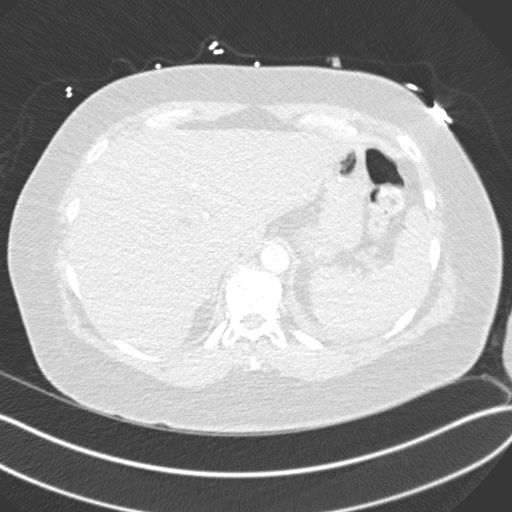
[im 63/288  soft-tissue]
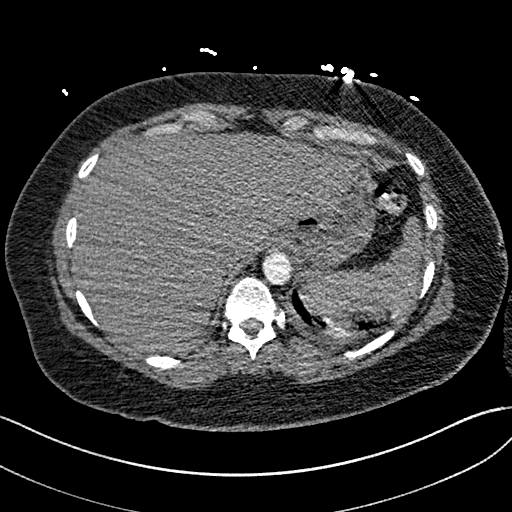
[im 88/288  lung]
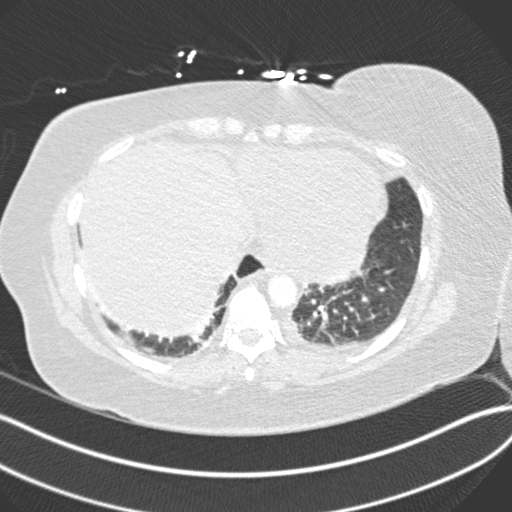
[im 100/288  soft-tissue]
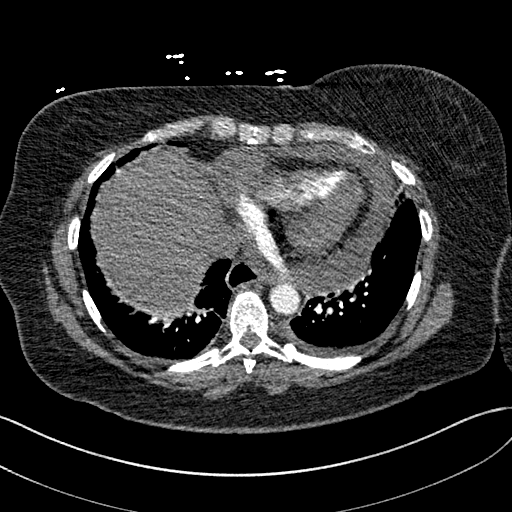
[im 113/288  lung]
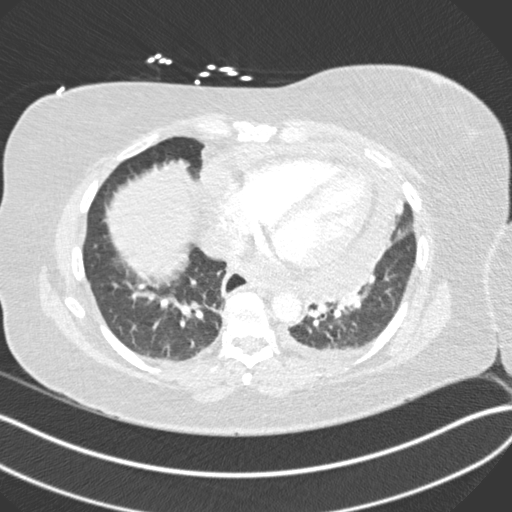
[im 138/288  soft-tissue]
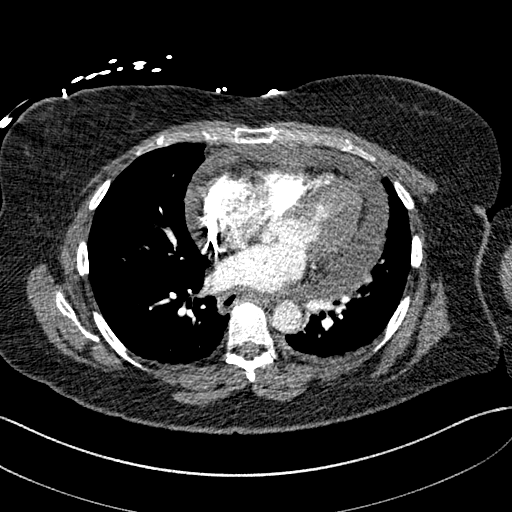
[im 150/288  lung]
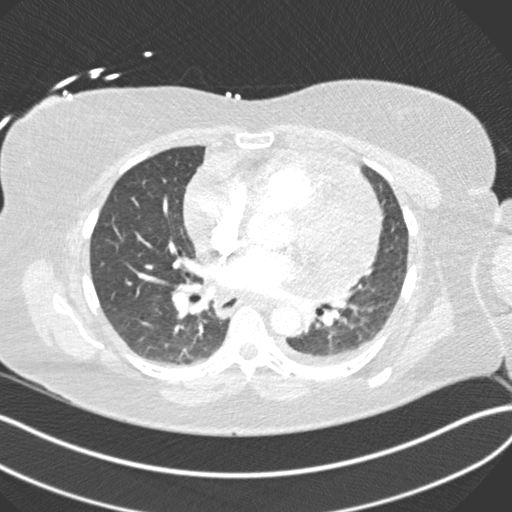
[im 175/288  soft-tissue]
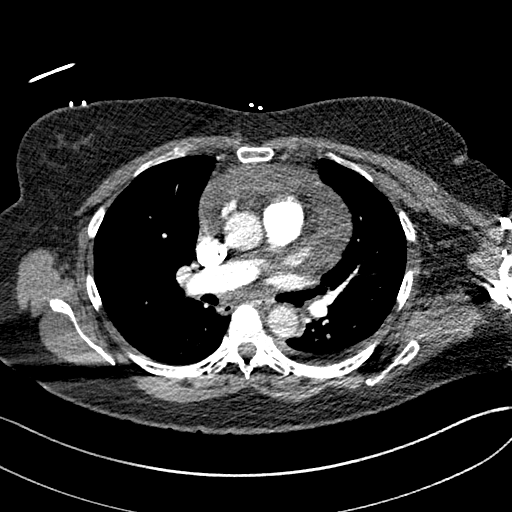
[im 188/288  lung]
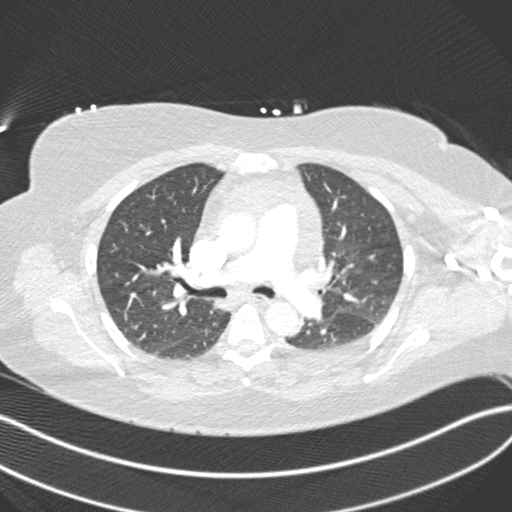
[im 200/288  soft-tissue]
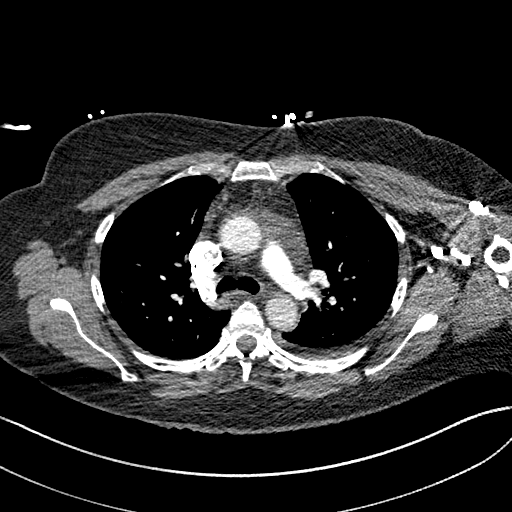
[im 225/288  lung]
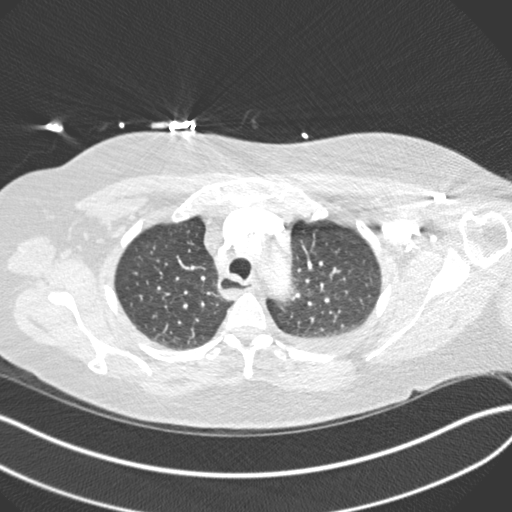
[im 238/288  soft-tissue]
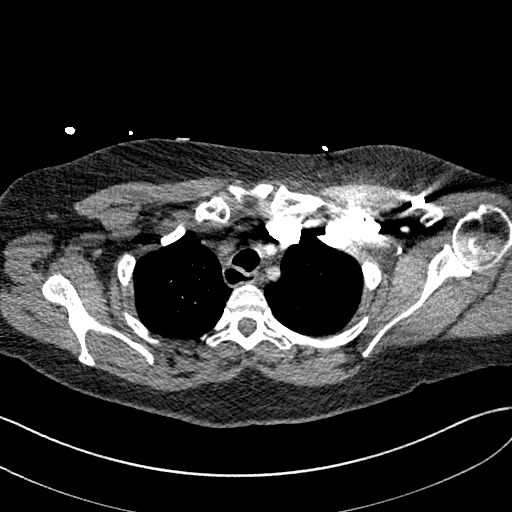
[im 250/288  lung]
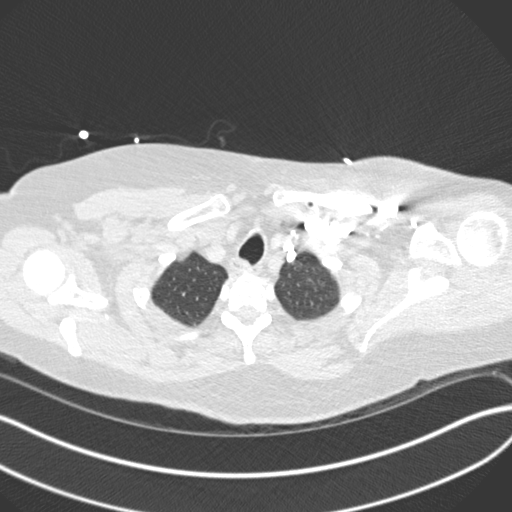
[im 275/288  soft-tissue]
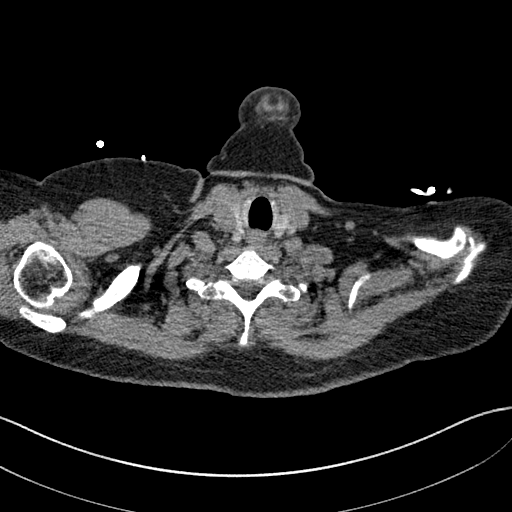

[Series 8: coronal mpr · coronal · 0.59mm/px · 3 of 151 slices shown]
[im 38/151  soft-tissue]
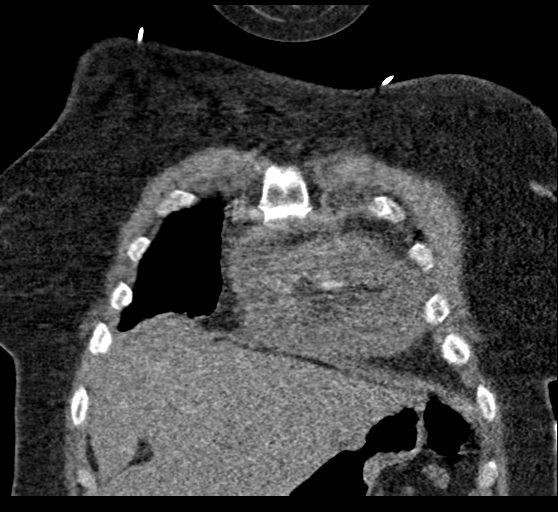
[im 76/151  soft-tissue]
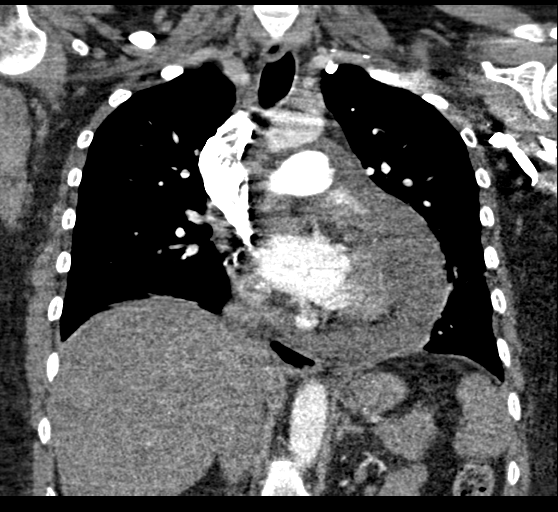
[im 113/151  soft-tissue]
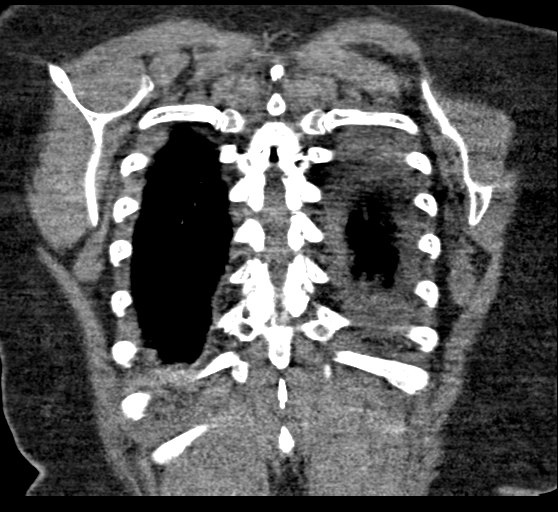

[19 of 46 positions shown; findings below may reference images not displayed]

FINDINGS: Cardiovascular: Satisfactory opacification of the pulmonary arteries
to the segmental level. No evidence of pulmonary embolism.
Nonaneurysmal aorta. No dissection is seen. Heart size within normal
limits. Moderate pericardial effusion measuring up to 2.8 cm in
thickness on the left side.

Mediastinum/Nodes: No enlarged mediastinal, hilar, or axillary lymph
nodes. Thyroid gland, trachea, and esophagus demonstrate no
significant findings.

Lungs/Pleura: Small left-sided pleural effusion. No consolidation or
pneumothorax

Upper Abdomen: No acute abnormality.

Musculoskeletal: No chest wall abnormality. No acute or significant
osseous findings.

Review of the MIP images confirms the above findings.
IMPRESSION: 1. Negative for acute pulmonary embolus.
2. New moderate pericardial effusion, may correlate to history of
pericarditis.
3. Small left-sided pleural effusion

## 2021-07-20 ENCOUNTER — Inpatient Hospital Stay (HOSPITAL_COMMUNITY)
Admission: EM | Admit: 2021-07-20 | Discharge: 2021-07-22 | DRG: 246 | Disposition: A | Discharge status: Home / Self Care | Payer: BC Managed Care – PPO | Attending: Cardiovascular Disease | Admitting: Cardiovascular Disease

## 2021-07-20 ENCOUNTER — Inpatient Hospital Stay (HOSPITAL_COMMUNITY): Payer: BC Managed Care – PPO

## 2021-07-20 ENCOUNTER — Emergency Department (HOSPITAL_COMMUNITY): Payer: BC Managed Care – PPO

## 2021-07-20 ENCOUNTER — Other Ambulatory Visit: Payer: Self-pay

## 2021-07-20 ENCOUNTER — Encounter (HOSPITAL_COMMUNITY)
Admission: EM | Disposition: A | Discharge status: Home / Self Care | Payer: Self-pay | Source: Home / Self Care | Attending: Cardiovascular Disease

## 2021-07-20 ENCOUNTER — Encounter (HOSPITAL_COMMUNITY): Payer: Self-pay

## 2021-07-20 ENCOUNTER — Other Ambulatory Visit (HOSPITAL_COMMUNITY): Payer: Self-pay

## 2021-07-20 DIAGNOSIS — I2510 Atherosclerotic heart disease of native coronary artery without angina pectoris: Secondary | ICD-10-CM | POA: Diagnosis present

## 2021-07-20 DIAGNOSIS — I25119 Atherosclerotic heart disease of native coronary artery with unspecified angina pectoris: Secondary | ICD-10-CM | POA: Diagnosis present

## 2021-07-20 DIAGNOSIS — Z6831 Body mass index (BMI) 31.0-31.9, adult: Secondary | ICD-10-CM

## 2021-07-20 DIAGNOSIS — F419 Anxiety disorder, unspecified: Secondary | ICD-10-CM | POA: Diagnosis present

## 2021-07-20 DIAGNOSIS — I5021 Acute systolic (congestive) heart failure: Secondary | ICD-10-CM | POA: Diagnosis present

## 2021-07-20 DIAGNOSIS — J45909 Unspecified asthma, uncomplicated: Secondary | ICD-10-CM | POA: Diagnosis present

## 2021-07-20 DIAGNOSIS — Z8701 Personal history of pneumonia (recurrent): Secondary | ICD-10-CM

## 2021-07-20 DIAGNOSIS — Z8719 Personal history of other diseases of the digestive system: Secondary | ICD-10-CM

## 2021-07-20 DIAGNOSIS — N182 Chronic kidney disease, stage 2 (mild): Secondary | ICD-10-CM | POA: Diagnosis present

## 2021-07-20 DIAGNOSIS — Z79899 Other long term (current) drug therapy: Secondary | ICD-10-CM | POA: Diagnosis not present

## 2021-07-20 DIAGNOSIS — I255 Ischemic cardiomyopathy: Secondary | ICD-10-CM

## 2021-07-20 DIAGNOSIS — E669 Obesity, unspecified: Secondary | ICD-10-CM | POA: Diagnosis present

## 2021-07-20 DIAGNOSIS — I214 Non-ST elevation (NSTEMI) myocardial infarction: Secondary | ICD-10-CM | POA: Diagnosis present

## 2021-07-20 DIAGNOSIS — M1990 Unspecified osteoarthritis, unspecified site: Secondary | ICD-10-CM | POA: Diagnosis present

## 2021-07-20 DIAGNOSIS — Z7951 Long term (current) use of inhaled steroids: Secondary | ICD-10-CM | POA: Diagnosis not present

## 2021-07-20 DIAGNOSIS — Z20822 Contact with and (suspected) exposure to covid-19: Secondary | ICD-10-CM | POA: Diagnosis present

## 2021-07-20 DIAGNOSIS — I2109 ST elevation (STEMI) myocardial infarction involving other coronary artery of anterior wall: Principal | ICD-10-CM | POA: Diagnosis present

## 2021-07-20 DIAGNOSIS — I130 Hypertensive heart and chronic kidney disease with heart failure and stage 1 through stage 4 chronic kidney disease, or unspecified chronic kidney disease: Secondary | ICD-10-CM | POA: Diagnosis present

## 2021-07-20 DIAGNOSIS — Z8616 Personal history of COVID-19: Secondary | ICD-10-CM

## 2021-07-20 DIAGNOSIS — Z955 Presence of coronary angioplasty implant and graft: Secondary | ICD-10-CM

## 2021-07-20 DIAGNOSIS — R739 Hyperglycemia, unspecified: Secondary | ICD-10-CM | POA: Diagnosis present

## 2021-07-20 DIAGNOSIS — R0789 Other chest pain: Secondary | ICD-10-CM | POA: Diagnosis present

## 2021-07-20 DIAGNOSIS — I213 ST elevation (STEMI) myocardial infarction of unspecified site: Secondary | ICD-10-CM

## 2021-07-20 HISTORY — PX: LEFT HEART CATH AND CORONARY ANGIOGRAPHY: CATH118249

## 2021-07-20 HISTORY — DX: Acute myocardial infarction, unspecified: I21.9

## 2021-07-20 LAB — ECHOCARDIOGRAM COMPLETE
AR max vel: 2.78 cm2
AV Area VTI: 2.89 cm2
AV Area mean vel: 2.54 cm2
AV Mean grad: 3 mmHg
AV Peak grad: 4.8 mmHg
Ao pk vel: 1.1 m/s
Area-P 1/2: 5.62 cm2
Height: 65.5 in
S' Lateral: 2.4 cm
Weight: 3192.26 oz

## 2021-07-20 LAB — PROTIME-INR
INR: 1.1 (ref 0.8–1.2)
Prothrombin Time: 14.3 seconds (ref 11.4–15.2)

## 2021-07-20 LAB — CBC WITH DIFFERENTIAL/PLATELET
Abs Immature Granulocytes: 0.09 10*3/uL — ABNORMAL HIGH (ref 0.00–0.07)
Basophils Absolute: 0.1 10*3/uL (ref 0.0–0.1)
Basophils Relative: 1 %
Eosinophils Absolute: 0.4 10*3/uL (ref 0.0–0.5)
Eosinophils Relative: 3 %
HCT: 42.5 % (ref 36.0–46.0)
Hemoglobin: 13.9 g/dL (ref 12.0–15.0)
Immature Granulocytes: 1 %
Lymphocytes Relative: 26 %
Lymphs Abs: 3.7 10*3/uL (ref 0.7–4.0)
MCH: 27.6 pg (ref 26.0–34.0)
MCHC: 32.7 g/dL (ref 30.0–36.0)
MCV: 84.5 fL (ref 80.0–100.0)
Monocytes Absolute: 1 10*3/uL (ref 0.1–1.0)
Monocytes Relative: 7 %
Neutro Abs: 9 10*3/uL — ABNORMAL HIGH (ref 1.7–7.7)
Neutrophils Relative %: 62 %
Platelets: 239 10*3/uL (ref 150–400)
RBC: 5.03 MIL/uL (ref 3.87–5.11)
RDW: 13.7 % (ref 11.5–15.5)
WBC: 14.3 10*3/uL — ABNORMAL HIGH (ref 4.0–10.5)
nRBC: 0 % (ref 0.0–0.2)

## 2021-07-20 LAB — BASIC METABOLIC PANEL
Anion gap: 8 (ref 5–15)
Anion gap: 7 (ref 5–15)
BUN: 19 mg/dL (ref 6–20)
BUN: 15 mg/dL (ref 6–20)
CO2: 24 mmol/L (ref 22–32)
CO2: 24 mmol/L (ref 22–32)
Calcium: 9.4 mg/dL (ref 8.9–10.3)
Calcium: 8.7 mg/dL — ABNORMAL LOW (ref 8.9–10.3)
Chloride: 103 mmol/L (ref 98–111)
Chloride: 105 mmol/L (ref 98–111)
Creatinine, Ser: 1.1 mg/dL — ABNORMAL HIGH (ref 0.44–1.00)
Creatinine, Ser: 0.98 mg/dL (ref 0.44–1.00)
GFR, Estimated: 59 mL/min — ABNORMAL LOW (ref >60)
GFR, Estimated: >60 mL/min (ref >60)
Glucose, Bld: 127 mg/dL — ABNORMAL HIGH (ref 70–99)
Glucose, Bld: 110 mg/dL — ABNORMAL HIGH (ref 70–99)
Potassium: 4.2 mmol/L (ref 3.5–5.1)
Potassium: 4 mmol/L (ref 3.5–5.1)
Sodium: 135 mmol/L (ref 135–145)
Sodium: 136 mmol/L (ref 135–145)

## 2021-07-20 LAB — CBC
HCT: 40.6 % (ref 36.0–46.0)
Hemoglobin: 13.7 g/dL (ref 12.0–15.0)
MCH: 27.8 pg (ref 26.0–34.0)
MCHC: 33.7 g/dL (ref 30.0–36.0)
MCV: 82.5 fL (ref 80.0–100.0)
Platelets: 217 10*3/uL (ref 150–400)
RBC: 4.92 MIL/uL (ref 3.87–5.11)
RDW: 13.7 % (ref 11.5–15.5)
WBC: 14.5 10*3/uL — ABNORMAL HIGH (ref 4.0–10.5)
nRBC: 0 % (ref 0.0–0.2)

## 2021-07-20 LAB — LIPID PANEL
Cholesterol: 165 mg/dL (ref 0–200)
Cholesterol: 148 mg/dL (ref 0–200)
HDL: 51 mg/dL (ref >40)
HDL: 45 mg/dL (ref >40)
LDL Cholesterol: 96 mg/dL (ref 0–99)
LDL Cholesterol: 97 mg/dL (ref 0–99)
Total CHOL/HDL Ratio: 3.2 RATIO
Total CHOL/HDL Ratio: 3.3 RATIO
Triglycerides: 89 mg/dL (ref <150)
Triglycerides: 31 mg/dL (ref <150)
VLDL: 18 mg/dL (ref 0–40)
VLDL: 6 mg/dL (ref 0–40)

## 2021-07-20 LAB — APTT: aPTT: 28 seconds (ref 24–36)

## 2021-07-20 LAB — RESP PANEL BY RT-PCR (FLU A&B, COVID) ARPGX2
Influenza A by PCR: NEGATIVE
Influenza B by PCR: NEGATIVE
SARS Coronavirus 2 by RT PCR: NEGATIVE

## 2021-07-20 LAB — TROPONIN I (HIGH SENSITIVITY)
Troponin I (High Sensitivity): 992 ng/L (ref <18)
Troponin I (High Sensitivity): 1104 ng/L (ref <18)
Troponin I (High Sensitivity): 2198 ng/L (ref <18)

## 2021-07-20 LAB — GLUCOSE, CAPILLARY: Glucose-Capillary: 117 mg/dL — ABNORMAL HIGH (ref 70–99)

## 2021-07-20 LAB — HEMOGLOBIN A1C
Hgb A1c MFr Bld: 5.6 % (ref 4.8–5.6)
Mean Plasma Glucose: 114.02 mg/dL

## 2021-07-20 LAB — TSH: TSH: 3.134 u[IU]/mL (ref 0.350–4.500)

## 2021-07-20 LAB — MRSA NEXT GEN BY PCR, NASAL: MRSA by PCR Next Gen: NOT DETECTED

## 2021-07-20 LAB — BRAIN NATRIURETIC PEPTIDE: B Natriuretic Peptide: 495.4 pg/mL — ABNORMAL HIGH (ref 0.0–100.0)

## 2021-07-20 LAB — POCT ACTIVATED CLOTTING TIME: Activated Clotting Time: 299 seconds

## 2021-07-20 SURGERY — LEFT HEART CATH AND CORONARY ANGIOGRAPHY
Anesthesia: LOCAL

## 2021-07-20 MED ORDER — METOPROLOL SUCCINATE ER 25 MG PO TB24
25.0000 mg | ORAL_TABLET | Freq: Every day | ORAL | Status: DC
  Administered 2021-07-20 – 2021-07-21 (×2): 25 mg via ORAL
  Filled 2021-07-20 (×2): qty 1

## 2021-07-20 MED ORDER — ASPIRIN 81 MG PO CHEW
324.0000 mg | CHEWABLE_TABLET | Freq: Once | ORAL | Status: AC
  Administered 2021-07-20: 03:00:00 324 mg via ORAL
  Filled 2021-07-20: qty 4

## 2021-07-20 MED ORDER — FENTANYL CITRATE (PF) 100 MCG/2ML IJ SOLN
INTRAMUSCULAR | Status: AC
  Filled 2021-07-20: qty 2

## 2021-07-20 MED ORDER — SERTRALINE HCL 50 MG PO TABS
150.0000 mg | ORAL_TABLET | Freq: Every day | ORAL | Status: DC
  Administered 2021-07-20 – 2021-07-22 (×3): 150 mg via ORAL
  Filled 2021-07-20 (×3): qty 1

## 2021-07-20 MED ORDER — LIDOCAINE HCL (PF) 1 % IJ SOLN
INTRAMUSCULAR | Status: AC
  Filled 2021-07-20: qty 30

## 2021-07-20 MED ORDER — HEPARIN SODIUM (PORCINE) 1000 UNIT/ML IJ SOLN
INTRAMUSCULAR | Status: AC
  Filled 2021-07-20: qty 10

## 2021-07-20 MED ORDER — HEPARIN SODIUM (PORCINE) 5000 UNIT/ML IJ SOLN
4000.0000 [IU] | Freq: Once | INTRAMUSCULAR | Status: AC
  Administered 2021-07-20: 03:00:00 4000 [IU] via INTRAVENOUS
  Filled 2021-07-20: qty 1

## 2021-07-20 MED ORDER — HEPARIN SODIUM (PORCINE) 1000 UNIT/ML IJ SOLN
INTRAMUSCULAR | Status: DC | PRN
  Administered 2021-07-20: 4000 [IU] via INTRAVENOUS
  Administered 2021-07-20: 3000 [IU] via INTRAVENOUS

## 2021-07-20 MED ORDER — MONTELUKAST SODIUM 10 MG PO TABS
10.0000 mg | ORAL_TABLET | Freq: Every day | ORAL | Status: DC
  Administered 2021-07-20 – 2021-07-21 (×2): 10 mg via ORAL
  Filled 2021-07-20 (×2): qty 1

## 2021-07-20 MED ORDER — VERAPAMIL HCL 2.5 MG/ML IV SOLN
INTRAVENOUS | Status: DC | PRN
  Administered 2021-07-20: 03:00:00 10 mL via INTRA_ARTERIAL

## 2021-07-20 MED ORDER — ONDANSETRON HCL 4 MG/2ML IJ SOLN: 4.0000 mg | Freq: Four times a day (QID) | INTRAMUSCULAR | Status: DC | PRN

## 2021-07-20 MED ORDER — FLUTICASONE PROPIONATE 50 MCG/ACT NA SUSP
1.0000 | Freq: Two times a day (BID) | NASAL | Status: DC | PRN
  Filled 2021-07-20: qty 16

## 2021-07-20 MED ORDER — AZELASTINE-FLUTICASONE 137-50 MCG/ACT NA SUSP: 1.0000 | Freq: Two times a day (BID) | NASAL | Status: DC | PRN

## 2021-07-20 MED ORDER — TICAGRELOR 90 MG PO TABS
ORAL_TABLET | ORAL | Status: DC | PRN
  Administered 2021-07-20: 180 mg via ORAL

## 2021-07-20 MED ORDER — HEPARIN (PORCINE) IN NACL 2000-0.9 UNIT/L-% IV SOLN
INTRAVENOUS | Status: DC | PRN
  Administered 2021-07-20: 1000 mL

## 2021-07-20 MED ORDER — VERAPAMIL HCL 2.5 MG/ML IV SOLN
INTRAVENOUS | Status: AC
  Filled 2021-07-20: qty 2

## 2021-07-20 MED ORDER — NITROGLYCERIN 1 MG/10 ML FOR IR/CATH LAB
INTRA_ARTERIAL | Status: AC
  Filled 2021-07-20: qty 10

## 2021-07-20 MED ORDER — ACETAMINOPHEN 325 MG PO TABS: 650.0000 mg | ORAL_TABLET | ORAL | Status: DC | PRN

## 2021-07-20 MED ORDER — NITROGLYCERIN 0.4 MG SL SUBL: 0.4000 mg | SUBLINGUAL_TABLET | SUBLINGUAL | Status: DC | PRN

## 2021-07-20 MED ORDER — MIDAZOLAM HCL 2 MG/2ML IJ SOLN
INTRAMUSCULAR | Status: AC
  Filled 2021-07-20: qty 2

## 2021-07-20 MED ORDER — MIDAZOLAM HCL 2 MG/2ML IJ SOLN
INTRAMUSCULAR | Status: DC | PRN
  Administered 2021-07-20: 2 mg via INTRAVENOUS

## 2021-07-20 MED ORDER — ALBUTEROL SULFATE (2.5 MG/3ML) 0.083% IN NEBU: 2.5000 mg | INHALATION_SOLUTION | Freq: Four times a day (QID) | RESPIRATORY_TRACT | Status: DC | PRN

## 2021-07-20 MED ORDER — NITROGLYCERIN 2 % TD OINT
1.0000 [in_us] | TOPICAL_OINTMENT | Freq: Once | TRANSDERMAL | Status: AC
  Administered 2021-07-20: 03:00:00 1 [in_us] via TOPICAL
  Filled 2021-07-20: qty 1

## 2021-07-20 MED ORDER — ALPRAZOLAM 0.25 MG PO TABS: 0.2500 mg | ORAL_TABLET | Freq: Three times a day (TID) | ORAL | Status: DC | PRN

## 2021-07-20 MED ORDER — ATORVASTATIN CALCIUM 80 MG PO TABS
80.0000 mg | ORAL_TABLET | Freq: Every day | ORAL | Status: DC
  Administered 2021-07-20 – 2021-07-22 (×3): 80 mg via ORAL
  Filled 2021-07-20 (×3): qty 1

## 2021-07-20 MED ORDER — SODIUM CHLORIDE 0.9% FLUSH: 3.0000 mL | INTRAVENOUS | Status: DC | PRN

## 2021-07-20 MED ORDER — AZELASTINE HCL 0.1 % NA SOLN
1.0000 | Freq: Two times a day (BID) | NASAL | Status: DC | PRN
  Filled 2021-07-20: qty 30

## 2021-07-20 MED ORDER — EMPAGLIFLOZIN 10 MG PO TABS
10.0000 mg | ORAL_TABLET | Freq: Every day | ORAL | Status: DC
  Administered 2021-07-20 – 2021-07-22 (×3): 10 mg via ORAL
  Filled 2021-07-20 (×3): qty 1

## 2021-07-20 MED ORDER — LIDOCAINE HCL (PF) 1 % IJ SOLN
INTRAMUSCULAR | Status: DC | PRN
  Administered 2021-07-20: 2 mL

## 2021-07-20 MED ORDER — AMOXICILLIN-POT CLAVULANATE 875-125 MG PO TABS: 1.0000 | ORAL_TABLET | Freq: Two times a day (BID) | ORAL | Status: DC

## 2021-07-20 MED ORDER — SODIUM CHLORIDE 0.9% FLUSH
3.0000 mL | Freq: Two times a day (BID) | INTRAVENOUS | Status: DC
  Administered 2021-07-20 – 2021-07-22 (×4): 3 mL via INTRAVENOUS

## 2021-07-20 MED ORDER — SODIUM CHLORIDE 0.9 % IV SOLN
INTRAVENOUS | Status: DC
  Administered 2021-07-20: 03:00:00 10 mL/h via INTRAVENOUS

## 2021-07-20 MED ORDER — TICAGRELOR 90 MG PO TABS
90.0000 mg | ORAL_TABLET | Freq: Two times a day (BID) | ORAL | Status: DC
  Administered 2021-07-20 – 2021-07-22 (×5): 90 mg via ORAL
  Filled 2021-07-20 (×5): qty 1

## 2021-07-20 MED ORDER — LORATADINE 10 MG PO TABS
10.0000 mg | ORAL_TABLET | Freq: Every day | ORAL | Status: DC
  Administered 2021-07-20 – 2021-07-22 (×3): 10 mg via ORAL
  Filled 2021-07-20 (×3): qty 1

## 2021-07-20 MED ORDER — FENTANYL CITRATE (PF) 100 MCG/2ML IJ SOLN
INTRAMUSCULAR | Status: DC | PRN
  Administered 2021-07-20: 25 ug via INTRAVENOUS

## 2021-07-20 MED ORDER — SACUBITRIL-VALSARTAN 24-26 MG PO TABS
1.0000 | ORAL_TABLET | Freq: Two times a day (BID) | ORAL | Status: DC
  Administered 2021-07-20 – 2021-07-22 (×5): 1 via ORAL
  Filled 2021-07-20 (×6): qty 1

## 2021-07-20 MED ORDER — ASPIRIN 81 MG PO CHEW
81.0000 mg | CHEWABLE_TABLET | Freq: Every day | ORAL | Status: DC
  Administered 2021-07-20 – 2021-07-22 (×3): 81 mg via ORAL
  Filled 2021-07-20 (×3): qty 1

## 2021-07-20 MED ORDER — TICAGRELOR 90 MG PO TABS
ORAL_TABLET | ORAL | Status: AC
  Filled 2021-07-20: qty 2

## 2021-07-20 MED ORDER — SODIUM CHLORIDE 0.9 % IV SOLN: INTRAVENOUS | Status: AC

## 2021-07-20 MED ORDER — NITROGLYCERIN 0.4 MG SL SUBL
0.4000 mg | SUBLINGUAL_TABLET | Freq: Once | SUBLINGUAL | Status: DC
  Filled 2021-07-20: qty 1

## 2021-07-20 MED ORDER — SODIUM CHLORIDE 0.9 % IV SOLN: 250.0000 mL | INTRAVENOUS | Status: DC | PRN

## 2021-07-20 MED ORDER — IOHEXOL 350 MG/ML SOLN
INTRAVENOUS | Status: DC | PRN
  Administered 2021-07-20: 04:00:00 120 mL via INTRA_ARTERIAL

## 2021-07-20 MED ORDER — PANTOPRAZOLE SODIUM 40 MG PO TBEC
40.0000 mg | DELAYED_RELEASE_TABLET | Freq: Every day | ORAL | Status: DC
  Administered 2021-07-20 – 2021-07-21 (×2): 40 mg via ORAL
  Filled 2021-07-20 (×2): qty 1

## 2021-07-20 MED ORDER — HEPARIN (PORCINE) IN NACL 2000-0.9 UNIT/L-% IV SOLN
INTRAVENOUS | Status: AC
  Filled 2021-07-20: qty 1000

## 2021-07-20 MED ORDER — NITROGLYCERIN 1 MG/10 ML FOR IR/CATH LAB
INTRA_ARTERIAL | Status: DC | PRN
  Administered 2021-07-20: 200 ug via INTRACORONARY

## 2021-07-20 MED ORDER — IPRATROPIUM BROMIDE 0.06 % NA SOLN
1.0000 | Freq: Every day | NASAL | Status: DC | PRN
  Filled 2021-07-20: qty 15

## 2021-07-20 SURGICAL SUPPLY — 15 items
CATH OPTITORQUE TIG 4.0 5F (CATHETERS) ×1 IMPLANT
CATH VISTA GUIDE 6FR XB3.5 (CATHETERS) ×1 IMPLANT
DEVICE RAD COMP TR BAND LRG (VASCULAR PRODUCTS) ×1 IMPLANT
GLIDESHEATH SLEND A-KIT 6F 22G (SHEATH) ×1 IMPLANT
GUIDEWIRE INQWIRE 1.5J.035X260 (WIRE) IMPLANT
INQWIRE 1.5J .035X260CM (WIRE) ×2
KIT ENCORE 26 ADVANTAGE (KITS) ×1 IMPLANT
KIT HEART LEFT (KITS) ×2 IMPLANT
PACK CARDIAC CATHETERIZATION (CUSTOM PROCEDURE TRAY) ×2 IMPLANT
STENT ONYX FRONTIER 3.5X18 (Permanent Stent) ×1 IMPLANT
STENT SYNERGY XD 3.50X16 (Permanent Stent) IMPLANT
SYNERGY XD 3.50X16 (Permanent Stent) ×2 IMPLANT
TRANSDUCER W/STOPCOCK (MISCELLANEOUS) ×2 IMPLANT
TUBING CIL FLEX 10 FLL-RA (TUBING) ×2 IMPLANT
WIRE COUGAR XT STRL 190CM (WIRE) ×1 IMPLANT

## 2021-07-20 NOTE — H&P (Signed)
CARDIOLOGY ADMIT NOTE   Patient ID: Bonnie Nelson MRN: [id number] DOB/AGE: 57-Nov-1967 57 y.o.  Admit date: 07/20/2021 Primary Physician:  Deland Pretty, MD  Patient ID: Bonnie Nelson, female    DOB: Sep 04, 1965, 55 y.o.   MRN: [id number]  Chief Complaint  Patient presents with   Chest Pain   HPI:    Bonnie Nelson  is a 55 y.o. Caucasian female patient with history of pericarditis and had undergone pericardiocentesis in February 2021, normal nuclear stress test on 09/15/2019, presenting with chest discomfort in the form of pressure-like sensation in the middle of the chest with radiation to her arms and neck with abnormal EKG and abnormal serum troponin.  Similar to last presentation in February 2021, she has been having upper respiratory symptoms for the past 1 week.  She has been having some occasional palpitations.  She started having chest discomfort around 6 PM last evening, took Xanax with no relief, chest discomfort On and off throughout the evening and finally with chest pain getting worse and radiating to her neck and arms and felt differently than pericarditis as it was not sharp, she presented to the emergency room.  I was called to see the patient.  Patient upon my presentation was chest pain-free.  Past Medical History:  Diagnosis Date   Anxiety    Arthritis    Asthma    Pericarditis    Pneumonia    Past Surgical History:  Procedure Laterality Date   achalasia     COLONOSCOPY     DILATION AND CURETTAGE OF UTERUS     KNEE ARTHROSCOPY WITH MEDIAL MENISECTOMY Right 03/22/2021   Procedure: KNEE ARTHROSCOPY WITH MEDIAL AND LATERAL  MENISECTOMY;  Surgeon: Nicholes Stairs, MD;  Location: [facility];  Service: Orthopedics;  Laterality: Right;   NASAL SEPTUM SURGERY     PERICARDIAL FLUID DRAINAGE  10/03/2019   PERICARDIOCENTESIS N/A 10/03/2019   Procedure: PERICARDIOCENTESIS;  Surgeon: Dixie Dials, MD;  Location: [facility];  Service: Cardiovascular;  Laterality: N/A;    Social History   Socioeconomic History   Marital status: Married    Spouse name: Not on file   Number of children: Not on file   Years of education: Not on file   Highest education level: Not on file  Occupational History   Occupation: teacher  Tobacco Use   Smoking status: Never   Smokeless tobacco: Never  Vaping Use   Vaping Use: Never used  Substance and Sexual Activity   Alcohol use: Yes    Alcohol/week: 3.0 standard drinks    Types: 3 Glasses of wine per week    Comment: OCCASIONAL   Drug use: Never   Sexual activity: Not on file  Other Topics Concern   Not on file  Social History Narrative   Not on file   Social Determinants of Health   Financial Resource Strain: Not on file  Food Insecurity: Not on file  Transportation Needs: Not on file  Physical Activity: Not on file  Stress: Not on file  Social Connections: Not on file  Intimate Partner Violence: Not on file   No family history on file.  ROS  Review of Systems  Constitutional: Negative.  Cardiovascular:  Positive for chest pain and dyspnea on exertion. Negative for leg swelling.  Respiratory:  Positive for cough and wheezing.   Gastrointestinal:  Negative for melena.  All other systems reviewed and are negative. Objective   Vitals with BMI 07/20/2021 07/20/2021 07/20/2021  Height -  5' 5.5" -  Weight - 194 lbs -  BMI - 31.78 -  Systolic 128 - 151  Diastolic 84 - 98  Pulse 80 - 85     Physical Exam Constitutional:      Appearance: Normal appearance.  HENT:     Mouth/Throat:     Pharynx: Posterior oropharyngeal erythema present.  Eyes:     Extraocular Movements: Extraocular movements intact.  Neck:     Vascular: No carotid bruit or JVD.  Cardiovascular:     Rate and Rhythm: Normal rate and regular rhythm.     Pulses: Intact distal pulses.     Heart sounds: Normal heart sounds. No murmur heard.   No gallop.  Pulmonary:     Effort: Pulmonary effort is normal.     Breath sounds: Normal  breath sounds.  Abdominal:     General: Bowel sounds are normal.     Palpations: Abdomen is soft.  Musculoskeletal:        General: No swelling. Normal range of motion.     Cervical back: Neck supple.  Skin:    General: Skin is warm.     Capillary Refill: Capillary refill takes less than 2 seconds.  Neurological:     General: No focal deficit present.     Mental Status: She is oriented to person, place, and time.  Psychiatric:        Mood and Affect: Mood normal.        Behavior: Behavior normal.   Laboratory examination:   Recent Labs    07/20/21 0109  NA 135  K 4.2  CL 103  CO2 24  GLUCOSE 127*  BUN 19  CREATININE 1.10*  CALCIUM 9.4  GFRNONAA 59*   estimated creatinine clearance is 62.4 mL/min (A) (by C-G formula based on SCr of 1.1 mg/dL (H)).  CMP Latest Ref Rng & Units 07/20/2021 10/05/2019 10/03/2019  Glucose 70 - 99 mg/dL 976(B) 341(P) 379(K)  BUN 6 - 20 mg/dL 19 13 9   Creatinine 0.44 - 1.00 mg/dL ) 2.40(X 7.35)  Sodium 135 - 145 mmol/L 135 139 135  Potassium 3.5 - 5.1 mmol/L 4.2 4.1 4.6  Chloride 98 - 111 mmol/L 103 105 103  CO2 22 - 32 mmol/L 24 24 20(L)  Calcium 8.9 - 10.3 mg/dL 9.4 3.29(J) 2.4(Q)  Total Protein 6.5 - 8.1 g/dL - - -  Total Bilirubin 0.3 - 1.2 mg/dL - - -  Alkaline Phos 38 - 126 U/L - - -  AST 15 - 41 U/L - - -  ALT 0 - 44 U/L - - -   CBC Latest Ref Rng & Units 07/20/2021 10/03/2019 10/02/2019  WBC 4.0 - 10.5 K/uL 14.3(H) 14.4(H) 21.4(H)  Hemoglobin 12.0 - 15.0 g/dL 10/04/2019 41.9) 11.9(L)  Hematocrit 36.0 - 46.0 % 42.5 30.8(L) 38.6  Platelets 150 - 400 K/uL 239 260 405(H)   Lipid Panel     Component Value Date/Time   CHOL 125 09/15/2019 0241   TRIG 74 09/15/2019 0241   HDL 48 09/15/2019 0241   CHOLHDL 2.6 09/15/2019 0241   VLDL 15 09/15/2019 0241   LDLCALC 62 09/15/2019 0241   HEMOGLOBIN A1C Lab Results  Component Value Date   HGBA1C 5.6 09/14/2019   MPG 114.02 09/14/2019   TSH No results for input(s): TSH in the last 8760  hours. BNP (last 3 results) No results for input(s): BNP in the last 8760 hours. Cardiac Panel (last 3 results) Recent Labs    07/20/21 0109  TROPONINIHS 992*    Current Outpatient Medications  Medication Instructions   acetaminophen (TYLENOL) 1,000 mg, Oral, Every 6 hours PRN   albuterol (VENTOLIN HFA) 108 (90 Base) MCG/ACT inhaler 2 puffs, Inhalation, Every 6 hours PRN   ALPRAZolam (XANAX) 0.25 mg, Oral, 3 times daily PRN   amoxicillin-clavulanate (AUGMENTIN) 875-125 MG tablet 1 tablet, Oral, 2 times daily   aspirin EC 81 mg, Oral, Daily, Swallow whole.   Azelastine-Fluticasone 137-50 MCG/ACT SUSP 1 spray, Each Nare, 2 times daily, Dymista   desloratadine (CLARINEX) 5 mg, Oral, Daily   diclofenac Sodium (VOLTAREN) 2 g, Topical, Daily   diltiazem (CARDIZEM) 120 mg, Oral, Every 12 hours   fexofenadine (ALLEGRA) 180 mg, Oral, Daily   ipratropium (ATROVENT) 0.06 % nasal spray 2 sprays, Each Nare, Daily PRN   metoprolol tartrate (LOPRESSOR) 12.5 mg, Oral, 2 times daily   montelukast (SINGULAIR) 10 mg, Oral, Daily at bedtime   nitroGLYCERIN (NITROSTAT) 0.4 mg, Sublingual, Every 5 min x3 PRN   pantoprazole (PROTONIX) 40 mg, Oral, Daily   sertraline (ZOLOFT) 150 mg, Oral, Daily   SYMBICORT 160-4.5 MCG/ACT inhaler 2 puffs, Inhalation, 2 times daily    Radiology:  DG Chest 2 View  Result Date: 07/20/2021 CLINICAL DATA:  Chest pain, congestion, wheezing EXAM: CHEST - 2 VIEW COMPARISON:  10/03/2019 FINDINGS: Lungs are clear.  No pleural effusion or pneumothorax. The heart is normal in size. Visualized osseous structures are within normal limits. IMPRESSION: Normal chest radiographs. Electronically Signed   By: Charline Bills M.D.   On: 07/20/2021 02:11     Cardiac Studies:   Echocardiogram 10/07/2019:     1. Left ventricular ejection fraction, by estimation, is 55 to 60%. The  left ventricle has normal function. The left ventricle has no regional  wall motion abnormalities. Left  ventricular diastolic parameters were  normal.   2. Right ventricular systolic function is low normal. The right  ventricular size is mildly enlarged. There is normal pulmonary artery  systolic pressure.   3. Left atrial size was mildly dilated.   4. Right atrial size was mildly dilated.   5. The mitral valve is normal in structure and function. Mild mitral  valve regurgitation.   6. The aortic valve is normal in structure and function.   7. The inferior vena cava is normal in size with <50% respiratory  variability, suggesting right atrial pressure of 8 mmHg. EKG 07/20/2021: Normal sinus rhythm at a rate of 88 bpm, normal axis, there is diffuse ST segment depression in the inferior and lateral leads, ST elevation in aVR, aVL and V1 and V2.  Cannot exclude subendocardial ischemia versus infarct.  Assessment   1.  Abnormal EKG 2.  Abnormal cardiac markers with elevated serum troponin 3.  History of peritonitis 4.  Reactive airway disease  Recommendations:   Bonnie Nelson is a 55 y.o. Caucasian female patient with history of pericarditis and had undergone pericardiocentesis in February 2021, normal nuclear stress test on 09/15/2019, presenting with chest discomfort in the form of pressure-like sensation in the middle of the chest with radiation to her arms and neck with abnormal EKG and abnormal serum troponin.  Chest pain symptoms are worrisome for unstable angina pectoris and EKG is abnormal.  Although she has no significant cardiovascular risk factors, will proceed with emergent cardiac catheterization to eval coronary anatomy.  Patient was having active chest pain when she presented to the emergency room but has been chest pain-free since being here.  Patient's husband is  present at the bedside, Schedule for cardiac catheterization, and possible angioplasty. We discussed regarding risks, benefits, alternatives to this including stress testing, CTA and continued medical therapy. Patient  wants to proceed. Understands <1-2% risk of death, stroke, MI, urgent CABG, bleeding, infection, renal failure but not limited to these.    Adrian Prows, MD, [facility] 07/20/2021, 3:10 AM Office: [phone] Fax: [phone] Pager: [phone]  Adrian Prows, MD, [facility] 07/20/2021, 3:05 AM [facility] [place] Pager: [phone] Office: [phone]

## 2021-07-20 NOTE — ED Notes (Signed)
To the cath lab with RN and NT and DR Jacinto Halim.

## 2021-07-20 NOTE — TOC Benefit Eligibility Note (Addendum)
Patient Product/process development scientist completed.    The patient is currently admitted and upon discharge could be taking Brilinta 90 mg.  The current 30 day co-pay is, $30.00.   The patient is currently admitted and upon discharge could be taking Entresto 24-26 mg.  Prior Authorization Required  The patient is currently admitted and upon discharge could be taking Farxiga 10 mg.  The current 30 day co-pay is, $30.00.   The patient is currently admitted and upon discharge could be taking Jardiance 10 mg.  The current 30 day co-pay is, $30.00.   The patient is insured through [employer]    Roland Earl, CPhT Pharmacy Patient Advocate Specialist [facility] Patient Advocate Team Direct Number: [phone]  Fax: [phone]

## 2021-07-20 NOTE — ED Provider Notes (Addendum)
Emergency Medicine Provider Triage Evaluation Note  Bonnie Nelson , a 55 y.o. female  was evaluated in triage.  Pt complains of chest pain.  History of pericarditis.  States she thought she had upper respiratory infection last week.  Using her albuterol inhaler.  She is 8 weeks post COVID.  Was having some palpitations.  Was seen by Cards who thought she likely had a viral illness. Today though she was having a panic attack took Xanax x 3 without relief. Then developed "odd" sensation to her neck as well as her left arm.  She denies any paresthesias, weakness.  No back pain, lower extremity swelling.  Review of Systems  Positive: Chest pain, shortness of breath Negative: Fever, emesis  Physical Exam  BP (!) 151/98    Pulse 85    Temp 99 F (37.2 C)    Resp 17    SpO2 98%  Gen:   Awake, no distress   Resp:  Normal effort  MSK:   Moves extremities without difficulty  Other:    Medical Decision Making  Medically screening exam initiated at 1:38 AM.  Appropriate orders placed.  Bonnie Nelson was informed that the remainder of the evaluation will be completed by another provider, this initial triage assessment does not replace that evaluation, and the importance of remaining in the ED until their evaluation is complete.  CP, SOB   Tammy Ericsson A, PA-C 07/20/21 0138  ADDEND: Troponin elevated. Discussed with attending Dr. Judd Lien, did have some EKG on initial EKG. Given active CP with Ekg changes, elevated trop decision made to call code STEMI. Nursing aware patient needs room in back    Jakai Onofre A, PA-C 07/20/21 0231    Geoffery Lyons, MD 07/20/21 [phone]

## 2021-07-20 NOTE — TOC Benefit Eligibility Note (Signed)
Patient Advocate Encounter   Received notification that prior authorization for Entresto 24-26 is required.   PA submitted on 07/20/2021 Key [id number] Status is pending       Roland Earl, CPhT Pharmacy Patient Advocate Specialist [facility] Patient Advocate Team Direct Number: [phone]  Fax: [phone]

## 2021-07-20 NOTE — Progress Notes (Signed)
This chaplain responded to page for Code Stemi.  The Pt. is accompanied by her husband. The Pt. is awake and alert, updating the medical team. The chaplain introduces herself and the Pt. asks for prayers for the medical team.    This chaplain is available for F/U spiritual care as needed.  Chaplain Stephanie Acre [phone]

## 2021-07-20 NOTE — ED Provider Notes (Signed)
[facility] EMERGENCY DEPARTMENT Provider Note   CSN: [id number] Arrival date & time: 07/20/21  0048     History Chief Complaint  Patient presents with   Chest Pain    Bonnie Nelson is a 55 y.o. female with a hx of pericarditis, asthma, and anxiety who presents to the ED with complaints of chest pain that began around 18:30 this evening. Patient reports discomfort/ abnormal sensation to the left chest, radiates to the jaw and the LUE, feels like an aching discomfort with some mild associated nausea. She felt like she may have been having a panic attack therefore she took her xanax with some mild improvement, re-dosed this, but ultimately pain got worse and more persistent, has been constant since around 20:30. She called her cardiologist Dr. Roseanne Kaufman office and was told to come to the ED. Current discomfort is 2/10 in severity. She has had respiratory sxs x 1 week with cough, wheezing, and palpitations. She also mentions she had covid about 8 weeks ago. Denies vomiting, diaphoresis, syncope, dizziness, hemoptysis, or leg pain/swelling. Hx of pericarditis, but this feels very different.   HPI     Past Medical History:  Diagnosis Date   Anxiety    Arthritis    Asthma    Pericarditis    Pneumonia     Patient Active Problem List   Diagnosis Date Noted   Pericarditis, acute, idiopathic 10/03/2019   Acute pericarditis 09/15/2019   Acute coronary syndrome (HCC) 09/14/2019    Past Surgical History:  Procedure Laterality Date   achalasia     COLONOSCOPY     DILATION AND CURETTAGE OF UTERUS     KNEE ARTHROSCOPY WITH MEDIAL MENISECTOMY Right 03/22/2021   Procedure: KNEE ARTHROSCOPY WITH MEDIAL AND LATERAL  MENISECTOMY;  Surgeon: Yolonda Kida, MD;  Location: [facility];  Service: Orthopedics;  Laterality: Right;   NASAL SEPTUM SURGERY     PERICARDIAL FLUID DRAINAGE  10/03/2019   PERICARDIOCENTESIS N/A 10/03/2019   Procedure: PERICARDIOCENTESIS;  Surgeon:  Orpah Cobb, MD;  Location: [facility] INVASIVE CV LAB;  Service: Cardiovascular;  Laterality: N/A;     OB History   No obstetric history on file.     No family history on file.  Social History   Tobacco Use   Smoking status: Never   Smokeless tobacco: Never  Vaping Use   Vaping Use: Never used  Substance Use Topics   Alcohol use: Yes    Alcohol/week: 3.0 standard drinks    Types: 3 Glasses of wine per week    Comment: OCCASIONAL   Drug use: Never    Home Medications Prior to Admission medications   Medication Sig Start Date End Date Taking? Authorizing Provider  acetaminophen (TYLENOL) 500 MG tablet Take 1,000 mg by mouth every 6 (six) hours as needed for mild pain or moderate pain.    [provider]  albuterol (VENTOLIN HFA) 108 (90 Base) MCG/ACT inhaler Inhale 2 puffs into the lungs every 6 (six) hours as needed (Asthma).    [provider]  ALPRAZolam Prudy Feeler) 0.25 MG tablet Take 0.25 mg by mouth 3 (three) times daily as needed for anxiety. 04/08/19   [provider]  amoxicillin-clavulanate (AUGMENTIN) 875-125 MG tablet Take 1 tablet by mouth 2 (two) times daily. 05/29/21   Jannifer Rodney A, FNP  aspirin EC 81 MG tablet Take 81 mg by mouth daily. Swallow whole.    [provider]  Azelastine-Fluticasone 137-50 MCG/ACT SUSP Place 1 spray into both nostrils  2 (two) times daily. Dymista 09/05/19   [provider]  desloratadine (CLARINEX) 5 MG tablet Take 5 mg by mouth daily.    [provider]  diclofenac Sodium (VOLTAREN) 1 % GEL Apply 2 g topically daily.    [provider]  diltiazem (CARDIZEM) 120 MG tablet Take 1 tablet (120 mg total) by mouth every 12 (twelve) hours. 10/07/19   Dixie Dials, MD  fexofenadine (ALLEGRA) 180 MG tablet Take 180 mg by mouth daily.    [provider]  ipratropium (ATROVENT) 0.06 % nasal spray Place 2 sprays into both nostrils daily as needed for allergies. 10/26/20   [provider]  metoprolol tartrate (LOPRESSOR) 25 MG tablet Take 0.5 tablets (12.5 mg total) by mouth 2 (two) times daily. 10/07/19   Dixie Dials, MD  montelukast (SINGULAIR) 10 MG tablet Take 10 mg by mouth at bedtime.  09/03/19   [provider]  nitroGLYCERIN (NITROSTAT) 0.4 MG SL tablet Place 1 tablet (0.4 mg total) under the tongue every 5 (five) minutes x 3 doses as needed for chest pain. 09/18/19   Dixie Dials, MD  pantoprazole (PROTONIX) 40 MG tablet Take 1 tablet (40 mg total) by mouth daily at 12 noon. Patient taking differently: Take 40 mg by mouth every other day. 10/07/19   Dixie Dials, MD  sertraline (ZOLOFT) 100 MG tablet Take 150 mg by mouth daily. 07/21/19   [provider]  SYMBICORT 160-4.5 MCG/ACT inhaler Inhale 2 puffs into the lungs 2 (two) times daily. 09/02/19   [provider]    Allergies    Patient has no known allergies.  Review of Systems   Review of Systems  Constitutional:  Negative for fever.  Respiratory:  Positive for cough, shortness of breath and wheezing.   Cardiovascular:  Positive for chest pain and palpitations. Negative for leg swelling.  Gastrointestinal:  Positive for nausea. Negative for vomiting.  Neurological:  Negative for dizziness and syncope.  Psychiatric/Behavioral:  The patient is nervous/anxious.   All other systems reviewed and are negative.  Physical Exam Updated Vital Signs BP (!) 151/98    Pulse 85    Temp 99 F (37.2 C)    Resp 17    Ht 5' 5.5" (1.664 m)    Wt 88 kg    SpO2 98%    BMI 31.79 kg/m   Physical Exam Vitals and nursing note reviewed.  Constitutional:      General: She is not in acute distress.    Appearance: She is well-developed. She is not toxic-appearing.  HENT:     Head: Normocephalic and atraumatic.  Eyes:     General:        Right eye: No discharge.        Left eye: No discharge.     Conjunctiva/sclera: Conjunctivae normal.  Cardiovascular:     Rate and Rhythm: Normal rate  and regular rhythm.     Pulses:          Radial pulses are 2+ on the right side and 2+ on the left side.  Pulmonary:     Effort: Pulmonary effort is normal. No respiratory distress.     Breath sounds: No rhonchi or rales.  Abdominal:     General: There is no distension.     Palpations: Abdomen is soft.     Tenderness: There is no abdominal tenderness.  Musculoskeletal:     Cervical back: Neck supple.     Right lower leg: No tenderness. No  edema.     Left lower leg: No tenderness. No edema.  Skin:    General: Skin is warm and dry.     Findings: No rash.  Neurological:     Mental Status: She is alert.     Comments: Clear speech.   Psychiatric:        Behavior: Behavior normal.    ED Results / Procedures / Treatments   Labs (all labs ordered are listed, but only abnormal results are displayed) Labs Reviewed  CBC WITH DIFFERENTIAL/PLATELET - Abnormal; Notable for the following components:      Result Value   WBC 14.3 (*)    Neutro Abs 9.0 (*)    Abs Immature Granulocytes 0.09 (*)    All other components within normal limits  BASIC METABOLIC PANEL - Abnormal; Notable for the following components:   Glucose, Bld 127 (*)    Creatinine, Ser 1.10 (*)    GFR, Estimated 59 (*)    All other components within normal limits  TROPONIN I (HIGH SENSITIVITY) - Abnormal; Notable for the following components:   Troponin I (High Sensitivity) 992 (*)    All other components within normal limits  RESP PANEL BY RT-PCR (FLU A&B, COVID) ARPGX2  HEMOGLOBIN A1C  PROTIME-INR  APTT  LIPID PANEL  TROPONIN I (HIGH SENSITIVITY)    EKG  Initial EKG:     Repeat EKG:  EKG Interpretation  Date/Time:  Wednesday July 20 2021 02:34:15 EST Ventricular Rate:  73 PR Interval:  148 QRS Duration: 110 QT Interval:  444 QTC Calculation: 490 R Axis:   79 Text Interpretation: Sinus rhythm Anteroseptal infarct, age indeterminate Lateral leads are also involved Confirmed by Veryl Speak [phone])  on 07/20/2021 3:00:03 AM  Radiology DG Chest 2 View  Result Date: 07/20/2021 CLINICAL DATA:  Chest pain, congestion, wheezing EXAM: CHEST - 2 VIEW COMPARISON:  10/03/2019 FINDINGS: Lungs are clear.  No pleural effusion or pneumothorax. The heart is normal in size. Visualized osseous structures are within normal limits. IMPRESSION: Normal chest radiographs. Electronically Signed   By: Julian Hy M.D.   On: 07/20/2021 02:11    Procedures .Critical Care Performed by: Amaryllis Dyke, PA-C Authorized by: Amaryllis Dyke, PA-C    CRITICAL CARE Performed by: Kennith Maes   Total critical care time: 30 minutes  Critical care time was exclusive of separately billable procedures and treating other patients.  Critical care was necessary to treat or prevent imminent or life-threatening deterioration.  Critical care was time spent personally by me on the following activities: development of treatment plan with patient and/or surrogate as well as nursing, discussions with consultants, evaluation of patient's response to treatment, examination of patient, obtaining history from patient or surrogate, ordering and performing treatments and interventions, ordering and review of laboratory studies, ordering and review of radiographic studies, pulse oximetry and re-evaluation of patient's condition.   Medications Ordered in ED Medications  0.9 %  sodium chloride infusion (has no administration in time range)  aspirin chewable tablet 324 mg (has no administration in time range)  heparin injection 4,000 Units (has no administration in time range)  nitroGLYCERIN (NITROGLYN) 2 % ointment 1 inch (has no administration in time range)    ED Course  I have reviewed the triage vital signs and the nursing notes.  Pertinent labs & imaging results that were available during my care of the patient were reviewed by me and considered in my medical decision making (see chart for  details).  MDM Rules/Calculators/A&P                           Patient presents to the emergency department with chest pain. Patient nontoxic appearing, vitals w/ elevated BP.  Additional history obtained:  Additional history obtained from chart review & nursing note review.  Prior heart catheterization reviewed: N/A Prior NM myocar Multi w/ spect w/ wall motion/EF 09/15/2019: 1. No reversible ischemia or infarction. 2. Normal left ventricular wall motion.3. Left ventricular ejection fraction 68%4. Non invasive risk stratification*: Low Prior echocardiogram 10/07/19: EF 55-60% Patient's primary cardiologist: Dr. Doylene Canard.   Lab Tests:  I reviewed and interpreted labs, which included:  CBC: Mild leukocytosis BMP: Similar to prior.  Troponin: Elevated @ 992.   Imaging Studies ordered:  I ordered imaging studies which included CXR, I independently reviewed, formal radiology impression shows: Normal chest radiographs.  ED Course:  02:20: Patient with elevated troponin while in the waiting room therefore she was moved to exam room in acute care ED--> Upon review of patient's EKG w/ attending Dr. Leonette Monarch CODE STEMI activated- secretary made aware to page out to cardiology service. Additional ddx considered including PE, pneumonia, pneumothorax, pericarditis, anxiety, dissection, MSK pain.   Aspirin & heparin ordered. No hx of prior bleeding problems per patient. She is currently reporting discomfort is a 2/10 in severity- 1 inch nitro paste ordered as well. Cardiology fellow @ bedside. Case was discussed with STEMI attending Dr. Einar Gip- plan to take to cath lab. Patient updated on results & plan of care- in agreement.   This is a shared visit with supervising physician Dr. Leonette Monarch who has independently evaluated patient & provided guidance in evaluation/management/disposition, in agreement with care   03:05: Patient transported to cath lab.   Portions of this note were generated with  Lobbyist. Dictation errors may occur despite best attempts at proofreading.  Final Clinical Impression(s) / ED Diagnoses Final diagnoses:  ST elevation myocardial infarction (STEMI), unspecified artery [facility])    Rx / DC Orders ED Discharge Orders     None        Amaryllis Dyke, PA-C 07/20/21 0321    Fatima Blank, MD 07/20/21 [phone]

## 2021-07-20 NOTE — Progress Notes (Signed)
Ref: Deland Pretty, MD   Subjective:  Feeling better. Had stents in proximal LAD and distal LCX early this AM. Echocardiogram pending. EKG shows septal infarct and infero- lateral ischemia VS stable.  Objective:  Vital Signs in the last 24 hours: Temp:  [97.9 F (36.6 C)-99 F (37.2 C)] 97.9 F (36.6 C) (12/14 0841) Pulse Rate:  [0-85] 73 (12/14 0600) Resp:  [0-31] 18 (12/14 0600) BP: (118-151)/(68-98) 130/74 (12/14 0600) SpO2:  [0 %-100 %] 94 % (12/14 0600) Weight:  [88 kg-90.5 kg] 90.5 kg (12/14 0405)  Physical Exam: BP Readings from Last 1 Encounters:  07/20/21 130/74     Wt Readings from Last 1 Encounters:  07/20/21 90.5 kg    Weight change:  Body mass index is 32.7 kg/m. HEENT: [place]/AT, Eyes-Hazel, Conjunctiva-Pink, Sclera-Non-icteric Neck: No JVD, No bruit, Trachea midline. Lungs:  Clear, Bilateral. Cardiac:  Regular rhythm, normal S1 and S2, no S3. II/VI systolic murmur. Abdomen:  Soft, non-tender. BS present. Extremities:  No edema present. No cyanosis. No clubbing. CNS: AxOx3, Cranial nerves grossly intact, moves all 4 extremities.  Skin: Warm and dry.   Intake/Output from previous day: 12/13 0701 - 12/14 0700 In: 68.5 [I.V.:68.5] Out: 700 [Urine:700]    Lab Results: BMET    Component Value Date/Time   NA 136 07/20/2021 0611   NA 135 07/20/2021 0109   NA 139 10/05/2019 0220   K 4.0 07/20/2021 0611   K 4.2 07/20/2021 0109   K 4.1 10/05/2019 0220   CL 105 07/20/2021 0611   CL 103 07/20/2021 0109   CL 105 10/05/2019 0220   CO2 24 07/20/2021 0611   CO2 24 07/20/2021 0109   CO2 24 10/05/2019 0220   GLUCOSE 110 (H) 07/20/2021 0611   GLUCOSE 127 (H) 07/20/2021 0109   GLUCOSE 105 (H) 10/05/2019 0220   BUN 15 07/20/2021 0611   BUN 19 07/20/2021 0109   BUN 13 10/05/2019 0220   CREATININE 0.98 07/20/2021 0611   CREATININE 1.10 (H) 07/20/2021 0109   CREATININE 0.99 10/05/2019 0220   CALCIUM 8.7 (L) 07/20/2021 0611   CALCIUM 9.4 07/20/2021 0109    CALCIUM 8.7 (L) 10/05/2019 0220   GFRNONAA >60 07/20/2021 0611   GFRNONAA 59 (L) 07/20/2021 0109   GFRNONAA >60 10/05/2019 0220   GFRNONAA >60 10/03/2019 0534   GFRNONAA >60 10/02/2019 1854   GFRAA >60 10/05/2019 0220   GFRAA >60 10/03/2019 0534   GFRAA >60 10/02/2019 1854   CBC    Component Value Date/Time   WBC 14.5 (H) 07/20/2021 0611   RBC 4.92 07/20/2021 0611   HGB 13.7 07/20/2021 0611   HCT 40.6 07/20/2021 0611   PLT 217 07/20/2021 0611   MCV 82.5 07/20/2021 0611   MCH 27.8 07/20/2021 0611   MCHC 33.7 07/20/2021 0611   RDW 13.7 07/20/2021 0611   LYMPHSABS 3.7 07/20/2021 0109   MONOABS 1.0 07/20/2021 0109   EOSABS 0.4 07/20/2021 0109   BASOSABS 0.1 07/20/2021 0109   HEPATIC Function Panel No results for input(s): PROT in the last 8760 hours.  Invalid input(s):  ALBUMIN,  AST,  ALT,  ALKPHOS,  BILIDIR,  IBILI HEMOGLOBIN A1C No components found for: HGA1C,  MPG CARDIAC ENZYMES No results found for: CKTOTAL, CKMB, CKMBINDEX, TROPONINI BNP No results for input(s): PROBNP in the last 8760 hours. TSH Recent Labs    07/20/21 0400  TSH 3.134   CHOLESTEROL Recent Labs    07/20/21 0226 07/20/21 0400  CHOL 165 148    Scheduled Meds:  aspirin  81 mg Oral Daily   atorvastatin  80 mg Oral Daily   loratadine  10 mg Oral Daily   metoprolol succinate  25 mg Oral Daily   montelukast  10 mg Oral QHS   pantoprazole  40 mg Oral Q1200   sacubitril-valsartan  1 tablet Oral BID   sertraline  150 mg Oral Daily   sodium chloride flush  3 mL Intravenous Q12H   ticagrelor  90 mg Oral BID   Continuous Infusions:  sodium chloride     PRN Meds:.sodium chloride, acetaminophen, albuterol, ALPRAZolam, nitroGLYCERIN, ondansetron (ZOFRAN) IV, sodium chloride flush  Assessment/Plan:  Acute anterior wall MI S/P LAD and LCX stents Ischemic cardiomyopathy Early systolic left heart failure, HFrEF Hyperglycemia H/O pericarditis HTN Obesity  Plan: Continue Brilinta,  Entresto. Add Jardiance. Awaiting echocardiogram. Cardiac rehab. II   LOS: 0 days   Time spent including chart review, lab review, examination, discussion with patient/Daughter/Pharmacy/Nurse : 35 min   Orpah Cobb  MD  07/20/2021, 9:46 AM

## 2021-07-20 NOTE — Progress Notes (Signed)
CARDIAC REHAB PHASE I   Pt assisted to [facility] and back to bed. Began MI/stent education with pt and daughter. Pt educated on importance of ASA, Brilinta, statin, and NTG. Pt given MI book, stent cards, and heart healthy diet. Reviewed site care and restrictions. Pt denies further questions or concerns at this time. Will f/u tomorrow to review echo and finish education.   [id number] Reynold Bowen, RN BSN 07/20/2021 1:31 PM

## 2021-07-20 NOTE — Progress Notes (Signed)
°  Echocardiogram 2D Echocardiogram has been performed.  Gerda Diss 07/20/2021, 11:57 AM

## 2021-07-20 NOTE — ED Triage Notes (Signed)
Pt here for Chest Pain that's been ongoing on since 1800 since yesterday. Pt stated that she recently had an URI and was taking albuterol every 4 hours. Patient also stating that she was anxious and also presents with wheezing. Pt states that she's still having to use her albuterol every 4 hours.

## 2021-07-21 ENCOUNTER — Other Ambulatory Visit (HOSPITAL_COMMUNITY): Payer: Self-pay

## 2021-07-21 LAB — TROPONIN I (HIGH SENSITIVITY): Troponin I (High Sensitivity): 4263 ng/L (ref <18)

## 2021-07-21 MED ORDER — METOPROLOL SUCCINATE ER 50 MG PO TB24
50.0000 mg | ORAL_TABLET | Freq: Every day | ORAL | Status: DC
  Administered 2021-07-22: 50 mg via ORAL
  Filled 2021-07-21: qty 1

## 2021-07-21 MED ORDER — LIVING BETTER WITH HEART FAILURE BOOK: Freq: Once | Status: AC

## 2021-07-21 MED ORDER — AZELASTINE-FLUTICASONE 137-50 MCG/ACT NA SUSP
NASAL | 1 refills | Status: DC
  Filled 2021-07-21: qty 23, 30d supply, fill #0

## 2021-07-21 NOTE — Progress Notes (Signed)
Ref: Bonnie Brunette, MD   Subjective:  Feeling better, Mild Tachycardia persist. Echocardiogram with anterior and apical hypokinesia. LVEF 40-45 %.  Objective:  Vital Signs in the last 24 hours: Temp:  [98.9 F (37.2 C)-99.3 F (37.4 C)] 99.1 F (37.3 C) (12/15 0819) Pulse Rate:  [80-104] 88 (12/15 0600) Resp:  [16-27] 17 (12/15 0600) BP: (93-157)/(57-98) 109/76 (12/15 0600) SpO2:  [93 %-97 %] 96 % (12/15 0600) Weight:  [88 kg] 88 kg (12/15 0528)  Physical Exam: BP Readings from Last 1 Encounters:  07/21/21 109/76     Wt Readings from Last 1 Encounters:  07/21/21 88 kg    Weight change: 0.002 kg Body mass index is 31.79 kg/m. HEENT: [place]/AT, Eyes-Blue, Conjunctiva-Pink, Sclera-Non-icteric Neck: No JVD, No bruit, Trachea midline. Lungs:  Clear, Bilateral. Cardiac:  Regular rhythm, normal S1 and S2, no S3. II/VI systolic murmur. Abdomen:  Soft, non-tender. BS present. Extremities:  No edema present. No cyanosis. No clubbing. CNS: AxOx3, Cranial nerves grossly intact, moves all 4 extremities.  Skin: Warm and dry.   Intake/Output from previous day: 12/14 0701 - 12/15 0700 In: 1080 [P.O.:1080] Out: 500 [Urine:500]    Lab Results: BMET    Component Value Date/Time   NA 136 07/20/2021 0611   NA 135 07/20/2021 0109   NA 139 10/05/2019 0220   K 4.0 07/20/2021 0611   K 4.2 07/20/2021 0109   K 4.1 10/05/2019 0220   CL 105 07/20/2021 0611   CL 103 07/20/2021 0109   CL 105 10/05/2019 0220   CO2 24 07/20/2021 0611   CO2 24 07/20/2021 0109   CO2 24 10/05/2019 0220   GLUCOSE 110 (H) 07/20/2021 0611   GLUCOSE 127 (H) 07/20/2021 0109   GLUCOSE 105 (H) 10/05/2019 0220   BUN 15 07/20/2021 0611   BUN 19 07/20/2021 0109   BUN 13 10/05/2019 0220   CREATININE 0.98 07/20/2021 0611   CREATININE 1.10 (H) 07/20/2021 0109   CREATININE 0.99 10/05/2019 0220   CALCIUM 8.7 (L) 07/20/2021 0611   CALCIUM 9.4 07/20/2021 0109   CALCIUM 8.7 (L) 10/05/2019 0220   GFRNONAA >60 07/20/2021  0611   GFRNONAA 59 (L) 07/20/2021 0109   GFRNONAA >60 10/05/2019 0220   GFRNONAA >60 10/03/2019 0534   GFRNONAA >60 10/02/2019 1854   GFRAA >60 10/05/2019 0220   GFRAA >60 10/03/2019 0534   GFRAA >60 10/02/2019 1854   CBC    Component Value Date/Time   WBC 14.5 (H) 07/20/2021 0611   RBC 4.92 07/20/2021 0611   HGB 13.7 07/20/2021 0611   HCT 40.6 07/20/2021 0611   PLT 217 07/20/2021 0611   MCV 82.5 07/20/2021 0611   MCH 27.8 07/20/2021 0611   MCHC 33.7 07/20/2021 0611   RDW 13.7 07/20/2021 0611   LYMPHSABS 3.7 07/20/2021 0109   MONOABS 1.0 07/20/2021 0109   EOSABS 0.4 07/20/2021 0109   BASOSABS 0.1 07/20/2021 0109   HEPATIC Function Panel No results for input(s): PROT in the last 8760 hours.  Invalid input(s):  ALBUMIN,  AST,  ALT,  ALKPHOS,  BILIDIR,  IBILI HEMOGLOBIN A1C No components found for: HGA1C,  MPG CARDIAC ENZYMES No results found for: CKTOTAL, CKMB, CKMBINDEX, TROPONINI BNP No results for input(s): PROBNP in the last 8760 hours. TSH Recent Labs    07/20/21 0400  TSH 3.134   CHOLESTEROL Recent Labs    07/20/21 0226 07/20/21 0400  CHOL 165 148    Scheduled Meds:  aspirin  81 mg Oral Daily   atorvastatin  80 mg Oral Daily   empagliflozin  10 mg Oral Daily   loratadine  10 mg Oral Daily   metoprolol succinate  50 mg Oral Daily   montelukast  10 mg Oral QHS   pantoprazole  40 mg Oral Q1200   sacubitril-valsartan  1 tablet Oral BID   sertraline  150 mg Oral Daily   sodium chloride flush  3 mL Intravenous Q12H   ticagrelor  90 mg Oral BID   Continuous Infusions:  sodium chloride     PRN Meds:.sodium chloride, acetaminophen, albuterol, ALPRAZolam, azelastine **AND** fluticasone, ipratropium, nitroGLYCERIN, ondansetron (ZOFRAN) IV, sodium chloride flush  Assessment/Plan:  Acute anterior wall MI S/P LAD and LCx stents Ischemic cardiomyopathy Early systolic left heart failure HTN Obesity H/O pericarditis  Plan: Increase Toprol  dose. Transfer to tele bed. Increase activity.   LOS: 1 day   Time spent including chart review, lab review, examination, discussion with patient :  min   Dixie Dials  MD  07/21/2021, 10:01 AM

## 2021-07-21 NOTE — Plan of Care (Signed)

## 2021-07-21 NOTE — Progress Notes (Signed)
°  Transition of Care [facility]) Screening Note   Patient Details  Name: KHYRA VISCUSO Date of Birth: Jul 26, 1966   Transition of Care [facility]) CM/SW Contact:    Delilah Shan, LCSWA Phone Number: 07/21/2021, 4:42 PM    Transition of Care Department [facility]) has reviewed patient and no TOC needs have been identified at this time. We will continue to monitor patient advancement through interdisciplinary progression rounds. If new patient transition needs arise, please place a TOC consult.

## 2021-07-21 NOTE — TOC Benefit Eligibility Note (Signed)
Patient Advocate Encounter  Prior Authorization for Entresto 24-26 mg has been approved.    PA# [id number] Effective dates: 07/20/2021 through 07/20/2022  Patients co-pay is $30.00.     Roland Earl, CPhT Pharmacy Patient Advocate Specialist [facility] Patient Advocate Team Direct Number: [phone]  Fax: [phone]

## 2021-07-21 NOTE — Discharge Instructions (Signed)

## 2021-07-21 NOTE — Progress Notes (Signed)
CARDIAC REHAB PHASE I   PRE:  Rate/Rhythm: 100 ST  BP:  Sitting: 114/82      SaO2: 97 RA  MODE:  Ambulation: 740 ft   POST:  Rate/Rhythm: 115 ST  BP:  Sitting: 129/63    SaO2: 98 RA   Pt eager to ambulate. Pt ambulated 744ft in hallway standby assist with steady gait. Pt able to converse throughout the walk. Pt denies CP, or dizziness, mild SOB upon return to room. Pt returned to bed. Reinforced education. Pt and spouse deny questions or concerns at this time. Encouraged daily weights and monitoring salt intake. Plan to transfer out of IC U today, will continue to follow. Will refer to CRP II [place].  [id number] Reynold Bowen, RN BSN 07/21/2021 11:00 AM

## 2021-07-22 ENCOUNTER — Encounter (HOSPITAL_COMMUNITY): Payer: Self-pay | Admitting: Cardiology

## 2021-07-22 ENCOUNTER — Other Ambulatory Visit (HOSPITAL_COMMUNITY): Payer: Self-pay

## 2021-07-22 LAB — CBC WITH DIFFERENTIAL/PLATELET
Abs Immature Granulocytes: 0.07 10*3/uL (ref 0.00–0.07)
Basophils Absolute: 0.1 10*3/uL (ref 0.0–0.1)
Basophils Relative: 1 %
Eosinophils Absolute: 0.3 10*3/uL (ref 0.0–0.5)
Eosinophils Relative: 2 %
HCT: 43.1 % (ref 36.0–46.0)
Hemoglobin: 13.9 g/dL (ref 12.0–15.0)
Immature Granulocytes: 1 %
Lymphocytes Relative: 20 %
Lymphs Abs: 2.8 10*3/uL (ref 0.7–4.0)
MCH: 27 pg (ref 26.0–34.0)
MCHC: 32.3 g/dL (ref 30.0–36.0)
MCV: 83.7 fL (ref 80.0–100.0)
Monocytes Absolute: 1.2 10*3/uL — ABNORMAL HIGH (ref 0.1–1.0)
Monocytes Relative: 9 %
Neutro Abs: 9.3 10*3/uL — ABNORMAL HIGH (ref 1.7–7.7)
Neutrophils Relative %: 67 %
Platelets: 264 10*3/uL (ref 150–400)
RBC: 5.15 MIL/uL — ABNORMAL HIGH (ref 3.87–5.11)
RDW: 13.9 % (ref 11.5–15.5)
WBC: 13.8 10*3/uL — ABNORMAL HIGH (ref 4.0–10.5)
nRBC: 0 % (ref 0.0–0.2)

## 2021-07-22 LAB — BASIC METABOLIC PANEL
Anion gap: 8 (ref 5–15)
BUN: 18 mg/dL (ref 6–20)
CO2: 23 mmol/L (ref 22–32)
Calcium: 8.8 mg/dL — ABNORMAL LOW (ref 8.9–10.3)
Chloride: 106 mmol/L (ref 98–111)
Creatinine, Ser: 1.17 mg/dL — ABNORMAL HIGH (ref 0.44–1.00)
GFR, Estimated: 54 mL/min — ABNORMAL LOW (ref >60)
Glucose, Bld: 106 mg/dL — ABNORMAL HIGH (ref 70–99)
Potassium: 4 mmol/L (ref 3.5–5.1)
Sodium: 137 mmol/L (ref 135–145)

## 2021-07-22 LAB — TROPONIN I (HIGH SENSITIVITY): Troponin I (High Sensitivity): 1738 ng/L (ref <18)

## 2021-07-22 LAB — BRAIN NATRIURETIC PEPTIDE: B Natriuretic Peptide: 538.9 pg/mL — ABNORMAL HIGH (ref 0.0–100.0)

## 2021-07-22 MED ORDER — TICAGRELOR 90 MG PO TABS
90.0000 mg | ORAL_TABLET | Freq: Two times a day (BID) | ORAL | 6 refills | Status: DC
  Filled 2021-07-22: qty 60, 30d supply, fill #0
  Filled 2021-08-16: qty 60, 30d supply, fill #1
  Filled 2021-08-16: qty 60, 30d supply, fill #0
  Filled 2021-09-15: qty 60, 30d supply, fill #1
  Filled 2021-10-17: qty 60, 30d supply, fill #2
  Filled 2021-11-12: qty 60, 30d supply, fill #3
  Filled 2021-12-16: qty 60, 30d supply, fill #4
  Filled 2022-01-15: qty 60, 30d supply, fill #5

## 2021-07-22 MED ORDER — ALPRAZOLAM 0.25 MG PO TABS
ORAL_TABLET | ORAL | 1 refills | Status: AC
  Filled 2021-07-22: qty 90, 30d supply, fill #0

## 2021-07-22 MED ORDER — EMPAGLIFLOZIN 10 MG PO TABS
10.0000 mg | ORAL_TABLET | Freq: Every day | ORAL | 6 refills | Status: DC
  Filled 2021-07-22: qty 14, 14d supply, fill #0
  Filled 2021-07-29: qty 30, 30d supply, fill #0
  Filled 2021-09-05: qty 30, 30d supply, fill #1
  Filled 2021-10-04: qty 30, 30d supply, fill #2
  Filled 2021-11-02: qty 30, 30d supply, fill #3
  Filled 2021-11-28: qty 30, 30d supply, fill #4
  Filled 2021-12-02: qty 30, 30d supply, fill #0
  Filled 2021-12-31: qty 30, 30d supply, fill #1
  Filled 2022-01-04: qty 30, 30d supply, fill #0
  Filled 2022-01-30: qty 30, 30d supply, fill #1

## 2021-07-22 MED ORDER — ATORVASTATIN CALCIUM 80 MG PO TABS
80.0000 mg | ORAL_TABLET | Freq: Every day | ORAL | 6 refills | Status: DC
Start: 2021-07-23 — End: 2022-02-17
  Filled 2021-07-22: qty 30, 30d supply, fill #0
  Filled 2021-08-16: qty 30, 30d supply, fill #1
  Filled 2021-08-16: qty 30, 30d supply, fill #0
  Filled 2021-09-15: qty 30, 30d supply, fill #1
  Filled 2021-10-17: qty 30, 30d supply, fill #2
  Filled 2021-11-12: qty 30, 30d supply, fill #3
  Filled 2021-12-16: qty 30, 30d supply, fill #4
  Filled 2022-01-15: qty 30, 30d supply, fill #5

## 2021-07-22 MED ORDER — SACUBITRIL-VALSARTAN 24-26 MG PO TABS
1.0000 | ORAL_TABLET | Freq: Two times a day (BID) | ORAL | 6 refills | Status: DC
  Filled 2021-07-22: qty 60, 30d supply, fill #0
  Filled 2021-08-16: qty 60, 30d supply, fill #1
  Filled 2021-08-16: qty 60, 30d supply, fill #0
  Filled 2021-09-15: qty 60, 30d supply, fill #1
  Filled 2021-10-17: qty 60, 30d supply, fill #2
  Filled 2021-11-12: qty 60, 30d supply, fill #3
  Filled 2021-12-18: qty 60, 30d supply, fill #4
  Filled 2022-01-15: qty 60, 30d supply, fill #5

## 2021-07-22 MED ORDER — METOPROLOL SUCCINATE ER 50 MG PO TB24
50.0000 mg | ORAL_TABLET | Freq: Every day | ORAL | 6 refills | Status: DC
  Filled 2021-07-22 – 2021-08-16 (×2): qty 30, 30d supply, fill #0
  Filled 2021-08-16 – 2021-09-15 (×2): qty 30, 30d supply, fill #1
  Filled 2021-10-17: qty 30, 30d supply, fill #2
  Filled 2021-11-12: qty 30, 30d supply, fill #3
  Filled 2021-12-16: qty 2, 2d supply, fill #4
  Filled 2021-12-16: qty 28, 28d supply, fill #4
  Filled 2022-01-15: qty 30, 30d supply, fill #5

## 2021-07-22 NOTE — Progress Notes (Signed)
Heart Failure Navigation Team Progress Note  PCP: [name], MD Primary Cardiologist: Thurnell Lose., MD Admitted from: home with spouse  Past Medical History:  Diagnosis Date   Anxiety    Arthritis    Asthma    Pericarditis    Pneumonia     Social History   Socioeconomic History   Marital status: Married    Spouse name: Lonni Dirden   Number of children: 2   Years of education: Not on file   Highest education level: Not on file  Occupational History   Occupation: middle school math teacher    Comment: [employer]  Tobacco Use   Smoking status: Never   Smokeless tobacco: Never  Vaping Use   Vaping Use: Never used  Substance and Sexual Activity   Alcohol use: Not Currently    Alcohol/week: 3.0 standard drinks    Types: 3 Glasses of wine per week    Comment: OCCASIONAL   Drug use: Never   Sexual activity: Not on file  Other Topics Concern   Not on file  Social History Narrative   Not on file   Social Determinants of Health   Financial Resource Strain: Low Risk    Difficulty of Paying Living Expenses: Not hard at all  Food Insecurity: No Food Insecurity   Worried About Programme researcher, broadcasting/film/video in the Last Year: Never true   Ran Out of Food in the Last Year: Never true  Transportation Needs: No Transportation Needs   Lack of Transportation (Medical): No   Lack of Transportation (Non-Medical): No  Physical Activity: Not on file  Stress: Not on file  Social Connections: Not on file     Heart & Vascular Transition of Care Clinic follow-up: Scheduled for Tuesday 08/02/21 at 3pm.  Confirmed Ms. Miyasaki has transportation.  Immediate social needs: Therapy/counseling resources  HF CSW spoke with Mrs. Feliz Beam at bedside and discussed social needs and Mrs. Moroz reported that with her recent heart attack and life stressors she is interested in some therapy resources to help manage stressors. CSW provided a list of outpatient therapy resources with several  different options. CSW verified the address, phone number, PCP, Cardiologist and encouraged Mrs. Carbon to come to the [facility] appointment 08/02/21 and Mrs. Nowaczyk is fully agreeable.  Georgenia Salim, MSW, LCSWA [phone] Heart Failure Social Worker

## 2021-07-22 NOTE — Progress Notes (Signed)
CARDIAC REHAB PHASE I   PRE:  Rate/Rhythm: 94 SR  BP:  Sitting: 128/74      SaO2: 98 RA  MODE:  Ambulation: 1200 ft   POST:  Rate/Rhythm: 122 ST  BP:  Sitting: 133/86    SaO2: 99 RA   Pt ambulated 1267ft in hallway independently with steady gait. Pt states some work of breath, but denies CP or dizziness. Pt returned to bed. Reinforced meds, daily weights, and stress management. Pt admits to high anxiety around health, provided support and encouragement. Consult placed for HF navigator. Pt hopeful for d/c later today. Referred to [facility] [place].  [id number] Bonnie Bowen, RN BSN 07/22/2021 9:29 AM

## 2021-07-22 NOTE — Progress Notes (Signed)
Heart Failure Nurse Navigator Progress Note  PCP: [name], MD PCP-Cardiologist: Thurnell Lose., MD Admission Diagnosis: MI Admitted from: home with spouse  Presentation:   Bonnie Nelson presented 12/14 with STEMI. Pt resting in bed on room air. Patient interactive with interview process. Pt had already reviewed Living with Heart Failure packet, reviewed information and answered questions. No immediate SW needs noted. Pt daughter is OP Pharmacist with [employer].  Explained benefits of Heart & Vascular Transitions of Care Clinic appointment, patient agreeable.    ECHO/ LVEF: 40-45%, G1DD (NEW)  Clinical Course:  Past Medical History:  Diagnosis Date   Anxiety    Arthritis    Asthma    Pericarditis    Pneumonia      Social History   Socioeconomic History   Marital status: Married    Spouse name: [name]   Number of children: 2   Years of education: Not on file   Highest education level: Not on file  Occupational History   Occupation: middle school math teacher    Comment: [employer]  Tobacco Use   Smoking status: Never   Smokeless tobacco: Never  Vaping Use   Vaping Use: Never used  Substance and Sexual Activity   Alcohol use: Not Currently    Alcohol/week: 3.0 standard drinks    Types: 3 Glasses of wine per week    Comment: OCCASIONAL   Drug use: Never   Sexual activity: Not on file  Other Topics Concern   Not on file  Social History Narrative   Not on file   Social Determinants of Health   Financial Resource Strain: Low Risk    Difficulty of Paying Living Expenses: Not hard at all  Food Insecurity: No Food Insecurity   Worried About Programme researcher, broadcasting/film/video in the Last Year: Never true   Ran Out of Food in the Last Year: Never true  Transportation Needs: No Transportation Needs   Lack of Transportation (Medical): No   Lack of Transportation (Non-Medical): No  Physical Activity: Not on file  Stress: Not on file  Social Connections: Not on  file    High Risk Criteria for Readmission and/or Poor Patient Outcomes: Heart failure hospital admissions (last 6 months): 1  No Show rate: NA Difficult social situation: No Demonstrates medication adherence: Yes Primary Language: English Literacy level: able to read/write and comprehend.  Education Assessment and Provision:  Detailed education and instructions provided on heart failure disease management including the following:  Signs and symptoms of Heart Failure When to call the physician Importance of daily weights Low sodium diet Fluid restriction Medication management Anticipated future follow-up appointments  Patient education given on each of the above topics.  Patient acknowledges understanding via teach back method and acceptance of all instructions.  Education Materials:  "Living Better With Heart Failure" Booklet, HF zone tool, & Daily Weight Tracker Tool.  Patient has scale at home: yes Patient has pill box at home: yes   Barriers of Care:   -new Dx  Considerations/Referrals:   Referral made to Heart Failure Pharmacist Stewardship: no Referral made to Heart Failure CSW/NCM TOC: no Referral made to Heart & Vascular TOC clinic: yes, 12/27 @ 3pm  Items for Follow-up on DC/TOC: -optimize -cont HF education   Ozella Rocks, MSN, RN Heart Failure Nurse Navigator [phone]

## 2021-07-22 NOTE — Discharge Summary (Signed)
Physician Discharge Summary  Patient ID: Bonnie Nelson MRN: [id number] DOB/AGE: [birth year] 55 y.o.  Admit date: 07/20/2021 Discharge date: 07/22/2021  Admission Diagnoses: NSTEMI H/O pericarditis Reactive airway disease  Discharge Diagnoses:  Principal Problem:   Acute anterior wall MI CAD (coronary artery disease), native coronary artery Active Problems:   S/P LAD and LCx stents   Ischemic cardiomyopathy   Early systolic left heart failure, HFrEF   HTN   CKD, II   Obesity   H/O pericarditis  Discharged Condition: good  Hospital Course: 55 years old white female presented with typical angina chest discomfort not improving with xanax use. She then came to ER after talking to me over phone. She had abnormal EKG and elevated troponin I levels. She underwent emergent cardiac catheterization and had 3.5 x 18 mm DES Onyx in proximal LAD and 3.5 x 16 mm Onyx DES stent in distal LCx were placed by Dr. Jacinto Halim with good result.  Post procedure her HS-troponin I peaked at 4263 ng. Her echocardiogram showed anterior wall hypo to akinesia with LVEF of 40-45 %.  Her medications were adjusted with addition of Jardiance and Entresto. Diltiazem was changed to metoprolol XL. She will continue Brilinta and ASA. She was discharged in stable condition with emphasis on diet, fluid restriction, medications and activity. She will see me in 1 week and primary care in 1 month.  Consults: cardiology  Significant Diagnostic Studies: labs: Mild leukocytosis otherwise normal CBC. Normal BMET except mild hyperglycemia and CKD, II. BNP was 495.4 pg. HS-Troponin I was 992 ng. on arrival and peaked at 4263 ng. Lipid panel was near normal with LDL cholesterol  EKG: NSR, acute anterior wall MI.  CXR: Unremarkable.  Echocardiogram: Mild LV systolic dysfunction with significant anterior wall hypokinesia. Mild MR and TR.  Treatments: cardiac meds: Aspirin, Atorvastatin, Entresto, Brilinta, metoprolol and 0.4 mg.  SL NTG as needed.  Discharge Exam: Blood pressure 119/78, pulse 82, temperature 98.3 F (36.8 C), temperature source Oral, resp. rate 18, height 5' 5.5" (1.664 m), weight 88 kg, SpO2 98 %. General appearance: alert, cooperative and appears stated age. Head: Normocephalic, atraumatic. Eyes: Blue eyes, pink conjunctiva, corneas clear.   Neck: No adenopathy, no carotid bruit, no JVD, supple, symmetrical, trachea midline and thyroid not enlarged. Resp: Clear to auscultation bilaterally. Cardio: Regular rate and rhythm, S1, S2 normal, II/VI systolic murmur, no click, rub or gallop. GI: Soft, non-tender; bowel sounds normal; no organomegaly. Extremities: No edema, cyanosis or clubbing. Skin: Warm and dry.  Neurologic: Alert and oriented X 3, normal strength and tone. Normal coordination and gait.  Disposition: Discharge disposition: 01-Home or Self Care       Discharge Instructions     Amb Referral to Cardiac Rehabilitation   Complete by: As directed    Diagnosis:  Coronary Stents STEMI     After initial evaluation and assessments completed: Virtual Based Care may be provided alone or in conjunction with Phase 2 Cardiac Rehab based on patient barriers.: Yes      Allergies as of 07/22/2021   No Known Allergies      Medication List     STOP taking these medications    diltiazem 120 MG tablet Commonly known as: CARDIZEM   fexofenadine 180 MG tablet Commonly known as: ALLEGRA   metoprolol tartrate 25 MG tablet Commonly known as: LOPRESSOR       TAKE these medications    acetaminophen 500 MG tablet Commonly known as: TYLENOL Take 1,000 mg by mouth every  6 (six) hours as needed for mild pain or moderate pain.   albuterol 108 (90 Base) MCG/ACT inhaler Commonly known as: VENTOLIN HFA Inhale 2 puffs into the lungs every 6 (six) hours as needed (Asthma).   ALPRAZolam 0.25 MG tablet Commonly known as: XANAX Take 0.25 mg by mouth 3 (three) times daily as needed for  anxiety.   aspirin EC 81 MG tablet Take 81 mg by mouth daily.   atorvastatin 80 MG tablet Commonly known as: LIPITOR Take 1 tablet (80 mg total) by mouth daily. Start taking on: July 23, 2021   Azelastine-Fluticasone 137-50 MCG/ACT Susp Place 1 spray into both nostrils 2 (two) times daily as needed (allergies).   desloratadine 5 MG tablet Commonly known as: CLARINEX Take 5 mg by mouth daily.   diclofenac Sodium 1 % Gel Commonly known as: VOLTAREN Apply 2 g topically daily.   empagliflozin 10 MG Tabs tablet Commonly known as: JARDIANCE Take 1 tablet (10 mg total) by mouth daily. Start taking on: July 23, 2021   ipratropium 0.06 % nasal spray Commonly known as: ATROVENT Place 2 sprays into both nostrils daily as needed for allergies.   metoprolol succinate 50 MG 24 hr tablet Commonly known as: TOPROL-XL Take 1 tablet (50 mg total) by mouth daily. Take with or immediately following a meal. Start taking on: July 23, 2021   montelukast 10 MG tablet Commonly known as: SINGULAIR Take 10 mg by mouth at bedtime.   nitroGLYCERIN 0.4 MG SL tablet Commonly known as: NITROSTAT Place 1 tablet (0.4 mg total) under the tongue every 5 (five) minutes x 3 doses as needed for chest pain.   pantoprazole 40 MG tablet Commonly known as: PROTONIX Take 1 tablet (40 mg total) by mouth daily at 12 noon. What changed: when to take this   sacubitril-valsartan 24-26 MG Commonly known as: ENTRESTO Take 1 tablet by mouth 2 (two) times daily.   sertraline 100 MG tablet Commonly known as: ZOLOFT Take 150 mg by mouth daily.   Symbicort 160-4.5 MCG/ACT inhaler Generic drug: budesonide-formoterol Inhale 2 puffs into the lungs 2 (two) times daily.   ticagrelor 90 MG Tabs tablet Commonly known as: BRILINTA Take 1 tablet (90 mg total) by mouth 2 (two) times daily.        Follow-up Information     Deland Pretty, MD Follow up in 1 month(s).   Specialty: Internal  Medicine Contact information: [address] [place] [address] [phone]         Dixie Dials, MD Follow up in 1 week(s).   Specialty: Cardiology Contact information: [place] [address] [phone]                 Time spent: Review of old chart, current chart, lab, x-ray, cardiac tests and discussion with patient/Nurse over 60 minutes.  Signed: Birdie Riddle 07/22/2021, 1:11 PM

## 2021-07-26 ENCOUNTER — Telehealth (HOSPITAL_COMMUNITY): Payer: Self-pay

## 2021-07-26 ENCOUNTER — Other Ambulatory Visit (HOSPITAL_COMMUNITY): Payer: Self-pay

## 2021-07-26 NOTE — Telephone Encounter (Signed)
Pharmacy Transitions of Care Follow-up Telephone Call  Date of discharge: 07/22/21  Discharge Diagnosis: STEMI  How have you been since you were released from the hospital?  Patient has been resting since discharge and is doing well. No questions about meds at this time.  Medication changes made at discharge:      START taking: atorvastatin (LIPITOR)  Brilinta (ticagrelor)  Entresto (sacubitril-valsartan)  Jardiance (empagliflozin)  metoprolol succinate (TOPROL-XL)  STOP taking: diltiazem 120 MG tablet (CARDIZEM)  fexofenadine 180 MG tablet (ALLEGRA)  metoprolol tartrate 25 MG tablet (LOPRESSOR)   Medication changes verified by the patient? Yes    Medication Accessibility:  Home Pharmacy:  [facility]  Was the patient provided with refills on discharged medications? Yes   Have all prescriptions been transferred from [facility] to home pharmacy?  Will transfer meds in system as needed  Is the patient able to afford medications? Has insurance Notable copays: Brilinta $30    Medication Review:  TICAGRELOR (BRILINTA) Ticagrelor 90 mg BID initiated on 07/22/21.  - Educated patient on expected duration of therapy of aspirin with ticagrelor.  - Discussed importance of taking medication around the same time every day, - Advised patient of medications to avoid (NSAIDs, aspirin maintenance doses>100 mg daily) - Educated that Tylenol (acetaminophen) will be the preferred analgesic to prevent risk of bleeding  - Emphasized importance of monitoring for signs and symptoms of bleeding (abnormal bruising, prolonged bleeding, nose bleeds, bleeding from gums, discolored urine, black tarry stools)  - Educated patient to notify doctor if shortness of breath or abnormal heartbeat occur - Advised patient to alert all providers of antiplatelet therapy prior to starting a new medication or having a procedure   Follow-up Appointments:  [facility] Hospital f/u appt confirmed? Scheduled to see Cardiologist on  07/27/21  If their condition worsens, is the pt aware to call PCP or go to the Emergency Dept.? yes  Final Patient Assessment: Patient has follow up scheduled and refills at home pharmacy

## 2021-07-27 ENCOUNTER — Other Ambulatory Visit (HOSPITAL_COMMUNITY): Payer: Self-pay

## 2021-07-29 ENCOUNTER — Other Ambulatory Visit (HOSPITAL_COMMUNITY): Payer: Self-pay

## 2021-08-02 ENCOUNTER — Ambulatory Visit (HOSPITAL_COMMUNITY)
Admit: 2021-08-02 | Discharge: 2021-08-02 | Disposition: A | Discharge status: Home / Self Care | Payer: BC Managed Care – PPO | Source: Ambulatory Visit | Attending: Cardiology | Admitting: Cardiology

## 2021-08-02 ENCOUNTER — Other Ambulatory Visit: Payer: Self-pay

## 2021-08-02 ENCOUNTER — Encounter (HOSPITAL_COMMUNITY): Payer: Self-pay

## 2021-08-02 ENCOUNTER — Telehealth (HOSPITAL_COMMUNITY): Payer: Self-pay

## 2021-08-02 ENCOUNTER — Other Ambulatory Visit (HOSPITAL_COMMUNITY): Payer: Self-pay

## 2021-08-02 VITALS — BP 140/78 | HR 77 | Wt 195.6 lb

## 2021-08-02 DIAGNOSIS — Z7984 Long term (current) use of oral hypoglycemic drugs: Secondary | ICD-10-CM | POA: Insufficient documentation

## 2021-08-02 DIAGNOSIS — I2510 Atherosclerotic heart disease of native coronary artery without angina pectoris: Secondary | ICD-10-CM | POA: Diagnosis not present

## 2021-08-02 DIAGNOSIS — Z7902 Long term (current) use of antithrombotics/antiplatelets: Secondary | ICD-10-CM | POA: Insufficient documentation

## 2021-08-02 DIAGNOSIS — Z596 Low income: Secondary | ICD-10-CM | POA: Diagnosis not present

## 2021-08-02 DIAGNOSIS — I255 Ischemic cardiomyopathy: Secondary | ICD-10-CM | POA: Insufficient documentation

## 2021-08-02 DIAGNOSIS — I252 Old myocardial infarction: Secondary | ICD-10-CM | POA: Diagnosis not present

## 2021-08-02 DIAGNOSIS — Z79899 Other long term (current) drug therapy: Secondary | ICD-10-CM | POA: Insufficient documentation

## 2021-08-02 DIAGNOSIS — I214 Non-ST elevation (NSTEMI) myocardial infarction: Secondary | ICD-10-CM

## 2021-08-02 DIAGNOSIS — Z7982 Long term (current) use of aspirin: Secondary | ICD-10-CM | POA: Diagnosis not present

## 2021-08-02 DIAGNOSIS — I309 Acute pericarditis, unspecified: Secondary | ICD-10-CM | POA: Insufficient documentation

## 2021-08-02 DIAGNOSIS — Z955 Presence of coronary angioplasty implant and graft: Secondary | ICD-10-CM | POA: Diagnosis not present

## 2021-08-02 DIAGNOSIS — Z7901 Long term (current) use of anticoagulants: Secondary | ICD-10-CM | POA: Insufficient documentation

## 2021-08-02 DIAGNOSIS — I5022 Chronic systolic (congestive) heart failure: Secondary | ICD-10-CM | POA: Diagnosis not present

## 2021-08-02 DIAGNOSIS — Z9861 Coronary angioplasty status: Secondary | ICD-10-CM

## 2021-08-02 LAB — BASIC METABOLIC PANEL
Anion gap: 11 (ref 5–15)
BUN: 21 mg/dL — ABNORMAL HIGH (ref 6–20)
CO2: 23 mmol/L (ref 22–32)
Calcium: 9.6 mg/dL (ref 8.9–10.3)
Chloride: 107 mmol/L (ref 98–111)
Creatinine, Ser: 1.27 mg/dL — ABNORMAL HIGH (ref 0.44–1.00)
GFR, Estimated: 49 mL/min — ABNORMAL LOW (ref >60)
Glucose, Bld: 95 mg/dL (ref 70–99)
Potassium: 4.7 mmol/L (ref 3.5–5.1)
Sodium: 141 mmol/L (ref 135–145)

## 2021-08-02 MED ORDER — SPIRONOLACTONE 25 MG PO TABS
12.5000 mg | ORAL_TABLET | Freq: Every day | ORAL | 3 refills | Status: DC
  Filled 2021-08-02: qty 45, 90d supply, fill #0
  Filled 2021-10-24: qty 45, 90d supply, fill #1
  Filled 2022-01-15: qty 45, 90d supply, fill #2
  Filled 2022-04-18: qty 45, 90d supply, fill #3
  Filled 2022-07-16: qty 45, 90d supply, fill #4

## 2021-08-02 NOTE — Patient Instructions (Signed)
Great to see and meet you today! Please begin taking Spironolactone 12.5 mg daily--this medication was sent to [facility].  Labwork completed today--we will only call with abnormal values Follow up in 1 week for labs in this clinic. Referral sent to [facility] for follow-up appt.  They should contact you within 2 weeks to schedule and appt. Please see attached for follow -up appt in HF APP Clinic.

## 2021-08-02 NOTE — Progress Notes (Addendum)
HEART & VASCULAR TRANSITION OF CARE CONSULT NOTE     Referring Physician: Dr. Doylene Canard  Primary Care: Deland Pretty, MD Primary Cardiologist: Dr. Doylene Canard   HPI: Referred to clinic by Dr. Doylene Canard  for heart failure consultation.   55 y/o female w/ new systolic heart failure/ischemic CM.   In 10/2020, she was admitted w/ acute pericarditis and large pericardial effusion w/ tamponade, requiring emergent pericardiocentesis. LVEF at the time was hyperdynamic, 65-70%. Pericardial fluid showed elevated protein and high normal glucose levels. It was negative for cultures and AFB stain and positive for reactive mesothelial cells. She was treated w/ high dose ASA and colchicine. Also noted to have brief occurrence of Afib w/ RVR during admission but spontaneously converted to NSR. No indication for anticoagulation (CHA2DS2VASc of 1).   Readmitted 12/22 w/ chest pain and ruled in for NSTEMI.  HS-troponin I peaked at 4263 ng. 2D echo showed newly reduced LVEF,40-45%. RV normal. No pericardial effusion.   Subsequent LHC showed severe 2 vessel CAD,95 to 99% pLAD treated w/ PCI + DES, as well as 95 to 99% LCx lesion also treated w/ PCI +DES. LFEV estimated to be lower by LVG at ~25%. Placed on DAPT w/ ASA + Brilinta, high intensity statin + GDMT for HF w/ Entresto, Jardiance and Toprol XL. Referred to [facility] clinic.   Presents today for f/u. Here w/ her husband. No further angina. No dyspnea w/ ADLs. Reports full med compliance. Tolerating w/o side effects. BP moderately elevated in clinic today, [id number] systolic but per home log, BP readings have been in the low 0000000 systolic. She is anxious regarding new diagnosis. 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    Cardiac Testing   2D Echo 12/22 Left ventricular ejection fraction, by estimation, is 40 to 45%. The left ventricle has mildly decreased function. The left ventricle demonstrates regional wall motion abnormalities (see scoring  diagram/findings for description). There is mild concentric left ventricular hypertrophy. Left ventricular diastolic parameters are consistent with Grade I diastolic dysfunction (impaired relaxation). There is severe hypokinesis of the left ventricular, mid-apical septal wall and anterior wall. 1. Right ventricular systolic function is normal. The right ventricular size is normal. There is normal pulmonary artery systolic pressure. 2. 3. Left atrial size was mild to moderately dilated. 4. The mitral valve is normal in structure. Mild to moderate mitral valve regurgitation. The aortic valve is tricuspid. Aortic valve regurgitation is not visualized. Aortic valve sclerosis is present, with no evidence of aortic valve stenosis. 5. The inferior vena cava is normal in size with greater than 50% respiratory variability, suggesting right atrial pressure of 3 mmHg.  Pericardium: There is no evidence of pericardial effusion.  LHC 12/22 Left Heart Catheterization 07/20/21:  LV: 128/2, EDP 30 mmHg.  Ao 125/71, mean 96 mmHg.  No pressure gradient across the aortic valve. LVEF, global hypokinesis, anterolateral hypokinesis.  Hand contrast injection only performed due to elevated EDP. RCA: Nondominant, small and normal. LM: Nonexistent. LAD: Separate ostia.  Proximal LAD has a smooth 95 to 99% stenosis with TIMI II flow. Successful direct stenting with a 3.5 x 18 mm Onyx frontier DES, stenosis reduced to 0% with TIMI II to TIMI-3 flow improvement. LCx: Dominant.  Large-caliber vessel.  Gives origin to large OM1, small OM 2 and OM 3 is large.  Has a focal 95% to 99% stenosis.  Direct stenting with 3.5 x 16 mm Synergy XD DES, stenosis reduced to 0% with TIMI-3 to TIMI-3  flow.   Review of Systems: [y] = yes, [ ]  = no   General: Weight gain [ ] ; Weight loss [ ] ; Anorexia [ ] ; Fatigue [ ] ; Fever [ ] ; Chills [ ] ; Weakness [ ]   Cardiac: Chest pain/pressure [ ] ; Resting SOB [ ] ; Exertional SOB [ ] ;  Orthopnea [ ] ; Pedal Edema [ ] ; Palpitations [ ] ; Syncope [ ] ; Presyncope [ ] ; Paroxysmal nocturnal dyspnea[ ]   Pulmonary: Cough [ ] ; Wheezing[ ] ; Hemoptysis[ ] ; Sputum [ ] ; Snoring [ ]   GI: Vomiting[ ] ; Dysphagia[ ] ; Melena[ ] ; Hematochezia [ ] ; Heartburn[ ] ; Abdominal pain [ ] ; Constipation [ ] ; Diarrhea [ ] ; BRBPR [ ]   GU: Hematuria[ ] ; Dysuria [ ] ; Nocturia[ ]   Vascular: Pain in legs with walking [ ] ; Pain in feet with lying flat [ ] ; Non-healing sores [ ] ; Stroke [ ] ; TIA [ ] ; Slurred speech [ ] ;  Neuro: Headaches[ ] ; Vertigo[ ] ; Seizures[ ] ; Paresthesias[ ] ;Blurred vision [ ] ; Diplopia [ ] ; Vision changes [ ]   Ortho/Skin: Arthritis [ ] ; Joint pain [ ] ; Muscle pain [ ] ; Joint swelling [ ] ; Back Pain [ ] ; Rash [ ]   Psych: Depression[ ] ; Anxiety[ Y]  Heme: Bleeding problems [ ] ; Clotting disorders [ ] ; Anemia [ ]   Endocrine: Diabetes [ ] ; Thyroid dysfunction[ ]    Past Medical History:  Diagnosis Date   Anxiety    Arthritis    Asthma    Myocardial infarct (HCC)    Pericarditis    Pneumonia     Current Outpatient Medications  Medication Sig Dispense Refill   acetaminophen (TYLENOL) 500 MG tablet Take 1,000 mg by mouth every 6 (six) hours as needed for mild pain or moderate pain.     albuterol (VENTOLIN HFA) 108 (90 Base) MCG/ACT inhaler Inhale 2 puffs into the lungs every 6 (six) hours as needed (Asthma).     ALPRAZolam (XANAX) 0.25 MG tablet Take 1 tablet by mouth 3 times a day as neeed for anxiety 90 tablet 1   aspirin EC 81 MG tablet Take 81 mg by mouth daily.     atorvastatin (LIPITOR) 80 MG tablet Take 1 tablet (80 mg total) by mouth daily. 30 tablet 6   Azelastine-Fluticasone 137-50 MCG/ACT SUSP Place 1 spray into both nostrils 2 (two) times daily as needed (allergies).     desloratadine (CLARINEX) 5 MG tablet Take 5 mg by mouth daily.     diclofenac Sodium (VOLTAREN) 1 % GEL Apply 2 g topically daily.     empagliflozin (JARDIANCE) 10 MG TABS tablet Take 1 tablet (10 mg total)  by mouth daily. 30 tablet 6   ipratropium (ATROVENT) 0.06 % nasal spray Place 2 sprays into both nostrils daily as needed for allergies.     metoprolol succinate (TOPROL-XL) 50 MG 24 hr tablet Take 1 tablet (50 mg total) by mouth daily. Take with or immediately following a meal. 30 tablet 6   montelukast (SINGULAIR) 10 MG tablet Take 10 mg by mouth at bedtime.      nitroGLYCERIN (NITROSTAT) 0.4 MG SL tablet Place 1 tablet (0.4 mg total) under the tongue every 5 (five) minutes x 3 doses as needed for chest pain. 25 tablet 1   pantoprazole (PROTONIX) 40 MG tablet Take 1 tablet (40 mg total) by mouth daily at 12 noon. (Patient taking differently: Take 40 mg by mouth every other day.) 30 tablet 1   sacubitril-valsartan (ENTRESTO) 24-26 MG Take 1 tablet by mouth 2 (two) times daily. 60 tablet  6   sertraline (ZOLOFT) 100 MG tablet Take 150 mg by mouth daily.     spironolactone (ALDACTONE) 25 MG tablet Take 1/2 tablet by mouth daily. 90 tablet 3   SYMBICORT 160-4.5 MCG/ACT inhaler Inhale 2 puffs into the lungs 2 (two) times daily.     ticagrelor (BRILINTA) 90 MG TABS tablet Take 1 tablet (90 mg total) by mouth 2 (two) times daily. 60 tablet 6   No current facility-administered medications for this encounter.    No Known Allergies    Social History   Socioeconomic History   Marital status: Married    Spouse name: Jamayla Reichenberg   Number of children: 2   Years of education: Not on file   Highest education level: Not on file  Occupational History   Occupation: middle school math teacher    Comment: [place]  Tobacco Use   Smoking status: Never   Smokeless tobacco: Never  Vaping Use   Vaping Use: Never used  Substance and Sexual Activity   Alcohol use: Not Currently    Alcohol/week: 3.0 standard drinks    Types: 3 Glasses of wine per week    Comment: OCCASIONAL   Drug use: Never   Sexual activity: Yes  Other Topics Concern   Not on file  Social History Narrative   Not on  file   Social Determinants of Health   Financial Resource Strain: Low Risk    Difficulty of Paying Living Expenses: Not hard at all  Food Insecurity: No Food Insecurity   Worried About Charity fundraiser in the Last Year: Never true   [place] in the Last Year: Never true  Transportation Needs: No Transportation Needs   Lack of Transportation (Medical): No   Lack of Transportation (Non-Medical): No  Physical Activity: Not on file  Stress: Not on file  Social Connections: Not on file  Intimate Partner Violence: Not on file      Family History  Problem Relation Age of Onset   Hypertension Father     Vitals:   08/02/21 1514  BP: 140/78  Pulse: 77  SpO2: 97%  Weight: 88.7 kg (195 lb 9.6 oz)    PHYSICAL EXAM: General:  Well appearing. No respiratory difficulty HEENT: normal Neck: supple. no JVD. Carotids 2+ bilat; no bruits. No lymphadenopathy or thryomegaly appreciated. Cor: PMI nondisplaced. Regular rate & rhythm. No rubs, gallops or murmurs. Lungs: clear Abdomen: soft, nontender, nondistended. No hepatosplenomegaly. No bruits or masses. Good bowel sounds. Extremities: no cyanosis, clubbing, rash, edema Neuro: alert & oriented x 3, cranial nerves grossly intact. moves all 4 extremities w/o difficulty. Affect pleasant.  ECG: not performed    ASSESSMENT & PLAN:  Chronic Systolic Heart Failure - Ischemic CM, in setting of high grade left system CAD as outlined below, now s/p PCI to LAD and LCx - EF on echo 12/22 down to 40-45% (noted to be ~25% by LVG at time of cath, previously 65%). RV normal  - NYHA Class II. Euvolemic on exam  - Add Spiro 12.5 mg daily  - Continue Entresto 24-26 mg bid - Continue Jardiance 10 mg daily  - Continue Toprol XL 50 mg daily  - no need for loop diuretic currently - BMP today and again in 7 days   - Refer to [facility] for further monitoring/med titration until repeat echo. If EF improves/normalizes, can graduate [facility] the [facility] and  continue w/ care w/ cardiology.   2. CAD - recent NSTEMI  12/22. LHC w/ severe 2VCAD, 95-99% pLAD and 95-99% LCx lesion, both lesions treated w/ PCI + DES - stable w/o angina  - DAPT w/ ASA + Brilinta for minimum of 12 months - on high intensisty statin + ? blocker - add MRA, spiro 12.5 mg daily   3. H/o Pericardial Effusion/ Tamponade, 2/2 Acute Pericarditis  - diagnosed 3/22, s/p emergent pericardiocentesis  - completed tx w/ high dose ASA + 3 months of colchicine - recent echo 12/22 showed no effusion   NYHA II GDMT  Diuretic- N/A BB- Toprol XL 50 mg daily  Ace/ARB/ARNI Entresto 24-26 mg bid  MRA Spiro 12.5 mg daily  SGLT2i Jardiance 10 mg daily     Referred to HFSW (PCP, Medications, Transportation, ETOH Abuse, Drug Abuse, Insurance, Museum/gallery curator ): No  Refer to Pharmacy: No  Refer to Home Health: No Refer to Advanced Heart Failure Clinic: Yes Refer to General Cardiology: Yes (shared care)  Follow up in the [facility] w/ APP in 3-4 weeks for further med titration. Plan to assign to Dr. Aundra Dubin. Pt also will need to continue f/u w/ cardiology for CAD. She has requested transferring care to [facility] at [facility]. Will place referral.   Lyda Jester, PA-C 08/02/2021

## 2021-08-02 NOTE — Telephone Encounter (Signed)
Call attempted to confirm HV TOC appt today at 3PM. HIPPA appropriate VM left with callback number.   Ozella Rocks, MSN, RN Heart Failure Nurse Navigator [phone]

## 2021-08-08 ENCOUNTER — Other Ambulatory Visit (HOSPITAL_COMMUNITY): Payer: Self-pay

## 2021-08-09 ENCOUNTER — Other Ambulatory Visit (HOSPITAL_COMMUNITY): Payer: Self-pay

## 2021-08-09 ENCOUNTER — Ambulatory Visit (HOSPITAL_COMMUNITY)
Admission: RE | Admit: 2021-08-09 | Discharge: 2021-08-09 | Disposition: A | Discharge status: Home / Self Care | Payer: BC Managed Care – PPO | Source: Ambulatory Visit | Attending: Internal Medicine | Admitting: Internal Medicine

## 2021-08-09 ENCOUNTER — Other Ambulatory Visit: Payer: Self-pay

## 2021-08-09 DIAGNOSIS — I5022 Chronic systolic (congestive) heart failure: Secondary | ICD-10-CM | POA: Diagnosis not present

## 2021-08-09 LAB — BASIC METABOLIC PANEL
Anion gap: 8 (ref 5–15)
BUN: 16 mg/dL (ref 6–20)
CO2: 24 mmol/L (ref 22–32)
Calcium: 9.1 mg/dL (ref 8.9–10.3)
Chloride: 106 mmol/L (ref 98–111)
Creatinine, Ser: 1.27 mg/dL — ABNORMAL HIGH (ref 0.44–1.00)
GFR, Estimated: 49 mL/min — ABNORMAL LOW (ref >60)
Glucose, Bld: 89 mg/dL (ref 70–99)
Potassium: 4.8 mmol/L (ref 3.5–5.1)
Sodium: 138 mmol/L (ref 135–145)

## 2021-08-09 MED ORDER — LEVALBUTEROL TARTRATE 45 MCG/ACT IN AERO
INHALATION_SPRAY | RESPIRATORY_TRACT | 0 refills | Status: AC
  Filled 2021-08-09: qty 15, 30d supply, fill #0

## 2021-08-09 MED ORDER — CEFDINIR 300 MG PO CAPS
ORAL_CAPSULE | ORAL | 0 refills | Status: DC
  Filled 2021-08-09: qty 20, 10d supply, fill #0

## 2021-08-09 MED ORDER — TRELEGY ELLIPTA 200-62.5-25 MCG/ACT IN AEPB
INHALATION_SPRAY | RESPIRATORY_TRACT | 5 refills | Status: DC
  Filled 2021-08-09: qty 60, 30d supply, fill #0
  Filled 2021-09-15: qty 60, 30d supply, fill #1
  Filled 2021-10-17: qty 60, 30d supply, fill #2
  Filled 2021-11-23: qty 60, 30d supply, fill #3
  Filled 2021-12-16: qty 60, 30d supply, fill #4
  Filled 2022-01-17 (×2): qty 60, 30d supply, fill #5

## 2021-08-09 MED ORDER — BUDESONIDE-FORMOTEROL FUMARATE 160-4.5 MCG/ACT IN AERO
INHALATION_SPRAY | RESPIRATORY_TRACT | 6 refills | Status: DC
  Filled 2021-08-09: qty 10.2, 30d supply, fill #0

## 2021-08-10 ENCOUNTER — Other Ambulatory Visit (HOSPITAL_COMMUNITY): Payer: Self-pay

## 2021-08-10 MED ORDER — AZITHROMYCIN 250 MG PO TABS
ORAL_TABLET | ORAL | 0 refills | Status: DC
  Filled 2021-08-10: qty 6, 5d supply, fill #0

## 2021-08-16 ENCOUNTER — Other Ambulatory Visit (HOSPITAL_COMMUNITY): Payer: Self-pay

## 2021-08-22 ENCOUNTER — Other Ambulatory Visit (HOSPITAL_COMMUNITY): Payer: Self-pay

## 2021-08-29 NOTE — Progress Notes (Signed)
ADVANCED HF CLINIC CONSULT NOTE  Referring Physician: Boyce MediciBrittany Simmons, PA Primary Care: Merri BrunettePharr, Walter, MD HF Cardiologist: Dr. Shirlee LatchMcLean  HPI: Bonnie Nelson is a 56 y.o. female w/ new systolic heart failure/ischemic CM.    In 10/2020, she was admitted w/ acute pericarditis and large pericardial effusion w/ tamponade, requiring emergent pericardiocentesis. LVEF at the time was hyperdynamic, 65-70%. Pericardial fluid showed elevated protein and high normal glucose levels. It was negative for cultures and AFB stain and positive for reactive mesothelial cells. She was treated w/ high dose ASA and colchicine. Also noted to have brief occurrence of Afib w/ RVR during admission but spontaneously converted to NSR. No indication for anticoagulation (CHA2DS2VASc of 1).    Readmitted 12/22 w/ chest pain and ruled in for NSTEMI.  HS-troponin I peaked at 4263 ng. 2D echo showed newly reduced LVEF,40-45%. RV normal. No pericardial effusion.    Subsequent LHC showed severe 2 vessel CAD,95 to 99% pLAD treated w/ PCI + DES, as well as 95 to 99% LCx lesion also treated w/ PCI +DES. LFEV estimated to be lower by LVG at ~25%. Placed on DAPT w/ ASA + Brilinta, high intensity statin + GDMT for HF w/ Entresto, Jardiance and Toprol XL. [facility]    Seen in TOC, GDMT titrated and [facility] establish care for CAD with general cardiology.   Today she presents to establish care with HF clinic, post TOC. Overall feeling fine. She does not have significant exertional dyspnea. Main issue is fatigue, especially at the end of the work day. Occasional lightheadedness if she gets up fast. 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 Denies palpitations, abnormal bleeding, CP, edema, or PND/Orthopnea. Appetite ok. No fever or chills. Weight at home 191.8 pounds. Taking all medications. She snores. Used to walk 3 miles a day this summer, but tore  meniscus and has been more out of shape.     Cardiac Testing  - Echo (12/22): EF 40-45%, mildly decreased function, mild LVH, grade I DD, severe HK of LV/mid apical septal wall and anterior wall, normal RV, mild to moderate MR, no pericardial effusion.     - LHC (12/22): LV: 128/2, EDP 30 mmHg.  Ao 125/71, mean 96 mmHg.  No pressure gradient across the aortic valve. LVEF, global hypokinesis, anterolateral hypokinesis.  Hand contrast injection only performed due to elevated EDP. RCA: Nondominant, small and normal. LM: Nonexistent. LAD: Separate ostia.  Proximal LAD has a smooth 95 to 99% stenosis with TIMI II flow. Successful direct stenting with a 3.5 x 18 mm Onyx frontier DES, stenosis reduced to 0% with TIMI II to TIMI-3 flow improvement. LCx: Dominant.  Large-caliber vessel.  Gives origin to large OM1, small OM 2 and OM 3 is large.  Has a focal 95% to 99% stenosis.  Direct stenting with 3.5 x 16 mm Synergy XD DES, stenosis reduced to 0% with TIMI-3 to TIMI-3 flow.   Review of Systems: [y] = yes, [ ]  = no   General: Weight gain [ ] ; Weight loss [ ] ; Anorexia [ ] ; Fatigue [ y]; Fever [ ] ; Chills [ ] ; Weakness [ ]   Cardiac: Chest pain/pressure [ ] ; Resting SOB [ ] ; Exertional SOB [ ] ; Orthopnea [ ] ; Pedal Edema [ ] ; Palpitations [ ] ; Syncope [ ] ; Presyncope [ ] ; Paroxysmal nocturnal dyspnea[ ]   Pulmonary: Cough [ ] ; Wheezing[ ] ; Hemoptysis[ ] ; Sputum [ ] ;  Snoring [ ]   GI: Vomiting[ ] ; Dysphagia[ ] ; Melena[ ] ; Hematochezia [ ] ; Heartburn[ ] ; Abdominal pain [ ] ; Constipation [ ] ; Diarrhea [ ] ; BRBPR [ ]   GU: Hematuria[ ] ; Dysuria [ ] ; Nocturia[ ]   Vascular: Pain in legs with walking [ ] ; Pain in feet with lying flat [ ] ; Non-healing sores [ ] ; Stroke [ ] ; TIA [ ] ; Slurred speech [ ] ;  Neuro: Headaches[ ] ; Vertigo[ ] ; Seizures[ ] ; Paresthesias[ ] ;Blurred vision [ ] ; Diplopia [ ] ; Vision changes [ ]   Ortho/Skin: Arthritis [ ] ; Joint pain [ ] ; Muscle pain [ ] ; Joint swelling [ ] ; Back Pain [ ] ;  Rash [ ]   Psych: Depression[ ] ; Anxiety[ ]   Heme: Bleeding problems [ ] ; Clotting disorders [ ] ; Anemia [ ]   Endocrine: Diabetes [ ] ; Thyroid dysfunction[ ]   Past Medical History:  Diagnosis Date   Anxiety    Arthritis    Asthma    Myocardial infarct (HCC)    Pericarditis    Pneumonia    Current Outpatient Medications  Medication Sig Dispense Refill   acetaminophen (TYLENOL) 500 MG tablet Take 1,000 mg by mouth every 6 (six) hours as needed for mild pain or moderate pain.     ALPRAZolam (XANAX) 0.25 MG tablet Take 1 tablet by mouth 3 times a day as neeed for anxiety 90 tablet 1   aspirin EC 81 MG tablet Take 81 mg by mouth daily.     atorvastatin (LIPITOR) 80 MG tablet Take 1 tablet (80 mg total) by mouth daily. 30 tablet 6   Azelastine-Fluticasone 137-50 MCG/ACT SUSP Place 1 spray into both nostrils 2 (two) times daily as needed (allergies).     desloratadine (CLARINEX) 5 MG tablet Take 5 mg by mouth daily.     diclofenac Sodium (VOLTAREN) 1 % GEL Apply 2 g topically daily as needed.     empagliflozin (JARDIANCE) 10 MG TABS tablet Take 1 tablet (10 mg total) by mouth daily. 30 tablet 6   Fluticasone-Umeclidin-Vilant (TRELEGY ELLIPTA) 200-62.5-25 MCG/ACT AEPB Inhale 1 puff into the lungs once a day. 60 each 5   ipratropium (ATROVENT) 0.06 % nasal spray Place 2 sprays into both nostrils daily as needed for allergies.     levalbuterol (XOPENEX HFA) 45 MCG/ACT inhaler Inhale 2 puffs into the lungs every 4-6 hours as needed for cough/wheeze. 15 g 0   metoprolol succinate (TOPROL-XL) 50 MG 24 hr tablet Take 1 tablet (50 mg total) by mouth daily. Take with or immediately following a meal. 30 tablet 6   montelukast (SINGULAIR) 10 MG tablet Take 10 mg by mouth at bedtime.      nitroGLYCERIN (NITROSTAT) 0.4 MG SL tablet Place 1 tablet (0.4 mg total) under the tongue every 5 (five) minutes x 3 doses as needed for chest pain. 25 tablet 1   pantoprazole (PROTONIX) 40 MG tablet Take 1 tablet (40 mg  total) by mouth daily at 12 noon. (Patient taking differently: Take 40 mg by mouth at bedtime.) 30 tablet 1   sacubitril-valsartan (ENTRESTO) 24-26 MG Take 1 tablet by mouth 2 (two) times daily. 60 tablet 6   sertraline (ZOLOFT) 100 MG tablet Take 150 mg by mouth daily.     spironolactone (ALDACTONE) 25 MG tablet Take 1/2 tablet by mouth daily. 90 tablet 3   ticagrelor (BRILINTA) 90 MG TABS tablet Take 1 tablet (90 mg total) by mouth 2 (two) times daily. 60 tablet 6   No current facility-administered medications for this encounter.   No  Known Allergies  Social History   Socioeconomic History   Marital status: Married    Spouse name: Elsbeth Friederichs   Number of children: 2   Years of education: Not on file   Highest education level: Not on file  Occupational History   Occupation: middle school math teacher    Comment: [place]  Tobacco Use   Smoking status: Never   Smokeless tobacco: Never  Vaping Use   Vaping Use: Never used  Substance and Sexual Activity   Alcohol use: Not Currently    Alcohol/week: 3.0 standard drinks    Types: 3 Glasses of wine per week    Comment: OCCASIONAL   Drug use: Never   Sexual activity: Yes  Other Topics Concern   Not on file  Social History Narrative   Not on file   Social Determinants of Health   Financial Resource Strain: Low Risk    Difficulty of Paying Living Expenses: Not hard at all  Food Insecurity: No Food Insecurity   Worried About Charity fundraiser in the Last Year: Never true   [place] in the Last Year: Never true  Transportation Needs: No Transportation Needs   Lack of Transportation (Medical): No   Lack of Transportation (Non-Medical): No  Physical Activity: Not on file  Stress: Not on file  Social Connections: Not on file  Intimate Partner Violence: Not on file   Family History  Problem Relation Age of Onset   Hypertension Father    BP 110/82    Pulse 82    Wt 89.3 kg (196 lb 12.8 oz)    SpO2 97%     BMI 32.25 kg/m   Wt Readings from Last 3 Encounters:  08/30/21 89.3 kg (196 lb 12.8 oz)  08/02/21 88.7 kg (195 lb 9.6 oz)  07/21/21 88 kg (194 lb 0.1 oz)   PHYSICAL EXAM: General:  NAD. No resp difficulty HEENT: Normal Neck: Supple. No JVD. Carotids 2+ bilat; no bruits. No lymphadenopathy or thryomegaly appreciated. Cor: PMI nondisplaced. Regular rate & rhythm. No rubs, gallops or murmurs. Lungs: Clear Abdomen: Obese, nontender, nondistended. No hepatosplenomegaly. No bruits or masses. Good bowel sounds. Extremities: No cyanosis, clubbing, rash, edema Neuro: Alert & oriented x 3, cranial nerves grossly intact. Moves all 4 extremities w/o difficulty. Affect pleasant.  ECG: NSR 75 bpm, anterior TWI (personally reviewed).  ASSESSMENT & PLAN: Chronic Systolic Heart Failure, iCM. - Ischemic CM, in setting of high grade left system CAD as outlined below, now s/p PCI to LAD and LCx - Echo (12/22): EF down to 40-45% (noted to be ~25% by LVG at time of cath, previously 65%). RV normal  - NYHA Class II. Euvolemic on exam. With some lightheadedness, will not increase meds today. - Continue Spiro 12.5 mg daily. BMET today. - Continue Entresto 24-26 mg bid - Continue Jardiance 10 mg daily  - Continue Toprol XL 50 mg daily  - no need for loop diuretic currently. - Plan to repeat echo in 3 months after GDMT. If EF improves/normalizes, can graduate from the [facility] and continue w/ care w/ cardiology.    2. CAD - Recent NSTEMI 12/22. LHC w/ severe 2VCAD, 95-99% pLAD and 95-99% LCx lesion, both lesions treated w/ PCI + DES - Stable w/o angina.  - DAPT w/ ASA + Brilinta for minimum of 12 months (~12/23). - On high intensisty statin + ? blocker. - Check lipids next visit. - Refer to CR.   3. H/o Pericardial  Effusion/ Tamponade, 2/2 Acute Pericarditis  - Diagnosed 3/22, s/p emergent pericardiocentesis  - Completed tx w/ high dose ASA + 3 months of colchicine. - Echo 12/22 showed no effusion     4. Obesity - Body mass index is 32.25 kg/m. - With CAD, consider referral to pharmacy for semaglutide +/- healthy weight and wellness.  5. Snoring - Arrange home sleep study  She has been previously referred to [facility] [address] for follow up regarding her CAD.  Follow up with APP in 6 weeks (increase spiro or Entresto, check lipids) and 12 weeks with Dr. Aundra Dubin + echo.   Allena Katz, FNP-BC 08/30/21

## 2021-08-30 ENCOUNTER — Ambulatory Visit (HOSPITAL_COMMUNITY)
Admission: RE | Admit: 2021-08-30 | Discharge: 2021-08-30 | Disposition: A | Discharge status: Home / Self Care | Payer: BC Managed Care – PPO | Source: Ambulatory Visit | Attending: Cardiology | Admitting: Cardiology

## 2021-08-30 ENCOUNTER — Other Ambulatory Visit: Payer: Self-pay

## 2021-08-30 ENCOUNTER — Encounter (HOSPITAL_COMMUNITY): Payer: Self-pay

## 2021-08-30 VITALS — BP 110/82 | HR 82 | Wt 196.8 lb

## 2021-08-30 DIAGNOSIS — Z79899 Other long term (current) drug therapy: Secondary | ICD-10-CM | POA: Insufficient documentation

## 2021-08-30 DIAGNOSIS — Z955 Presence of coronary angioplasty implant and graft: Secondary | ICD-10-CM | POA: Diagnosis not present

## 2021-08-30 DIAGNOSIS — Z7982 Long term (current) use of aspirin: Secondary | ICD-10-CM | POA: Diagnosis not present

## 2021-08-30 DIAGNOSIS — I3139 Other pericardial effusion (noninflammatory): Secondary | ICD-10-CM | POA: Diagnosis not present

## 2021-08-30 DIAGNOSIS — R0683 Snoring: Secondary | ICD-10-CM

## 2021-08-30 DIAGNOSIS — I5022 Chronic systolic (congestive) heart failure: Secondary | ICD-10-CM | POA: Diagnosis present

## 2021-08-30 DIAGNOSIS — Z8249 Family history of ischemic heart disease and other diseases of the circulatory system: Secondary | ICD-10-CM | POA: Diagnosis not present

## 2021-08-30 DIAGNOSIS — Z7902 Long term (current) use of antithrombotics/antiplatelets: Secondary | ICD-10-CM | POA: Diagnosis not present

## 2021-08-30 DIAGNOSIS — I110 Hypertensive heart disease with heart failure: Secondary | ICD-10-CM | POA: Diagnosis not present

## 2021-08-30 DIAGNOSIS — I2510 Atherosclerotic heart disease of native coronary artery without angina pectoris: Secondary | ICD-10-CM | POA: Diagnosis not present

## 2021-08-30 DIAGNOSIS — I252 Old myocardial infarction: Secondary | ICD-10-CM | POA: Diagnosis not present

## 2021-08-30 DIAGNOSIS — E669 Obesity, unspecified: Secondary | ICD-10-CM

## 2021-08-30 DIAGNOSIS — Z9861 Coronary angioplasty status: Secondary | ICD-10-CM

## 2021-08-30 DIAGNOSIS — Z6832 Body mass index (BMI) 32.0-32.9, adult: Secondary | ICD-10-CM | POA: Diagnosis not present

## 2021-08-30 DIAGNOSIS — I255 Ischemic cardiomyopathy: Secondary | ICD-10-CM | POA: Diagnosis not present

## 2021-08-30 LAB — BASIC METABOLIC PANEL
Anion gap: 12 (ref 5–15)
BUN: 23 mg/dL — ABNORMAL HIGH (ref 6–20)
CO2: 23 mmol/L (ref 22–32)
Calcium: 9.5 mg/dL (ref 8.9–10.3)
Chloride: 106 mmol/L (ref 98–111)
Creatinine, Ser: 1.29 mg/dL — ABNORMAL HIGH (ref 0.44–1.00)
GFR, Estimated: 48 mL/min — ABNORMAL LOW (ref >60)
Glucose, Bld: 125 mg/dL — ABNORMAL HIGH (ref 70–99)
Potassium: 4.5 mmol/L (ref 3.5–5.1)
Sodium: 141 mmol/L (ref 135–145)

## 2021-08-30 NOTE — Addendum Note (Signed)
Encounter addended by: Crissie Figures, RN on: 08/30/2021 2:56 PM  Actions taken: Clinical Note Signed

## 2021-08-30 NOTE — Patient Instructions (Addendum)
Medication Changes:  No changes  Lab Work:  Labs done today, your results will be available in MyChart, we will contact you for abnormal readings.   Testing/Procedures:  Your provider has recommended that you have a home sleep study.  We have provided you with the equipment in our office today. Please download the app and follow the instructions. YOUR PIN NUMBER IS: [id number]. Once you have completed the test you just dispose of the equipment, the information is automatically uploaded to [place] via blue-tooth technology. If your test is positive for sleep apnea and you need a home CPAP machine you will be contacted by Dr Norris Cross office [facility]) to set this up. Once approved by insurance you will be called to inform you when to do the study  Your physician has requested that you have an echocardiogram. Echocardiography is a painless test that uses sound waves to create images of your heart. It provides your doctor with information about the size and shape of your heart and how well your hearts chambers and valves are working. This procedure takes approximately one hour. There are no restrictions for this procedure.  Referrals:  You have been referred to Cardiac Rehab   Special Instructions // Education:  none  Follow-Up in: 6 weeks with clinic and 12 weeks with Dr.McLean  At the [facility], you and your health needs are our priority. We have a designated team specialized in the treatment of Heart Failure. This Care Team includes your primary Heart Failure Specialized Cardiologist (physician), Advanced Practice Providers (APPs- Physician Assistants and Nurse Practitioners), and Pharmacist who all work together to provide you with the care you need, when you need it.   You may see any of the following providers on your designated Care Team at your next follow up:  Dr Arvilla Meres Dr Carron Curie, NP Robbie Lis, [place] [facility], [place] Karle Plumber, PharmD   Please be sure to bring in all your medications bottles to every appointment.   Need to Contact us:  If you have any questions or concerns before your next appointment please send [place] a message through [place] or call our office at [phone].    TO LEAVE A MESSAGE FOR THE NURSE SELECT OPTION 2, PLEASE LEAVE A MESSAGE INCLUDING: YOUR NAME DATE OF BIRTH CALL BACK NUMBER REASON FOR CALL**this is important as we prioritize the call backs  YOU WILL RECEIVE A CALL BACK THE SAME DAY AS LONG AS YOU CALL BEFORE 4:00 PM

## 2021-08-30 NOTE — Progress Notes (Signed)
Date:  08/30/2021 STOP BANG RISK ASSESSMENT S (snore) Have you been told that you snore?     YES   T (tired) Are you often tired, fatigued, or sleepy during the day?   YES  O (obstruction) Do you stop breathing, choke, or gasp during sleep? NO   P (pressure) Do you have or are you being treated for high blood pressure? YES   B (BMI) Is your body index greater than 35 kg/m? NO   A (age) Are you 58 years old or older? YES   N (neck) Do you have a neck circumference greater than 16 inches?   NO   G (gender) Are you a female? NO   TOTAL STOP/BANG YES ANSWERS 4

## 2021-08-30 NOTE — Progress Notes (Signed)
Patient given home sleep device and instructions to complete the study.  She will await my call to begin so that we can check insurance pre cert.

## 2021-08-30 NOTE — Addendum Note (Signed)
Encounter addended by: Crissie Figures, RN on: 08/30/2021 3:36 PM  Actions taken: Clinical Note Signed

## 2021-09-05 ENCOUNTER — Other Ambulatory Visit (HOSPITAL_COMMUNITY): Payer: Self-pay

## 2021-09-05 MED ORDER — AZELASTINE-FLUTICASONE 137-50 MCG/ACT NA SUSP
NASAL | 0 refills | Status: DC
  Filled 2021-09-05: qty 23, 30d supply, fill #0

## 2021-09-08 ENCOUNTER — Other Ambulatory Visit (HOSPITAL_COMMUNITY): Payer: Self-pay

## 2021-09-09 ENCOUNTER — Telehealth (HOSPITAL_COMMUNITY): Payer: Self-pay | Admitting: Surgery

## 2021-09-09 NOTE — Telephone Encounter (Signed)
Patient called and informed that insurance pre cert is not required and it is fine to proceed with the ordered home sleep study.

## 2021-09-12 ENCOUNTER — Encounter (INDEPENDENT_AMBULATORY_CARE_PROVIDER_SITE_OTHER): Payer: BC Managed Care – PPO | Admitting: Cardiology

## 2021-09-12 DIAGNOSIS — I2510 Atherosclerotic heart disease of native coronary artery without angina pectoris: Secondary | ICD-10-CM

## 2021-09-12 DIAGNOSIS — I255 Ischemic cardiomyopathy: Secondary | ICD-10-CM

## 2021-09-13 ENCOUNTER — Ambulatory Visit: Payer: BC Managed Care – PPO

## 2021-09-13 ENCOUNTER — Other Ambulatory Visit: Payer: Self-pay

## 2021-09-13 DIAGNOSIS — I2510 Atherosclerotic heart disease of native coronary artery without angina pectoris: Secondary | ICD-10-CM

## 2021-09-13 DIAGNOSIS — R0683 Snoring: Secondary | ICD-10-CM

## 2021-09-13 NOTE — Procedures (Signed)
° °  Sleep Study Report  Patient Information Study Date: 09/12/21 Patient Name: Bonnie Nelson Patient ID: [id number] Birth Date: January 25, 2066 Age: 56 Gender: Female Referring Physician: Marca Ancona, MD  TEST DESCRIPTION: Home sleep apnea testing was completed using the WatchPat, a Type 1 device, utilizing peripheral arterial tonometry (PAT), chest movement, actigraphy, pulse oximetry, pulse rate, body position and snore. AHI was calculated with apnea and hypopnea using valid sleep time as the denominator. RDI includes apneas, hypopneas, and RERAs. The data acquired and the scoring of sleep and all associated events were performed in accordance with the recommended standards and specifications as outlined in the AASM Manual for the Scoring of Sleep and Associated Events 2.2.0 (2015).  FINDINGS: 1. No evidence of Obstructive Sleep Apnea with AHI 2.8/hr. 2. No Central Sleep Apnea. 3. Oxygen desaturations as low as 88%. 4. Mild snoring was present. O2 sats were < 88% for 0.1 minutes. 5. Total sleep time was 7 hrs and 27 min. 6. 17.9% of total sleep time was spent in REM sleep. 7. Normal sleep onset latency at 16 min. 8. Prolonged REM sleep onset latency at 236 min. 9. Total awakenings were 8.  DIAGNOSIS: Normal study with no significant sleep disordered breathing.  RECOMMENDATIONS: 1. Normal study with no significant sleep disordered breathing.  2. Healthy sleep recommendations include: adequate nightly sleep (normal 7-9 hrs/night), avoidance of caffeine after noon and alcohol near bedtime, and maintaining a sleep environment that is cool, dark and quiet.  3. Weight loss for overweight patients is recommended.  4. Snoring recommendations include: weight loss where appropriate, side sleeping, and avoidance of alcohol before bed.  5. Operation of motor vehicle or dangerous equipment must be avoided when feeling drowsy, excessively sleepy, or mentally fatigued.  6. An ENT  consultation which may be useful for specific causes of and possible treatment of bothersome snoring .  7. Weight loss may be of benefit in reducing the severity of snoring.   Signature: Electronically Signed: 09/13/21 Armanda Magic, MD; [facility]; Diplomat, American Board of Sleep Medicine

## 2021-09-15 ENCOUNTER — Other Ambulatory Visit (HOSPITAL_COMMUNITY): Payer: Self-pay

## 2021-09-19 ENCOUNTER — Telehealth (HOSPITAL_COMMUNITY): Payer: Self-pay

## 2021-09-19 NOTE — Telephone Encounter (Signed)
Called patient to see if she was interested in participating in the Cardiac Rehab Program. Patient stated yes. Patient will come in for orientation on 10/11/21 @ 1:15PM and will attend the 3PM exercise class. Went over insurance, patient verbalized understanding.     Pensions consultant.

## 2021-09-19 NOTE — Telephone Encounter (Signed)
Pt insurance is active and benefits verified through [place]. Co-pay $0.00, DED $1,250.00/$463.28 met, out of pocket $4,890.00/$887.34 met, co-insurance 20%. No pre-authorization required. Passport, 09/19/21 @ 4:12PM, GCY#[id number]   Will contact patient to see if she is interested in the Cardiac Rehab Program.

## 2021-09-23 ENCOUNTER — Telehealth: Payer: Self-pay | Admitting: *Deleted

## 2021-09-23 NOTE — Telephone Encounter (Signed)
Patient notified of normal home sleep study.

## 2021-09-23 NOTE — Telephone Encounter (Signed)
-----   Message from Quintella Reichert, MD sent at 09/13/2021 10:20 AM EST ----- Normal home sleep study

## 2021-10-04 ENCOUNTER — Other Ambulatory Visit (HOSPITAL_COMMUNITY): Payer: Self-pay

## 2021-10-04 MED ORDER — DESLORATADINE 5 MG PO TABS
ORAL_TABLET | ORAL | 5 refills | Status: DC
  Filled 2021-10-04: qty 30, 30d supply, fill #0
  Filled 2021-11-12: qty 30, 30d supply, fill #1
  Filled 2021-12-08: qty 30, 30d supply, fill #2
  Filled 2022-01-08: qty 30, 30d supply, fill #3
  Filled 2022-02-05: qty 30, 30d supply, fill #4
  Filled 2022-03-06: qty 30, 30d supply, fill #5

## 2021-10-10 ENCOUNTER — Telehealth (HOSPITAL_COMMUNITY): Payer: Self-pay | Admitting: *Deleted

## 2021-10-10 NOTE — Telephone Encounter (Signed)
Spoke with Markisha confirmed appointment. Will complete Health History in person tomorrow.Barnet Pall, RN,BSN ?10/10/2021 3:50 PM  ?

## 2021-10-11 ENCOUNTER — Encounter (HOSPITAL_COMMUNITY): Payer: Self-pay

## 2021-10-11 ENCOUNTER — Encounter (HOSPITAL_COMMUNITY)
Admission: RE | Admit: 2021-10-11 | Discharge: 2021-10-11 | Disposition: A | Discharge status: Home / Self Care | Payer: BC Managed Care – PPO | Source: Ambulatory Visit | Attending: Cardiology | Admitting: Cardiology

## 2021-10-11 ENCOUNTER — Other Ambulatory Visit: Payer: Self-pay

## 2021-10-11 ENCOUNTER — Ambulatory Visit (HOSPITAL_COMMUNITY)
Admission: RE | Admit: 2021-10-11 | Discharge: 2021-10-11 | Disposition: A | Discharge status: Home / Self Care | Payer: BC Managed Care – PPO | Source: Ambulatory Visit | Attending: Family Medicine | Admitting: Family Medicine

## 2021-10-11 VITALS — BP 122/68 | HR 72 | Ht 66.0 in | Wt 191.6 lb

## 2021-10-11 VITALS — BP 104/54 | HR 88 | Wt 191.0 lb

## 2021-10-11 DIAGNOSIS — I5022 Chronic systolic (congestive) heart failure: Secondary | ICD-10-CM | POA: Diagnosis not present

## 2021-10-11 DIAGNOSIS — I213 ST elevation (STEMI) myocardial infarction of unspecified site: Secondary | ICD-10-CM

## 2021-10-11 DIAGNOSIS — Z7984 Long term (current) use of oral hypoglycemic drugs: Secondary | ICD-10-CM | POA: Diagnosis not present

## 2021-10-11 DIAGNOSIS — Z955 Presence of coronary angioplasty implant and graft: Secondary | ICD-10-CM | POA: Insufficient documentation

## 2021-10-11 DIAGNOSIS — Z7982 Long term (current) use of aspirin: Secondary | ICD-10-CM | POA: Diagnosis not present

## 2021-10-11 DIAGNOSIS — R0683 Snoring: Secondary | ICD-10-CM | POA: Diagnosis not present

## 2021-10-11 DIAGNOSIS — Z6830 Body mass index (BMI) 30.0-30.9, adult: Secondary | ICD-10-CM | POA: Insufficient documentation

## 2021-10-11 DIAGNOSIS — I3139 Other pericardial effusion (noninflammatory): Secondary | ICD-10-CM | POA: Diagnosis not present

## 2021-10-11 DIAGNOSIS — I255 Ischemic cardiomyopathy: Secondary | ICD-10-CM | POA: Diagnosis not present

## 2021-10-11 DIAGNOSIS — Z7902 Long term (current) use of antithrombotics/antiplatelets: Secondary | ICD-10-CM | POA: Diagnosis not present

## 2021-10-11 DIAGNOSIS — I309 Acute pericarditis, unspecified: Secondary | ICD-10-CM | POA: Insufficient documentation

## 2021-10-11 DIAGNOSIS — Z79899 Other long term (current) drug therapy: Secondary | ICD-10-CM | POA: Insufficient documentation

## 2021-10-11 DIAGNOSIS — E669 Obesity, unspecified: Secondary | ICD-10-CM | POA: Insufficient documentation

## 2021-10-11 DIAGNOSIS — I252 Old myocardial infarction: Secondary | ICD-10-CM | POA: Diagnosis not present

## 2021-10-11 DIAGNOSIS — I2510 Atherosclerotic heart disease of native coronary artery without angina pectoris: Secondary | ICD-10-CM | POA: Insufficient documentation

## 2021-10-11 HISTORY — DX: Atherosclerotic heart disease of native coronary artery without angina pectoris: I25.10

## 2021-10-11 HISTORY — DX: Heart failure, unspecified: I50.9

## 2021-10-11 LAB — BASIC METABOLIC PANEL
Anion gap: 8 (ref 5–15)
BUN: 22 mg/dL — ABNORMAL HIGH (ref 6–20)
CO2: 24 mmol/L (ref 22–32)
Calcium: 9.1 mg/dL (ref 8.9–10.3)
Chloride: 108 mmol/L (ref 98–111)
Creatinine, Ser: 1.21 mg/dL — ABNORMAL HIGH (ref 0.44–1.00)
GFR, Estimated: 52 mL/min — ABNORMAL LOW (ref >60)
Glucose, Bld: 93 mg/dL (ref 70–99)
Potassium: 4.3 mmol/L (ref 3.5–5.1)
Sodium: 140 mmol/L (ref 135–145)

## 2021-10-11 NOTE — Progress Notes (Signed)
Cardiac Rehab Medication Review by a Nurse ? ?Does the patient  feel that his/her medications are working for him/her?  yes ? ?Has the patient been experiencing any side effects to the medications prescribed?  YES  ? ?Does the patient measure his/her own blood pressure or blood glucose at home?  yes  ? ?Does the patient have any problems obtaining medications due to transportation or finances?   no ? ?Understanding of regimen: good ?Understanding of indications: good ?Potential of compliance: good ? ? ? ?Nurse comments: Bonnie Nelson is taking her medications as prescribed and has a good understanding of what her medications as prescribed.Willisha reports having some bruising as a side effect from taking brillinta. Keerthi has a BP cuff monitor. Larrissa does not check her blood pressure on a regular basis.. ? ? ? ?[name] RN ?10/11/2021 1:45 PM ?  ?

## 2021-10-11 NOTE — Progress Notes (Signed)
ADVANCED HF CLINIC NOTE  Primary Care: Deland Pretty, MD Nephrology: Dr. Justin Mend HF Cardiologist: Dr. Aundra Dubin  HPI: Bonnie Nelson is a 56 y.o. female w/ new systolic heart failure/ischemic CM.    In 10/2020, she was admitted w/ acute pericarditis and large pericardial effusion w/ tamponade, requiring emergent pericardiocentesis. LVEF at the time was hyperdynamic, 65-70%. Pericardial fluid showed elevated protein and high normal glucose levels. It was negative for cultures and AFB stain and positive for reactive mesothelial cells. She was treated w/ high dose ASA and colchicine. Also noted to have brief occurrence of Afib w/ RVR during admission but spontaneously converted to NSR. No indication for anticoagulation (CHA2DS2VASc of 1).    Readmitted 12/22 w/ chest pain and ruled in for NSTEMI.  HS-troponin I peaked at 4263 ng. 2D echo showed newly reduced LVEF,40-45%. RV normal. No pericardial effusion.    Subsequent LHC showed severe 2 vessel CAD,95 to 99% pLAD treated w/ PCI + DES, as well as 95 to 99% LCx lesion also treated w/ PCI +DES. LFEV estimated to be lower by LVG at ~25%. Placed on DAPT w/ ASA + Brilinta, high intensity statin + GDMT for HF w/ Entresto, Jardiance and Toprol XL. Referred to [facility] clinic.    Seen in TOC, GDMT titrated and referred to [facility] to establish care for CAD with general cardiology.   Today she returns for HF follow up. Overall feeling fine. Doing well at Cardiac Rehab and continues with weight loss efforts. She does not have dyspnea with increased physical activity. Denies palpitations, CP, dizziness, edema, or PND/Orthopnea. Appetite ok. No fever or chills. Weight at home 188 pounds. Taking all medications. Hoping to retire in June.  Cardiac Testing  - Echo (12/22): EF 40-45%, mildly decreased function, mild LVH, grade I DD, severe HK of LV/mid apical septal wall and anterior wall, normal RV, mild to moderate MR, no pericardial effusion.     - LHC  (12/22): LV: 128/2, EDP 30 mmHg.  Ao 125/71, mean 96 mmHg.  No pressure gradient across the aortic valve. LVEF, global hypokinesis, anterolateral hypokinesis.  Hand contrast injection only performed due to elevated EDP. RCA: Nondominant, small and normal. LM: Nonexistent. LAD: Separate ostia.  Proximal LAD has a smooth 95 to 99% stenosis with TIMI II flow. Successful direct stenting with a 3.5 x 18 mm Onyx frontier DES, stenosis reduced to 0% with TIMI II to TIMI-3 flow improvement. LCx: Dominant.  Large-caliber vessel.  Gives origin to large OM1, small OM 2 and OM 3 is large.  Has a focal 95% to 99% stenosis.  Direct stenting with 3.5 x 16 mm Synergy XD DES, stenosis reduced to 0% with TIMI-3 to TIMI-3 flow.    Past Medical History:  Diagnosis Date   Anxiety    Arthritis    Asthma    Myocardial infarct (HCC)    Pericarditis    Pneumonia    Current Outpatient Medications  Medication Sig Dispense Refill   acetaminophen (TYLENOL) 500 MG tablet Take 1,000 mg by mouth every 6 (six) hours as needed for mild pain or moderate pain.     ALPRAZolam (XANAX) 0.25 MG tablet Take 1 tablet by mouth 3 times a day as neeed for anxiety 90 tablet 1   aspirin EC 81 MG tablet Take 81 mg by mouth every evening.     atorvastatin (LIPITOR) 80 MG tablet Take 1 tablet (80 mg total) by mouth daily. 30 tablet 6   Azelastine-Fluticasone 137-50 MCG/ACT SUSP Use 1  spray in each nostril twice a day 23 g 0   desloratadine (CLARINEX) 5 MG tablet Take 1 tablet by mouth daily (Patient taking differently: Take 5 mg by mouth every evening.) 30 tablet 5   empagliflozin (JARDIANCE) 10 MG TABS tablet Take 1 tablet (10 mg total) by mouth daily. 30 tablet 6   fexofenadine (ALLEGRA) 180 MG tablet Take 180 mg by mouth in the morning.     Fluticasone-Umeclidin-Vilant (TRELEGY ELLIPTA) 200-62.5-25 MCG/ACT AEPB Inhale 1 puff into the lungs once a day. 60 each 5   ipratropium (ATROVENT) 0.06 % nasal spray Place 2 sprays into both  nostrils daily as needed for allergies.     levalbuterol (XOPENEX HFA) 45 MCG/ACT inhaler Inhale 2 puffs into the lungs every 4-6 hours as needed for cough/wheeze. 15 g 0   metoprolol succinate (TOPROL-XL) 50 MG 24 hr tablet Take 1 tablet (50 mg total) by mouth daily. Take with or immediately following a meal. 30 tablet 6   montelukast (SINGULAIR) 10 MG tablet Take 10 mg by mouth at bedtime.      nitroGLYCERIN (NITROSTAT) 0.4 MG SL tablet Place 1 tablet (0.4 mg total) under the tongue every 5 (five) minutes x 3 doses as needed for chest pain. 25 tablet 1   pantoprazole (PROTONIX) 40 MG tablet Take 1 tablet (40 mg total) by mouth daily at 12 noon. (Patient taking differently: Take 40 mg by mouth every evening.) 30 tablet 1   sacubitril-valsartan (ENTRESTO) 24-26 MG Take 1 tablet by mouth 2 (two) times daily. 60 tablet 6   sertraline (ZOLOFT) 100 MG tablet Take 150 mg by mouth daily.     spironolactone (ALDACTONE) 25 MG tablet Take 1/2 tablet by mouth daily. 90 tablet 3   ticagrelor (BRILINTA) 90 MG TABS tablet Take 1 tablet (90 mg total) by mouth 2 (two) times daily. 60 tablet 6   No current facility-administered medications for this encounter.   No Known Allergies  Social History   Socioeconomic History   Marital status: Married    Spouse name: Rosetter Loch   Number of children: 2   Years of education: Not on file   Highest education level: Not on file  Occupational History   Occupation: middle school math teacher    Comment: [employer]  Tobacco Use   Smoking status: Never   Smokeless tobacco: Never  Vaping Use   Vaping Use: Never used  Substance and Sexual Activity   Alcohol use: Not Currently    Alcohol/week: 3.0 standard drinks    Types: 3 Glasses of wine per week    Comment: OCCASIONAL   Drug use: Never   Sexual activity: Yes  Other Topics Concern   Not on file  Social History Narrative   Not on file   Social Determinants of Health   Financial Resource  Strain: Low Risk    Difficulty of Paying Living Expenses: Not hard at all  Food Insecurity: No Food Insecurity   Worried About Programme researcher, broadcasting/film/video in the Last Year: Never true   Ran Out of Food in the Last Year: Never true  Transportation Needs: No Transportation Needs   Lack of Transportation (Medical): No   Lack of Transportation (Non-Medical): No  Physical Activity: Not on file  Stress: Not on file  Social Connections: Not on file  Intimate Partner Violence: Not on file   Family History  Problem Relation Age of Onset   Hypertension Father    BP (!) 104/54  Pulse 88    Wt 86.6 kg (191 lb)    SpO2 96%    BMI 30.83 kg/m   Wt Readings from Last 3 Encounters:  10/11/21 86.6 kg (191 lb)  10/11/21 86.9 kg (191 lb 9.3 oz)  08/30/21 89.3 kg (196 lb 12.8 oz)   PHYSICAL EXAM: General:  NAD. No resp difficulty HEENT: Normal Neck: Supple. No JVD. Carotids 2+ bilat; no bruits. No lymphadenopathy or thryomegaly appreciated. Cor: PMI nondisplaced. Regular rate & rhythm. No rubs, gallops or murmurs. Lungs: Clear Abdomen: Obese, nontender, nondistended. No hepatosplenomegaly. No bruits or masses. Good bowel sounds. Extremities: No cyanosis, clubbing, rash, edema Neuro: Alert & oriented x 3, cranial nerves grossly intact. Moves all 4 extremities w/o difficulty. Affect pleasant.  ASSESSMENT & PLAN: Chronic Systolic Heart Failure, iCM. - Ischemic CM, in setting of high grade left system CAD as outlined below, now s/p PCI to LAD and LCx - Echo (12/22): EF down to 40-45% (noted to be ~25% by LVG at time of cath, previously 65%). RV normal  - NYHA Class I. Euvolemic on exam, weight down 5 lbs.  - Continue spiro 12.5 mg daily. BMET today. - Continue Entresto 24-26 mg bid. No BP room to increase today. - Continue Jardiance 10 mg daily.  - Continue Toprol XL 50 mg daily.  - No need for loop diuretic currently. - Plan to repeat echo next visit. If EF improves/normalizes, can likely graduate  from [facility] and continue w/ care w/ cardiology.    2. CAD - Recent NSTEMI 12/22. LHC w/ severe 2VCAD, 95-99% pLAD and 95-99% LCx lesion, both lesions treated w/ PCI + DES - Stable w/o angina.  - DAPT w/ ASA + Brilinta for minimum of 12 months (~12/23). - On high intensisty statin + ? blocker. - Lipids per PCP, LDL 44 2/23. - Continue Cardiac Rehab.   3. H/o Pericardial Effusion/ Tamponade, 2/2 Acute Pericarditis  - Diagnosed 3/22, s/p emergent pericardiocentesis  - Completed tx w/ high dose ASA + 3 months of colchicine. - Echo 12/22 showed no effusion.    4. Obesity - Body mass index is 30.83 kg/m. - Continue current weight loss efforts. - With CAD, we discussed referral to pharmacy for semaglutide. She will think about this and let me know if she would like referral.  5. Snoring - No OSA on home sleep study 2/23.  She was previously referred to [facility] on [address] for follow up regarding her CAD.  Follow up in 2 months with Dr. Aundra Dubin + echo as scheduled.   Allena Katz, FNP-BC 10/11/21

## 2021-10-11 NOTE — Progress Notes (Signed)
Cardiac Individual Treatment Plan  Patient Details  Name: Bonnie Nelson MRN: [id number] Date of Birth: 01/26/1966 Referring Provider:   Flowsheet Row CARDIAC REHAB PHASE II ORIENTATION from 10/11/2021 in [facility] CARDIAC REHAB  Referring Provider Laurey Morale, MD       Initial Encounter Date:  Flowsheet Row CARDIAC REHAB PHASE II ORIENTATION from 10/11/2021 in [facility] CARDIAC REHAB  Date 10/11/21       Visit Diagnosis: 07/20/21 STEMI  07/20/21 S/P DES x 2 LAD  Patient's Home Medications on Admission:  Current Outpatient Medications:    acetaminophen (TYLENOL) 500 MG tablet, Take 1,000 mg by mouth every 6 (six) hours as needed for mild pain or moderate pain., Disp: , Rfl:    ALPRAZolam (XANAX) 0.25 MG tablet, Take 1 tablet by mouth 3 times a day as neeed for anxiety, Disp: 90 tablet, Rfl: 1   aspirin EC 81 MG tablet, Take 81 mg by mouth every evening., Disp: , Rfl:    atorvastatin (LIPITOR) 80 MG tablet, Take 1 tablet (80 mg total) by mouth daily., Disp: 30 tablet, Rfl: 6   Azelastine-Fluticasone 137-50 MCG/ACT SUSP, Use 1 spray in each nostril twice a day, Disp: 23 g, Rfl: 0   desloratadine (CLARINEX) 5 MG tablet, Take 1 tablet by mouth daily (Patient taking differently: Take 5 mg by mouth every evening.), Disp: 30 tablet, Rfl: 5   empagliflozin (JARDIANCE) 10 MG TABS tablet, Take 1 tablet (10 mg total) by mouth daily., Disp: 30 tablet, Rfl: 6   fexofenadine (ALLEGRA) 180 MG tablet, Take 180 mg by mouth in the morning., Disp: , Rfl:    Fluticasone-Umeclidin-Vilant (TRELEGY ELLIPTA) 200-62.5-25 MCG/ACT AEPB, Inhale 1 puff into the lungs once a day., Disp: 60 each, Rfl: 5   ipratropium (ATROVENT) 0.06 % nasal spray, Place 2 sprays into both nostrils daily as needed for allergies., Disp: , Rfl:    levalbuterol (XOPENEX HFA) 45 MCG/ACT inhaler, Inhale 2 puffs into the lungs every 4-6 hours as needed for cough/wheeze., Disp: 15 g, Rfl: 0    metoprolol succinate (TOPROL-XL) 50 MG 24 hr tablet, Take 1 tablet (50 mg total) by mouth daily. Take with or immediately following a meal., Disp: 30 tablet, Rfl: 6   montelukast (SINGULAIR) 10 MG tablet, Take 10 mg by mouth at bedtime. , Disp: , Rfl:    nitroGLYCERIN (NITROSTAT) 0.4 MG SL tablet, Place 1 tablet (0.4 mg total) under the tongue every 5 (five) minutes x 3 doses as needed for chest pain., Disp: 25 tablet, Rfl: 1   pantoprazole (PROTONIX) 40 MG tablet, Take 1 tablet (40 mg total) by mouth daily at 12 noon. (Patient taking differently: Take 40 mg by mouth every evening.), Disp: 30 tablet, Rfl: 1   sacubitril-valsartan (ENTRESTO) 24-26 MG, Take 1 tablet by mouth 2 (two) times daily., Disp: 60 tablet, Rfl: 6   sertraline (ZOLOFT) 100 MG tablet, Take 150 mg by mouth daily., Disp: , Rfl:    spironolactone (ALDACTONE) 25 MG tablet, Take 1/2 tablet by mouth daily., Disp: 90 tablet, Rfl: 3   ticagrelor (BRILINTA) 90 MG TABS tablet, Take 1 tablet (90 mg total) by mouth 2 (two) times daily., Disp: 60 tablet, Rfl: 6  Past Medical History: Past Medical History:  Diagnosis Date   Anxiety    Arthritis    Asthma    CHF (congestive heart failure) (HCC)    Coronary artery disease    Myocardial infarct (HCC)    Pericarditis  Pneumonia     Tobacco Use: Social History   Tobacco Use  Smoking Status Never  Smokeless Tobacco Never    Labs: Recent Review Flowsheet Data     Labs for ITP Cardiac and Pulmonary Rehab Latest Ref Rng & Units 09/14/2019 09/15/2019 07/20/2021 07/20/2021   Cholestrol 0 - 200 mg/dL - 125 165 148   LDLCALC 0 - 99 mg/dL - 62 96 97   HDL >40 mg/dL - 48 51 45   Trlycerides <150 mg/dL - 74 89 31   Hemoglobin A1c 4.8 - 5.6 % 5.6 - 5.6 -       Capillary Blood Glucose: Lab Results  Component Value Date   GLUCAP 117 (H) 07/20/2021     Exercise Target Goals: Exercise Program Goal: Individual exercise prescription set using results from initial 6 min walk test and  THRR while considering  patients activity barriers and safety.   Exercise Prescription Goal: Starting with aerobic activity 30 plus minutes a day, 3 days per week for initial exercise prescription. Provide home exercise prescription and guidelines that participant acknowledges understanding prior to discharge.  Activity Barriers & Risk Stratification:  Activity Barriers & Cardiac Risk Stratification - 10/11/21 1441       Activity Barriers & Cardiac Risk Stratification   Activity Barriers Arthritis;Other (comment)    Comments Torn meniscus x2 right knee w/ surgery August 2022. Arthritis both knees.    Cardiac Risk Stratification High             6 Minute Walk:  6 Minute Walk     Row Name 10/11/21 1356         6 Minute Walk   Phase Initial     Distance 2109 feet     Walk Time 6 minutes     # of Rest Breaks 0     MPH 3.99     METS 4.97     RPE 12     Perceived Dyspnea  0     VO2 Peak 17.41     Symptoms No     Resting HR 72 bpm     Resting BP 122/68     Resting Oxygen Saturation  97 %     Exercise Oxygen Saturation  during 6 min walk 97 %     Max Ex. HR 105 bpm     Max Ex. BP 146/56     2 Minute Post BP 138/74              Oxygen Initial Assessment:   Oxygen Re-Evaluation:   Oxygen Discharge (Final Oxygen Re-Evaluation):   Initial Exercise Prescription:  Initial Exercise Prescription - 10/11/21 1400       Date of Initial Exercise RX and Referring Provider   Date 10/11/21    Referring Provider Larey Dresser, MD    Expected Discharge Date 12/09/21      Treadmill   MPH 2.5    Grade 0    Minutes 15    METs 2.91      NuStep   Level 2    SPM 85    Minutes 15    METs 2.5      Prescription Details   Frequency (times per week) 3    Duration Progress to 30 minutes of continuous aerobic without signs/symptoms of physical distress      Intensity   THRR 40-80% of Max Heartrate 65-131    Ratings of Perceived Exertion 11-13    Perceived  Dyspnea 0-4  Progression   Progression Continue to progress workloads to maintain intensity without signs/symptoms of physical distress.      Resistance Training   Training Prescription Yes    Weight 3 lbs    Reps 10-15             Perform Capillary Blood Glucose checks as needed.  Exercise Prescription Changes:   Exercise Comments:   Exercise Goals and Review:   Exercise Goals     Row Name 10/11/21 1320             Exercise Goals   Increase Physical Activity Yes       Intervention Develop an individualized exercise prescription for aerobic and resistive training based on initial evaluation findings, risk stratification, comorbidities and participant's personal goals.;Provide advice, education, support and counseling about physical activity/exercise needs.       Expected Outcomes Short Term: Attend rehab on a regular basis to increase amount of physical activity.;Long Term: Exercising regularly at least 3-5 days a week.;Long Term: Add in home exercise to make exercise part of routine and to increase amount of physical activity.       Increase Strength and Stamina Yes       Intervention Provide advice, education, support and counseling about physical activity/exercise needs.;Develop an individualized exercise prescription for aerobic and resistive training based on initial evaluation findings, risk stratification, comorbidities and participant's personal goals.       Expected Outcomes Short Term: Increase workloads from initial exercise prescription for resistance, speed, and METs.;Short Term: Perform resistance training exercises routinely during rehab and add in resistance training at home;Long Term: Improve cardiorespiratory fitness, muscular endurance and strength as measured by increased METs and functional capacity (6MWT)       Able to understand and use rate of perceived exertion (RPE) scale Yes       Intervention Provide education and explanation on how to use RPE  scale       Expected Outcomes Short Term: Able to use RPE daily in rehab to express subjective intensity level;Long Term:  Able to use RPE to guide intensity level when exercising independently       Knowledge and understanding of Target Heart Rate Range (THRR) Yes       Intervention Provide education and explanation of THRR including how the numbers were predicted and where they are located for reference       Expected Outcomes Short Term: Able to state/look up THRR;Long Term: Able to use THRR to govern intensity when exercising independently;Short Term: Able to use daily as guideline for intensity in rehab       Able to check pulse independently Yes       Intervention Provide education and demonstration on how to check pulse in carotid and radial arteries.;Review the importance of being able to check your own pulse for safety during independent exercise       Expected Outcomes Short Term: Able to explain why pulse checking is important during independent exercise;Long Term: Able to check pulse independently and accurately       Understanding of Exercise Prescription Yes       Intervention Provide education, explanation, and written materials on patient's individual exercise prescription       Expected Outcomes Short Term: Able to explain program exercise prescription;Long Term: Able to explain home exercise prescription to exercise independently                Exercise Goals Re-Evaluation :    Discharge Exercise Prescription (  Final Exercise Prescription Changes):   Nutrition:  Target Goals: Understanding of nutrition guidelines, daily intake of sodium 1500mg , cholesterol 200mg , calories 30% from fat and 7% or less from saturated fats, daily to have 5 or more servings of fruits and vegetables.  Biometrics:  Pre Biometrics - 10/11/21 1319       Pre Biometrics   Waist Circumference 36 inches    Hip Circumference 44.5 inches    Waist to Hip Ratio 0.81 %    Triceps Skinfold 27 mm     % Body Fat 39.6 %    Grip Strength 30 kg    Flexibility 15.63 in    Single Leg Stand 30 seconds              Nutrition Therapy Plan and Nutrition Goals:   Nutrition Assessments:  MEDIFICTS Score Key: ?70 Need to make dietary changes  40-70 Heart Healthy Diet ? 40 Therapeutic Level Cholesterol Diet   Picture Your Plate Scores: D34-534 Unhealthy dietary pattern with much room for improvement. 41-50 Dietary pattern unlikely to meet recommendations for good health and room for improvement. 51-60 More healthful dietary pattern, with some room for improvement.  >60 Healthy dietary pattern, although there may be some specific behaviors that could be improved.    Nutrition Goals Re-Evaluation:   Nutrition Goals Discharge (Final Nutrition Goals Re-Evaluation):   Psychosocial: Target Goals: Acknowledge presence or absence of significant depression and/or stress, maximize coping skills, provide positive support system. Participant is able to verbalize types and ability to use techniques and skills needed for reducing stress and depression.  Initial Review & Psychosocial Screening:  Initial Psych Review & Screening - 10/11/21 1332       Initial Review   Current issues with Current Anxiety/Panic;Current Stress Concerns    Source of Stress Concerns Occupation    Comments Zanae has anxiety she takes xanax as needed. Josy denies being depressed      Family Dynamics   Good Support System? Yes   Taelar has her husband and her two daughters for support     Barriers   Psychosocial barriers to participate in program The patient should benefit from training in stress management and relaxation.      Screening Interventions   Interventions Encouraged to exercise             Quality of Life Scores:  Quality of Life - 10/11/21 1436       Quality of Life   Select Quality of Life      Quality of Life Scores   Health/Function Pre 25.5 %    Socioeconomic Pre 30 %     Psych/Spiritual Pre 26.36 %    Family Pre 22.8 %    GLOBAL Pre 26.21 %            Scores of 19 and below usually indicate a poorer quality of life in these areas.  A difference of  2-3 points is a clinically meaningful difference.  A difference of 2-3 points in the total score of the Quality of Life Index has been associated with significant improvement in overall quality of life, self-image, physical symptoms, and general health in studies assessing change in quality of life.  PHQ-9: Recent Review Flowsheet Data     Depression screen [facility] 2/9 10/11/2021   Decreased Interest 0   Down, Depressed, Hopeless 0   PHQ - 2 Score 0      Interpretation of Total Score  Total Score Depression Severity:  1-4 =  Minimal depression, 5-9 = Mild depression, 10-14 = Moderate depression, 15-19 = Moderately severe depression, 20-27 = Severe depression   Psychosocial Evaluation and Intervention:   Psychosocial Re-Evaluation:   Psychosocial Discharge (Final Psychosocial Re-Evaluation):   Vocational Rehabilitation: Provide vocational rehab assistance to qualifying candidates.   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 Participant will state understanding/return demonstration of topics presented.  Learning Barriers/Preferences:  Learning Barriers/Preferences - 10/11/21 1344       Learning Barriers/Preferences   Learning Barriers Sight   wears reading glasses   Learning Preferences Audio;Verbal Instruction             Education Topics: Hypertension, Hypertension Reduction -Define heart disease and high blood pressure. Discus how high  blood pressure affects the body and ways to reduce high blood pressure.   Exercise and Your Heart -Discuss why it is important to exercise, the FITT principles of exercise, normal and abnormal responses to exercise, and how to exercise safely.   Angina -Discuss definition of angina, causes of angina, treatment of angina, and how to decrease risk of having angina.   Cardiac Medications -Review what the following cardiac medications are used for, how they affect the body, and side effects that may occur when taking the medications.  Medications include Aspirin, Beta blockers, calcium channel blockers, ACE Inhibitors, angiotensin receptor blockers, diuretics, digoxin, and antihyperlipidemics.   Congestive Heart Failure -Discuss the definition of CHF, how to live with CHF, the signs and symptoms of CHF, and how keep track of weight and sodium intake.   Heart Disease and Intimacy -Discus the effect sexual activity has on the heart, how changes occur during intimacy as we age, and safety during sexual activity.   Smoking Cessation / COPD -Discuss different methods to quit smoking, the health benefits of quitting smoking, and the definition of COPD.   Nutrition I: Fats -Discuss the types of cholesterol, what cholesterol does to the heart, and how cholesterol levels can be controlled.   Nutrition II: Labels -Discuss the different components of food labels and how to read food label   Heart Parts/Heart Disease and PAD -Discuss the anatomy of the heart, the pathway of blood circulation through the heart, and these are affected by heart disease.   Stress I: Signs and Symptoms -Discuss the causes of stress, how stress may lead to anxiety and depression, and ways to limit stress.   Stress II: Relaxation -Discuss different types of relaxation techniques to limit stress.   Warning Signs of Stroke / TIA -Discuss definition of a stroke, what the signs and symptoms are of a stroke, and  how to identify when someone is having stroke.   Knowledge Questionnaire Score:  Knowledge Questionnaire Score - 10/11/21 1437       Knowledge Questionnaire Score   Pre Score 21/24             Core Components/Risk Factors/Patient Goals at Admission:  Personal Goals and Risk Factors at Admission - 10/11/21 1437       Core Components/Risk Factors/Patient Goals on Admission    Weight Management Yes;Obesity;Weight Loss    Intervention  Weight Management/Obesity: Establish reasonable short term and long term weight goals.;Obesity: Provide education and appropriate resources to help participant work on and attain dietary goals.    Admit Weight 191 lb 9.3 oz (86.9 kg)    Goal Weight: Short Term 187 lb (84.8 kg)    Goal Weight: Long Term 180 lb (81.6 kg)    Expected Outcomes Short Term: Continue to assess and modify interventions until short term weight is achieved;Long Term: Adherence to nutrition and physical activity/exercise program aimed toward attainment of established weight goal;Weight Loss: Understanding of general recommendations for a balanced deficit meal plan, which promotes 1-2 lb weight loss per week and includes a negative energy balance of [phone] kcal/d    Heart Failure Yes    Intervention Provide a combined exercise and nutrition program that is supplemented with education, support and counseling about heart failure. Directed toward relieving symptoms such as shortness of breath, decreased exercise tolerance, and extremity edema.    Expected Outcomes Improve functional capacity of life;Short term: Daily weights obtained and reported for increase. Utilizing diuretic protocols set by physician.;Long term: Adoption of self-care skills and reduction of barriers for early signs and symptoms recognition and intervention leading to self-care maintenance.;Short term: Attendance in program 2-3 days a week with increased exercise capacity. Reported lower sodium intake. Reported  increased fruit and vegetable intake. Reports medication compliance.    Hypertension Yes    Intervention Provide education on lifestyle modifcations including regular physical activity/exercise, weight management, moderate sodium restriction and increased consumption of fresh fruit, vegetables, and low fat dairy, alcohol moderation, and smoking cessation.;Monitor prescription use compliance.    Expected Outcomes Short Term: Continued assessment and intervention until BP is < 140/68mm HG in hypertensive participants. < 130/82mm HG in hypertensive participants with diabetes, heart failure or chronic kidney disease.;Long Term: Maintenance of blood pressure at goal levels.    Stress Yes    Intervention Offer individual and/or small group education and counseling on adjustment to heart disease, stress management and health-related lifestyle change. Teach and support self-help strategies.;Refer participants experiencing significant psychosocial distress to appropriate mental health specialists for further evaluation and treatment. When possible, include family members and significant others in education/counseling sessions.    Expected Outcomes Short Term: Participant demonstrates changes in health-related behavior, relaxation and other stress management skills, ability to obtain effective social support, and compliance with psychotropic medications if prescribed.;Long Term: Emotional wellbeing is indicated by absence of clinically significant psychosocial distress or social isolation.             Core Components/Risk Factors/Patient Goals Review:    Core Components/Risk Factors/Patient Goals at Discharge (Final Review):    ITP Comments:  ITP Comments     Row Name 10/11/21 1319           ITP Comments Medical Director- Dr. Fransico Him, MD                Comments: Kahlie attended orientation on 10/11/2021 to review rules and guidelines for program.  Completed 6 minute walk test, Intitial ITP,  and exercise prescription.  VSS. Telemetry-Sinus Rhythm.  Asymptomatic. Safety measures and social distancing in place per CDC guidelines.Harrell Gave RN BSN

## 2021-10-11 NOTE — Patient Instructions (Addendum)
Thank you for coming in today ? ?Labs were done today, if any labs are abnormal the clinic will call you ? ?Your physician recommends that you schedule a follow-up appointment in:  ?Keep follow up with Dr. Shirlee Latch ? ?At [facility], you and your health needs are our priority. As part of our continuing mission to provide you with exceptional heart care, we have created designated Provider Care Teams. These Care Teams include your primary Cardiologist (physician) and Advanced Practice Providers (APPs- Physician Assistants and Nurse Practitioners) who all work together to provide you with the care you need, when you need it.  ? ?You may see any of the following providers on your designated Care Team at your next follow up: ?Dr Arvilla Meres ?Dr Marca Ancona ?Tonye Becket, NP ?Robbie Lis, PA ?Jessica Milford,NP ?Anna Genre, PA ?Karle Plumber, PharmD ? ? ?Please be sure to bring in all your medications bottles to every appointment.  ? ?If you have any questions or concerns before your next appointment please send [place] a message through [employer] or call our office at [phone].   ? ?TO LEAVE A MESSAGE FOR THE NURSE SELECT OPTION 2, PLEASE LEAVE A MESSAGE INCLUDING: ?YOUR NAME ?DATE OF BIRTH ?CALL BACK NUMBER ?REASON FOR CALL**this is important as we prioritize the call backs ? ?YOU WILL RECEIVE A CALL BACK THE SAME DAY AS LONG AS YOU CALL BEFORE 4:00 PM ? ? ? ?

## 2021-10-17 ENCOUNTER — Other Ambulatory Visit: Payer: Self-pay

## 2021-10-17 ENCOUNTER — Encounter (HOSPITAL_COMMUNITY)
Admission: RE | Admit: 2021-10-17 | Discharge: 2021-10-17 | Disposition: A | Discharge status: Home / Self Care | Payer: BC Managed Care – PPO | Source: Ambulatory Visit | Attending: Cardiology | Admitting: Cardiology

## 2021-10-17 ENCOUNTER — Other Ambulatory Visit (HOSPITAL_COMMUNITY): Payer: Self-pay

## 2021-10-17 DIAGNOSIS — Z955 Presence of coronary angioplasty implant and graft: Secondary | ICD-10-CM | POA: Diagnosis present

## 2021-10-17 DIAGNOSIS — I213 ST elevation (STEMI) myocardial infarction of unspecified site: Secondary | ICD-10-CM | POA: Insufficient documentation

## 2021-10-17 MED ORDER — MONTELUKAST SODIUM 10 MG PO TABS
10.0000 mg | ORAL_TABLET | Freq: Every day | ORAL | 3 refills | Status: DC
  Filled 2021-10-17: qty 90, 90d supply, fill #0
  Filled 2022-01-15: qty 90, 90d supply, fill #1

## 2021-10-17 MED ORDER — AZELASTINE-FLUTICASONE 137-50 MCG/ACT NA SUSP
1.0000 | Freq: Two times a day (BID) | NASAL | 5 refills | Status: DC
  Filled 2021-10-17: qty 23, 30d supply, fill #0
  Filled 2021-12-08: qty 23, 30d supply, fill #1
  Filled 2022-01-17: qty 23, 30d supply, fill #2
  Filled 2022-03-06: qty 23, 30d supply, fill #3
  Filled 2022-04-03: qty 23, 30d supply, fill #4
  Filled 2022-05-08: qty 23, 30d supply, fill #5

## 2021-10-17 NOTE — Progress Notes (Incomplete)
Cardiac Individual Treatment Plan  Patient Details  Name: Bonnie Nelson MRN: [id number] Date of Birth: 1966/05/03 Referring Provider:   Flowsheet Row CARDIAC REHAB PHASE II ORIENTATION from 10/11/2021 in [place]  Referring Provider Larey Dresser, MD       Initial Encounter Date:  [place] PHASE II ORIENTATION from 10/11/2021 in [facility]  Date 10/11/21       Visit Diagnosis: 07/20/21 STEMI  07/20/21 S/P DES x 2 LAD  Patient's Home Medications on Admission:  Current Outpatient Medications:    acetaminophen (TYLENOL) 500 MG tablet, Take 1,000 mg by mouth every 6 (six) hours as needed for mild pain or moderate pain., Disp: , Rfl:    ALPRAZolam (XANAX) 0.25 MG tablet, Take 1 tablet by mouth 3 times a day as neeed for anxiety, Disp: 90 tablet, Rfl: 1   aspirin EC 81 MG tablet, Take 81 mg by mouth every evening., Disp: , Rfl:    atorvastatin (LIPITOR) 80 MG tablet, Take 1 tablet (80 mg total) by mouth daily., Disp: 30 tablet, Rfl: 6   Azelastine-Fluticasone 137-50 MCG/ACT SUSP, Use 1 spray in each nostril twice a day, Disp: 23 g, Rfl: 5   desloratadine (CLARINEX) 5 MG tablet, Take 1 tablet by mouth daily (Patient taking differently: Take 5 mg by mouth every evening.), Disp: 30 tablet, Rfl: 5   empagliflozin (JARDIANCE) 10 MG TABS tablet, Take 1 tablet (10 mg total) by mouth daily., Disp: 30 tablet, Rfl: 6   fexofenadine (ALLEGRA) 180 MG tablet, Take 180 mg by mouth in the morning., Disp: , Rfl:    Fluticasone-Umeclidin-Vilant (TRELEGY ELLIPTA) 200-62.5-25 MCG/ACT AEPB, Inhale 1 puff into the lungs once a day., Disp: 60 each, Rfl: 5   ipratropium (ATROVENT) 0.06 % nasal spray, Place 2 sprays into both nostrils daily as needed for allergies., Disp: , Rfl:    levalbuterol (XOPENEX HFA) 45 MCG/ACT inhaler, Inhale 2 puffs into the lungs every 4-6 hours as needed for cough/wheeze., Disp: 15 g, Rfl: 0    metoprolol succinate (TOPROL-XL) 50 MG 24 hr tablet, Take 1 tablet (50 mg total) by mouth daily. Take with or immediately following a meal., Disp: 30 tablet, Rfl: 6   montelukast (SINGULAIR) 10 MG tablet, Take 10 mg by mouth at bedtime. , Disp: , Rfl:    montelukast (SINGULAIR) 10 MG tablet, Take 1 tablet (10 mg total) by mouth daily., Disp: 90 tablet, Rfl: 3   nitroGLYCERIN (NITROSTAT) 0.4 MG SL tablet, Place 1 tablet (0.4 mg total) under the tongue every 5 (five) minutes x 3 doses as needed for chest pain., Disp: 25 tablet, Rfl: 1   pantoprazole (PROTONIX) 40 MG tablet, Take 1 tablet (40 mg total) by mouth daily at 12 noon. (Patient taking differently: Take 40 mg by mouth every evening.), Disp: 30 tablet, Rfl: 1   sacubitril-valsartan (ENTRESTO) 24-26 MG, Take 1 tablet by mouth 2 (two) times daily., Disp: 60 tablet, Rfl: 6   sertraline (ZOLOFT) 100 MG tablet, Take 150 mg by mouth daily., Disp: , Rfl:    spironolactone (ALDACTONE) 25 MG tablet, Take 1/2 tablet by mouth daily., Disp: 90 tablet, Rfl: 3   ticagrelor (BRILINTA) 90 MG TABS tablet, Take 1 tablet (90 mg total) by mouth 2 (two) times daily., Disp: 60 tablet, Rfl: 6  Past Medical History: Past Medical History:  Diagnosis Date   Anxiety    Arthritis    Asthma    CHF (congestive  heart failure) (HCC)    Coronary artery disease    Myocardial infarct (HCC)    Pericarditis    Pneumonia     Tobacco Use: Social History   Tobacco Use  Smoking Status Never  Smokeless Tobacco Never    Labs: Recent Review Flowsheet Data     Labs for ITP Cardiac and Pulmonary Rehab Latest Ref Rng & Units 09/14/2019 09/15/2019 07/20/2021 07/20/2021   Cholestrol 0 - 200 mg/dL - 125 165 148   LDLCALC 0 - 99 mg/dL - 62 96 97   HDL >40 mg/dL - 48 51 45   Trlycerides <150 mg/dL - 74 89 31   Hemoglobin A1c 4.8 - 5.6 % 5.6 - 5.6 -       Capillary Blood Glucose: Lab Results  Component Value Date   GLUCAP 117 (H) 07/20/2021     Exercise Target  Goals: Exercise Program Goal: Individual exercise prescription set using results from initial 6 min walk test and THRR while considering  patients activity barriers and safety.   Exercise Prescription Goal: Starting with aerobic activity 30 plus minutes a day, 3 days per week for initial exercise prescription. Provide home exercise prescription and guidelines that participant acknowledges understanding prior to discharge.  Activity Barriers & Risk Stratification:  Activity Barriers & Cardiac Risk Stratification - 10/11/21 1441       Activity Barriers & Cardiac Risk Stratification   Activity Barriers Arthritis;Other (comment)    Comments Torn meniscus x2 right knee w/ surgery August 2022. Arthritis both knees.    Cardiac Risk Stratification High             6 Minute Walk:  6 Minute Walk     Row Name 10/11/21 1356         6 Minute Walk   Phase Initial     Distance 2109 feet     Walk Time 6 minutes     # of Rest Breaks 0     MPH 3.99     METS 4.97     RPE 12     Perceived Dyspnea  0     VO2 Peak 17.41     Symptoms No     Resting HR 72 bpm     Resting BP 122/68     Resting Oxygen Saturation  97 %     Exercise Oxygen Saturation  during 6 min walk 97 %     Max Ex. HR 105 bpm     Max Ex. BP 146/56     2 Minute Post BP 138/74              Oxygen Initial Assessment:   Oxygen Re-Evaluation:   Oxygen Discharge (Final Oxygen Re-Evaluation):   Initial Exercise Prescription:  Initial Exercise Prescription - 10/11/21 1400       Date of Initial Exercise RX and Referring Provider   Date 10/11/21    Referring Provider Larey Dresser, MD    Expected Discharge Date 12/09/21      Treadmill   MPH 2.5    Grade 0    Minutes 15    METs 2.91      NuStep   Level 2    SPM 85    Minutes 15    METs 2.5      Prescription Details   Frequency (times per week) 3    Duration Progress to 30 minutes of continuous aerobic without signs/symptoms of physical distress       Intensity  THRR 40-80% of Max Heartrate 65-131    Ratings of Perceived Exertion 11-13    Perceived Dyspnea 0-4      Progression   Progression Continue to progress workloads to maintain intensity without signs/symptoms of physical distress.      Resistance Training   Training Prescription Yes    Weight 3 lbs    Reps 10-15             Perform Capillary Blood Glucose checks as needed.  Exercise Prescription Changes:   Exercise Prescription Changes     Row Name 10/17/21 1600             Response to Exercise   Blood Pressure (Admit) 108/78       Blood Pressure (Exercise) 118/68       Blood Pressure (Exit) 102/60       Heart Rate (Admit) 83 bpm       Heart Rate (Exercise) 114 bpm       Heart Rate (Exit) 78 bpm       Rating of Perceived Exertion (Exercise) 11       Perceived Dyspnea (Exercise) 0       Symptoms 0       Comments Pt first day in the CRP2 program       Duration Progress to 30 minutes of  aerobic without signs/symptoms of physical distress       Intensity THRR unchanged         Progression   Progression Continue to progress workloads to maintain intensity without signs/symptoms of physical distress.       Average METs 2.67         Resistance Training   Training Prescription Yes       Weight 3 lbs       Reps 10-15         Treadmill   MPH 2.6       Minutes 15       METs 2.91         NuStep   Level 2       SPM 85       Minutes 15       METs 2.4                Exercise Comments:   Exercise Comments     Row Name 10/17/21 1639           Exercise Comments Pt first day in the CRP2 program. Pt tolerated exercise very well and states she is happy to be back in exercise. Average MET level was 2.67. Pt is learning her THRR, RPE and ExRx.                Exercise Goals and Review:   Exercise Goals     Row Name 10/11/21 1320             Exercise Goals   Increase Physical Activity Yes       Intervention Develop an  individualized exercise prescription for aerobic and resistive training based on initial evaluation findings, risk stratification, comorbidities and participant's personal goals.;Provide advice, education, support and counseling about physical activity/exercise needs.       Expected Outcomes Short Term: Attend rehab on a regular basis to increase amount of physical activity.;Long Term: Exercising regularly at least 3-5 days a week.;Long Term: Add in home exercise to make exercise part of routine and to increase amount of physical activity.       Increase Strength  and Stamina Yes       Intervention Provide advice, education, support and counseling about physical activity/exercise needs.;Develop an individualized exercise prescription for aerobic and resistive training based on initial evaluation findings, risk stratification, comorbidities and participant's personal goals.       Expected Outcomes Short Term: Increase workloads from initial exercise prescription for resistance, speed, and METs.;Short Term: Perform resistance training exercises routinely during rehab and add in resistance training at home;Long Term: Improve cardiorespiratory fitness, muscular endurance and strength as measured by increased METs and functional capacity (6MWT)       Able to understand and use rate of perceived exertion (RPE) scale Yes       Intervention Provide education and explanation on how to use RPE scale       Expected Outcomes Short Term: Able to use RPE daily in rehab to express subjective intensity level;Long Term:  Able to use RPE to guide intensity level when exercising independently       Knowledge and understanding of Target Heart Rate Range (THRR) Yes       Intervention Provide education and explanation of THRR including how the numbers were predicted and where they are located for reference       Expected Outcomes Short Term: Able to state/look up THRR;Long Term: Able to use THRR to govern intensity when  exercising independently;Short Term: Able to use daily as guideline for intensity in rehab       Able to check pulse independently Yes       Intervention Provide education and demonstration on how to check pulse in carotid and radial arteries.;Review the importance of being able to check your own pulse for safety during independent exercise       Expected Outcomes Short Term: Able to explain why pulse checking is important during independent exercise;Long Term: Able to check pulse independently and accurately       Understanding of Exercise Prescription Yes       Intervention Provide education, explanation, and written materials on patient's individual exercise prescription       Expected Outcomes Short Term: Able to explain program exercise prescription;Long Term: Able to explain home exercise prescription to exercise independently                Exercise Goals Re-Evaluation :  Exercise Goals Re-Evaluation     Row Name 10/17/21 1642             Exercise Goal Re-Evaluation   Exercise Goals Review Increase Physical Activity;Increase Strength and Stamina;Able to understand and use rate of perceived exertion (RPE) scale;Knowledge and understanding of Target Heart Rate Range (THRR);Understanding of Exercise Prescription       Comments Pt first day in the CRP2 program. Pt tolerated exercise very well and states she is happy to be back in exercise. Average MET level was 2.67. Pt is learning her THRR, RPE and ExRx.       Expected Outcomes Will continue to monitor pt and progress workloads as tolerated without sign or symptom                 Discharge Exercise Prescription (Final Exercise Prescription Changes):  Exercise Prescription Changes - 10/17/21 1600       Response to Exercise   Blood Pressure (Admit) 108/78    Blood Pressure (Exercise) 118/68    Blood Pressure (Exit) 102/60    Heart Rate (Admit) 83 bpm    Heart Rate (Exercise) 114 bpm    Heart Rate (Exit) 78  bpm     Rating of Perceived Exertion (Exercise) 11    Perceived Dyspnea (Exercise) 0    Symptoms 0    Comments Pt first day in the CRP2 program    Duration Progress to 30 minutes of  aerobic without signs/symptoms of physical distress    Intensity THRR unchanged      Progression   Progression Continue to progress workloads to maintain intensity without signs/symptoms of physical distress.    Average METs 2.67      Resistance Training   Training Prescription Yes    Weight 3 lbs    Reps 10-15      Treadmill   MPH 2.6    Minutes 15    METs 2.91      NuStep   Level 2    SPM 85    Minutes 15    METs 2.4             Nutrition:  Target Goals: Understanding of nutrition guidelines, daily intake of sodium <1568m, cholesterol <2076m calories 30% from fat and 7% or less from saturated fats, daily to have 5 or more servings of fruits and vegetables.  Biometrics:  Pre Biometrics - 10/11/21 1319       Pre Biometrics   Waist Circumference 36 inches    Hip Circumference 44.5 inches    Waist to Hip Ratio 0.81 %    Triceps Skinfold 27 mm    % Body Fat 39.6 %    Grip Strength 30 kg    Flexibility 15.63 in    Single Leg Stand 30 seconds              Nutrition Therapy Plan and Nutrition Goals:   Nutrition Assessments:  MEDIFICTS Score Key: ?70 Need to make dietary changes  40-70 Heart Healthy Diet ? 40 Therapeutic Level Cholesterol Diet   Picture Your Plate Scores: <4<62nhealthy dietary pattern with much room for improvement. 41-50 Dietary pattern unlikely to meet recommendations for good health and room for improvement. 51-60 More healthful dietary pattern, with some room for improvement.  >60 Healthy dietary pattern, although there may be some specific behaviors that could be improved.    Nutrition Goals Re-Evaluation:   Nutrition Goals Discharge (Final Nutrition Goals Re-Evaluation):   Psychosocial: Target Goals: Acknowledge presence or absence of significant  depression and/or stress, maximize coping skills, provide positive support system. Participant is able to verbalize types and ability to use techniques and skills needed for reducing stress and depression.  Initial Review & Psychosocial Screening:  Initial Psych Review & Screening - 10/11/21 1332       Initial Review   Current issues with Current Anxiety/Panic;Current Stress Concerns    Source of Stress Concerns Occupation    Comments Raley has anxiety she takes xanax as needed. Eliyana denies being depressed      Family Dynamics   Good Support System? Yes   Sherley has her husband and her two daughters for support     Barriers   Psychosocial barriers to participate in program The patient should benefit from training in stress management and relaxation.      Screening Interventions   Interventions Encouraged to exercise             Quality of Life Scores:  Quality of Life - 10/11/21 1436       Quality of Life   Select Quality of Life      Quality of Life Scores   Health/Function Pre 25.5 %  Socioeconomic Pre 30 %    Psych/Spiritual Pre 26.36 %    Family Pre 22.8 %    GLOBAL Pre 26.21 %            Scores of 19 and below usually indicate a poorer quality of life in these areas.  A difference of  2-3 points is a clinically meaningful difference.  A difference of 2-3 points in the total score of the Quality of Life Index has been associated with significant improvement in overall quality of life, self-image, physical symptoms, and general health in studies assessing change in quality of life.  PHQ-9: Recent Review Flowsheet Data     Depression screen [facility] 2/9 10/11/2021   Decreased Interest 0   Down, Depressed, Hopeless 0   PHQ - 2 Score 0      Interpretation of Total Score  Total Score Depression Severity:  1-4 = Minimal depression, 5-9 = Mild depression, 10-14 = Moderate depression, 15-19 = Moderately severe depression, 20-27 = Severe depression   Psychosocial  Evaluation and Intervention:   Psychosocial Re-Evaluation:  Psychosocial Re-Evaluation     [place] Name 10/17/21 1657             Psychosocial Re-Evaluation   Current issues with Current Stress Concerns;Current Anxiety/Panic       Comments Lamaria started cardiac rehab on 10/17/21. Korbin did not voice any increased anxiety or stressors on her first day of exercise       Expected Outcomes Ayleen's anxiety and stress will be controlled upon completion of phase 2 cardiac rehab       Interventions Encouraged to attend Cardiac Rehabilitation for the exercise       Continue Psychosocial Services  No Follow up required         Initial Review   Source of Stress Concerns Occupation       Comments Will continue to monitor and offer support as needed                Psychosocial Discharge (Final Psychosocial Re-Evaluation):  Psychosocial Re-Evaluation - 10/17/21 1657       Psychosocial Re-Evaluation   Current issues with Current Stress Concerns;Current Anxiety/Panic    Comments Dauna started cardiac rehab on 10/17/21. Lorain did not voice any increased anxiety or stressors on her first day of exercise    Expected Outcomes Naya's anxiety and stress will be controlled upon completion of phase 2 cardiac rehab    Interventions Encouraged to attend Cardiac Rehabilitation for the exercise    Continue Psychosocial Services  No Follow up required      Initial Review   Source of Stress Concerns Occupation    Comments Will continue to monitor and offer support as needed             Vocational Rehabilitation: Provide vocational rehab assistance to qualifying candidates.   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 Participant will state understanding/return demonstration of topics presented.  Learning Barriers/Preferences:  Learning Barriers/Preferences - 10/11/21 1344       Learning Barriers/Preferences   Learning Barriers Sight   wears reading  glasses   Learning Preferences Audio;Verbal Instruction             Education Topics: Hypertension, Hypertension Reduction -Define heart disease and high blood pressure. Discus how high blood pressure affects the body and ways to reduce high blood pressure.   Exercise and Your Heart -Discuss why it is important to exercise, the FITT principles of exercise, normal and abnormal responses to exercise, and how to exercise safely.   Angina -Discuss definition of angina, causes of angina, treatment of angina, and how to decrease risk of having angina.   Cardiac Medications -Review what the following cardiac medications are used for, how they affect the body, and side effects that may occur when taking the medications.  Medications include Aspirin, Beta blockers, calcium channel blockers, ACE Inhibitors, angiotensin receptor blockers, diuretics, digoxin, and antihyperlipidemics.   Congestive Heart Failure -Discuss the definition of CHF, how to live with CHF, the signs and symptoms of CHF, and how keep track of weight and sodium intake.   Heart Disease and Intimacy -Discus the effect sexual activity has on the heart, how changes occur during intimacy as we age, and safety during sexual activity.   Smoking Cessation / COPD -Discuss different methods to quit smoking, the health benefits of quitting smoking, and the definition of COPD.   Nutrition I: Fats -Discuss the types of cholesterol, what cholesterol does to the heart, and how cholesterol levels can be controlled.   Nutrition II: Labels -Discuss the different components of food labels and how to read food label   Heart  Parts/Heart Disease and PAD -Discuss the anatomy of the heart, the pathway of blood circulation through the heart, and these are affected by heart disease.   Stress I: Signs and Symptoms -Discuss the causes of stress, how stress may lead to anxiety and depression, and ways to limit stress.   Stress II: Relaxation -Discuss different types of relaxation techniques to limit stress.   Warning Signs of Stroke / TIA -Discuss definition of a stroke, what the signs and symptoms are of a stroke, and how to identify when someone is having stroke.   Knowledge Questionnaire Score:  Knowledge Questionnaire Score - 10/11/21 1437       Knowledge Questionnaire Score   Pre Score 21/24             Core Components/Risk Factors/Patient Goals at Admission:  Personal Goals and Risk Factors at Admission - 10/11/21 1437       Core Components/Risk Factors/Patient Goals on Admission    Weight Management Yes;Obesity;Weight Loss    Intervention Weight Management/Obesity: Establish reasonable short term and long term weight goals.;Obesity: Provide education and appropriate resources to help participant work on and attain dietary goals.    Admit Weight 191 lb 9.3 oz (86.9 kg)    Goal Weight: Short Term 187 lb (84.8 kg)    Goal Weight: Long Term 180 lb (81.6 kg)    Expected Outcomes Short Term: Continue to assess and modify interventions until short term weight is achieved;Long Term: Adherence to nutrition and physical activity/exercise program aimed toward attainment of established weight goal;Weight Loss: Understanding of general recommendations for a balanced deficit meal plan, which promotes 1-2 lb weight loss per week and includes a negative energy balance of [phone] kcal/d    Heart Failure Yes    Intervention Provide a combined exercise and nutrition program that is supplemented with education, support and counseling about heart failure. Directed toward relieving symptoms such as shortness of  breath, decreased  exercise tolerance, and extremity edema.    Expected Outcomes Improve functional capacity of life;Short term: Daily weights obtained and reported for increase. Utilizing diuretic protocols set by physician.;Long term: Adoption of self-care skills and reduction of barriers for early signs and symptoms recognition and intervention leading to self-care maintenance.;Short term: Attendance in program 2-3 days a week with increased exercise capacity. Reported lower sodium intake. Reported increased fruit and vegetable intake. Reports medication compliance.    Hypertension Yes    Intervention Provide education on lifestyle modifcations including regular physical activity/exercise, weight management, moderate sodium restriction and increased consumption of fresh fruit, vegetables, and low fat dairy, alcohol moderation, and smoking cessation.;Monitor prescription use compliance.    Expected Outcomes Short Term: Continued assessment and intervention until BP is < 140/28m HG in hypertensive participants. < 130/848mHG in hypertensive participants with diabetes, heart failure or chronic kidney disease.;Long Term: Maintenance of blood pressure at goal levels.    Stress Yes    Intervention Offer individual and/or small group education and counseling on adjustment to heart disease, stress management and health-related lifestyle change. Teach and support self-help strategies.;Refer participants experiencing significant psychosocial distress to appropriate mental health specialists for further evaluation and treatment. When possible, include family members and significant others in education/counseling sessions.    Expected Outcomes Short Term: Participant demonstrates changes in health-related behavior, relaxation and other stress management skills, ability to obtain effective social support, and compliance with psychotropic medications if prescribed.;Long Term: Emotional wellbeing is indicated by absence  of clinically significant psychosocial distress or social isolation.             Core Components/Risk Factors/Patient Goals Review:   Goals and Risk Factor Review     Row Name 10/17/21 1700             Core Components/Risk Factors/Patient Goals Review   Personal Goals Review Weight Management/Obesity;Heart Failure;Hypertension;Stress;Lipids       Review Mirra started cardiac rehab on 10/17/21. Juliana did well with exercise. Vital sign were stable       Expected Outcomes Lorriann will continue to participate in phase 2 cardiac rehab for exercise, nutrition and lifestyle modificatoins                Core Components/Risk Factors/Patient Goals at Discharge (Final Review):   Goals and Risk Factor Review - 10/17/21 1700       Core Components/Risk Factors/Patient Goals Review   Personal Goals Review Weight Management/Obesity;Heart Failure;Hypertension;Stress;Lipids    Review Liel started cardiac rehab on 10/17/21. Kaegan did well with exercise. Vital sign were stable    Expected Outcomes Elfreda will continue to participate in phase 2 cardiac rehab for exercise, nutrition and lifestyle modificatoins             ITP Comments:  ITP Comments     Row Name 10/11/21 1319 10/17/21 1656         ITP Comments Medical Director- Dr. TrFransico Him[facility] 30 day ITP Review. Tichina started cardiac rehab on 10/17/21. Cieara did well with exercise               Comments: See ITP comments.MaBarnet PallRN,BSN 10/17/2021 5:05 PM

## 2021-10-17 NOTE — Progress Notes (Signed)
Daily Session Note ? ?Patient Details  ?Name: Bonnie Nelson ?MRN: [id number] ?Date of Birth: Feb 16, 1966 ?Referring Provider:   ?Flowsheet Row CARDIAC REHAB PHASE II ORIENTATION from 10/11/2021 in [place]  ?Referring Provider Larey Dresser, MD  ? ?  ? ? ?Encounter Date: 10/17/2021 ? ?Check In: ? Session Check In - 10/17/21 1507   ? ?  ? Check-In  ? Supervising physician immediately available to respond to emergencies [facility] immediately available   ? Physician(s) Dr. Alfredia Ferguson   ? Location [facility]   ? Staff Present Esmeralda Links BS, ACSM EP-C, Exercise Physiologist;Winifred Bodiford, RN, Deland Pretty, MS, ACSM CEP, Exercise Physiologist;David Makemson, MS, ACSM-CEP, CCRP, Exercise Physiologist   ? Virtual Visit No   ? Medication changes reported     No   ? Fall or balance concerns reported    No   ? Tobacco Cessation No Change   ? Warm-up and Cool-down Performed as group-led instruction   ? Resistance Training Performed Yes   ? VAD Patient? No   ? PAD/SET Patient? No   ?  ? Pain Assessment  ? Currently in Pain? No/denies   ? Pain Score 0-No pain   ? Multiple Pain Sites No   ? ?  ?  ? ?  ? ? ?Capillary Blood Glucose: ?No results found for this or any previous visit (from the past 24 hour(s)). ? ? Exercise Prescription Changes - 10/17/21 1600   ? ?  ? Response to Exercise  ? Blood Pressure (Admit) 108/78   ? Blood Pressure (Exercise) 118/68   ? Blood Pressure (Exit) 102/60   ? Heart Rate (Admit) 83 bpm   ? Heart Rate (Exercise) 114 bpm   ? Heart Rate (Exit) 78 bpm   ? Rating of Perceived Exertion (Exercise) 11   ? Perceived Dyspnea (Exercise) 0   ? Symptoms 0   ? Comments Pt first day in the CRP2 program   ? Duration Progress to 30 minutes of  aerobic without signs/symptoms of physical distress   ? Intensity THRR unchanged   ?  ? Progression  ? Progression Continue to progress workloads to maintain intensity without signs/symptoms of physical distress.   ?  Average METs 2.67   ?  ? Resistance Training  ? Training Prescription Yes   ? Weight 3 lbs   ? Reps 10-15   ?  ? Treadmill  ? MPH 2.6   ? Minutes 15   ? METs 2.91   ?  ? NuStep  ? Level 2   ? SPM 85   ? Minutes 15   ? METs 2.4   ? ?  ?  ? ?  ? ? ?Social History  ? ?Tobacco Use  ?Smoking Status Never  ?Smokeless Tobacco Never  ? ? ?Goals Met:  ?Exercise tolerated well ?No report of concerns or symptoms today ?Strength training completed today ? ?Goals Unmet:  ?Not Applicable ? ?Comments: Pt started cardiac rehab today.  Pt tolerated light exercise without difficulty. VSS, telemetry-Sinus Rhythm, asymptomatic.  Medication list reconciled. Pt denies barriers to medicaiton compliance.  PSYCHOSOCIAL ASSESSMENT:  PHQ-0. Pt exhibits positive coping skills, hopeful outlook with supportive family. No psychosocial needs identified at this time, no psychosocial interventions necessary.    Pt enjoys gardening, volunteering and working at [employer].   Pt oriented to exercise equipment and routine.    Understanding verbalized. Harrell Gave RN BSN  ? ? ?Dr. Fransico Him is  Medical Director for Cardiac Rehab at [facility]. ?

## 2021-10-18 ENCOUNTER — Other Ambulatory Visit (HOSPITAL_COMMUNITY): Payer: Self-pay

## 2021-10-19 ENCOUNTER — Other Ambulatory Visit: Payer: Self-pay

## 2021-10-19 ENCOUNTER — Other Ambulatory Visit (HOSPITAL_COMMUNITY): Payer: Self-pay | Admitting: Cardiology

## 2021-10-19 ENCOUNTER — Encounter (HOSPITAL_COMMUNITY)
Admission: RE | Admit: 2021-10-19 | Discharge: 2021-10-19 | Disposition: A | Discharge status: Home / Self Care | Payer: BC Managed Care – PPO | Source: Ambulatory Visit | Attending: Cardiology | Admitting: Cardiology

## 2021-10-19 DIAGNOSIS — I213 ST elevation (STEMI) myocardial infarction of unspecified site: Secondary | ICD-10-CM

## 2021-10-19 DIAGNOSIS — Z955 Presence of coronary angioplasty implant and graft: Secondary | ICD-10-CM

## 2021-10-21 ENCOUNTER — Encounter (HOSPITAL_COMMUNITY)
Admission: RE | Admit: 2021-10-21 | Discharge: 2021-10-21 | Disposition: A | Discharge status: Home / Self Care | Payer: BC Managed Care – PPO | Source: Ambulatory Visit | Attending: Cardiology | Admitting: Cardiology

## 2021-10-21 ENCOUNTER — Other Ambulatory Visit: Payer: Self-pay

## 2021-10-21 DIAGNOSIS — I213 ST elevation (STEMI) myocardial infarction of unspecified site: Secondary | ICD-10-CM | POA: Diagnosis not present

## 2021-10-21 DIAGNOSIS — Z955 Presence of coronary angioplasty implant and graft: Secondary | ICD-10-CM

## 2021-10-24 ENCOUNTER — Other Ambulatory Visit: Payer: Self-pay

## 2021-10-24 ENCOUNTER — Other Ambulatory Visit (HOSPITAL_COMMUNITY): Payer: Self-pay

## 2021-10-24 ENCOUNTER — Encounter (HOSPITAL_COMMUNITY)
Admission: RE | Admit: 2021-10-24 | Discharge: 2021-10-24 | Disposition: A | Discharge status: Home / Self Care | Payer: BC Managed Care – PPO | Source: Ambulatory Visit | Attending: Cardiology | Admitting: Cardiology

## 2021-10-24 DIAGNOSIS — I213 ST elevation (STEMI) myocardial infarction of unspecified site: Secondary | ICD-10-CM | POA: Diagnosis not present

## 2021-10-24 DIAGNOSIS — Z955 Presence of coronary angioplasty implant and graft: Secondary | ICD-10-CM

## 2021-10-24 MED ORDER — SERTRALINE HCL 100 MG PO TABS
ORAL_TABLET | ORAL | 1 refills | Status: DC
  Filled 2021-10-24: qty 135, 90d supply, fill #0
  Filled 2022-01-15: qty 135, 90d supply, fill #1

## 2021-10-26 ENCOUNTER — Encounter (HOSPITAL_COMMUNITY)
Admission: RE | Admit: 2021-10-26 | Discharge: 2021-10-26 | Disposition: A | Discharge status: Home / Self Care | Payer: BC Managed Care – PPO | Source: Ambulatory Visit | Attending: Cardiology | Admitting: Cardiology

## 2021-10-26 ENCOUNTER — Other Ambulatory Visit: Payer: Self-pay

## 2021-10-26 DIAGNOSIS — Z955 Presence of coronary angioplasty implant and graft: Secondary | ICD-10-CM

## 2021-10-26 DIAGNOSIS — I213 ST elevation (STEMI) myocardial infarction of unspecified site: Secondary | ICD-10-CM

## 2021-10-28 ENCOUNTER — Other Ambulatory Visit: Payer: Self-pay

## 2021-10-28 ENCOUNTER — Encounter (HOSPITAL_COMMUNITY)
Admission: RE | Admit: 2021-10-28 | Discharge: 2021-10-28 | Disposition: A | Discharge status: Home / Self Care | Payer: BC Managed Care – PPO | Source: Ambulatory Visit | Attending: Cardiology | Admitting: Cardiology

## 2021-10-28 DIAGNOSIS — Z955 Presence of coronary angioplasty implant and graft: Secondary | ICD-10-CM

## 2021-10-28 DIAGNOSIS — I213 ST elevation (STEMI) myocardial infarction of unspecified site: Secondary | ICD-10-CM

## 2021-10-28 NOTE — Progress Notes (Signed)
CARDIAC REHAB PHASE 2 ? ?Reviewed home exercise with pt today. Pt is tolerating exercise well. Pt will continue to exercise on her own by waking and going to the [facility] for 30-45 minutes per session 2-4 days a week in addition to the 3 days in CRP2. Advised pt on THRR, RPE scale, hydration and temperature/humidity precautions. Reinforced NTG use, S/S to stop exercise and when to call MD vs 911. Encouraged warm up cool down and stretches with exercise sessions. Pt verbalized understanding, all questions were answered and pt was given a copy to take home.  ?  ?Harrie Jeans ACSM-CEP ?10/28/2021 ?4:39 PM ? ?

## 2021-10-31 ENCOUNTER — Encounter (HOSPITAL_COMMUNITY)
Admission: RE | Admit: 2021-10-31 | Discharge: 2021-10-31 | Disposition: A | Discharge status: Home / Self Care | Payer: BC Managed Care – PPO | Source: Ambulatory Visit | Attending: Cardiology | Admitting: Cardiology

## 2021-10-31 ENCOUNTER — Other Ambulatory Visit: Payer: Self-pay

## 2021-10-31 DIAGNOSIS — I213 ST elevation (STEMI) myocardial infarction of unspecified site: Secondary | ICD-10-CM

## 2021-10-31 DIAGNOSIS — Z955 Presence of coronary angioplasty implant and graft: Secondary | ICD-10-CM

## 2021-11-02 ENCOUNTER — Encounter (HOSPITAL_COMMUNITY)
Admission: RE | Admit: 2021-11-02 | Discharge: 2021-11-02 | Disposition: A | Discharge status: Home / Self Care | Payer: BC Managed Care – PPO | Source: Ambulatory Visit | Attending: Cardiology | Admitting: Cardiology

## 2021-11-02 ENCOUNTER — Other Ambulatory Visit (HOSPITAL_COMMUNITY): Payer: Self-pay

## 2021-11-02 DIAGNOSIS — I213 ST elevation (STEMI) myocardial infarction of unspecified site: Secondary | ICD-10-CM

## 2021-11-02 DIAGNOSIS — Z955 Presence of coronary angioplasty implant and graft: Secondary | ICD-10-CM

## 2021-11-04 ENCOUNTER — Encounter (HOSPITAL_COMMUNITY)
Admission: RE | Admit: 2021-11-04 | Discharge: 2021-11-04 | Disposition: A | Discharge status: Home / Self Care | Payer: BC Managed Care – PPO | Source: Ambulatory Visit | Attending: Cardiology | Admitting: Cardiology

## 2021-11-04 DIAGNOSIS — Z955 Presence of coronary angioplasty implant and graft: Secondary | ICD-10-CM

## 2021-11-04 DIAGNOSIS — I213 ST elevation (STEMI) myocardial infarction of unspecified site: Secondary | ICD-10-CM

## 2021-11-07 ENCOUNTER — Encounter (HOSPITAL_COMMUNITY)
Admission: RE | Admit: 2021-11-07 | Discharge: 2021-11-07 | Disposition: A | Discharge status: Home / Self Care | Payer: BC Managed Care – PPO | Source: Ambulatory Visit | Attending: Cardiology | Admitting: Cardiology

## 2021-11-07 DIAGNOSIS — I213 ST elevation (STEMI) myocardial infarction of unspecified site: Secondary | ICD-10-CM

## 2021-11-07 DIAGNOSIS — Z48812 Encounter for surgical aftercare following surgery on the circulatory system: Secondary | ICD-10-CM | POA: Diagnosis not present

## 2021-11-07 DIAGNOSIS — Z955 Presence of coronary angioplasty implant and graft: Secondary | ICD-10-CM | POA: Diagnosis present

## 2021-11-07 DIAGNOSIS — I252 Old myocardial infarction: Secondary | ICD-10-CM | POA: Diagnosis not present

## 2021-11-09 ENCOUNTER — Encounter (HOSPITAL_COMMUNITY)
Admission: RE | Admit: 2021-11-09 | Discharge: 2021-11-09 | Disposition: A | Discharge status: Home / Self Care | Payer: BC Managed Care – PPO | Source: Ambulatory Visit | Attending: Cardiology | Admitting: Cardiology

## 2021-11-09 DIAGNOSIS — Z48812 Encounter for surgical aftercare following surgery on the circulatory system: Secondary | ICD-10-CM | POA: Diagnosis not present

## 2021-11-09 DIAGNOSIS — I213 ST elevation (STEMI) myocardial infarction of unspecified site: Secondary | ICD-10-CM

## 2021-11-09 DIAGNOSIS — Z955 Presence of coronary angioplasty implant and graft: Secondary | ICD-10-CM

## 2021-11-10 ENCOUNTER — Ambulatory Visit: Payer: BC Managed Care – PPO | Admitting: Cardiology

## 2021-11-11 ENCOUNTER — Encounter (HOSPITAL_COMMUNITY)
Admission: RE | Admit: 2021-11-11 | Discharge: 2021-11-11 | Disposition: A | Discharge status: Home / Self Care | Payer: BC Managed Care – PPO | Source: Ambulatory Visit | Attending: Cardiology | Admitting: Cardiology

## 2021-11-11 ENCOUNTER — Other Ambulatory Visit (HOSPITAL_COMMUNITY): Payer: Self-pay

## 2021-11-11 DIAGNOSIS — Z955 Presence of coronary angioplasty implant and graft: Secondary | ICD-10-CM

## 2021-11-11 DIAGNOSIS — I213 ST elevation (STEMI) myocardial infarction of unspecified site: Secondary | ICD-10-CM

## 2021-11-11 DIAGNOSIS — Z48812 Encounter for surgical aftercare following surgery on the circulatory system: Secondary | ICD-10-CM | POA: Diagnosis not present

## 2021-11-11 MED ORDER — PANTOPRAZOLE SODIUM 40 MG PO TBEC
DELAYED_RELEASE_TABLET | ORAL | 6 refills | Status: DC
Start: 2021-11-11 — End: 2022-04-11
  Filled 2021-11-11: qty 30, 30d supply, fill #0
  Filled 2021-12-08: qty 30, 30d supply, fill #1
  Filled 2022-01-08: qty 30, 30d supply, fill #2
  Filled 2022-02-05: qty 30, 30d supply, fill #3
  Filled 2022-03-06: qty 30, 30d supply, fill #4
  Filled 2022-04-03: qty 30, 30d supply, fill #5

## 2021-11-12 ENCOUNTER — Other Ambulatory Visit (HOSPITAL_COMMUNITY): Payer: Self-pay

## 2021-11-14 ENCOUNTER — Encounter (HOSPITAL_COMMUNITY)
Admission: RE | Admit: 2021-11-14 | Discharge: 2021-11-14 | Disposition: A | Discharge status: Home / Self Care | Payer: BC Managed Care – PPO | Source: Ambulatory Visit | Attending: Cardiology | Admitting: Cardiology

## 2021-11-14 DIAGNOSIS — Z955 Presence of coronary angioplasty implant and graft: Secondary | ICD-10-CM

## 2021-11-14 DIAGNOSIS — Z48812 Encounter for surgical aftercare following surgery on the circulatory system: Secondary | ICD-10-CM | POA: Diagnosis not present

## 2021-11-14 DIAGNOSIS — I213 ST elevation (STEMI) myocardial infarction of unspecified site: Secondary | ICD-10-CM

## 2021-11-16 ENCOUNTER — Encounter (HOSPITAL_COMMUNITY)
Admission: RE | Admit: 2021-11-16 | Discharge: 2021-11-16 | Disposition: A | Discharge status: Home / Self Care | Payer: BC Managed Care – PPO | Source: Ambulatory Visit | Attending: Cardiology | Admitting: Cardiology

## 2021-11-16 VITALS — Ht 66.0 in | Wt 190.7 lb

## 2021-11-16 DIAGNOSIS — Z48812 Encounter for surgical aftercare following surgery on the circulatory system: Secondary | ICD-10-CM | POA: Diagnosis not present

## 2021-11-16 DIAGNOSIS — Z955 Presence of coronary angioplasty implant and graft: Secondary | ICD-10-CM

## 2021-11-16 DIAGNOSIS — I213 ST elevation (STEMI) myocardial infarction of unspecified site: Secondary | ICD-10-CM

## 2021-11-16 NOTE — Progress Notes (Signed)
Cardiac Individual Treatment Plan ? ?Patient Details  ?Name: Bonnie Nelson ?MRN: [id number] ?Date of Birth: January 17, 1966 ?Referring Provider:   ?Flowsheet Row CARDIAC REHAB PHASE II ORIENTATION from 10/11/2021 in [place]  ?Referring Provider Larey Dresser, MD  ? ?  ? ? ?Initial Encounter Date:  ?Flowsheet Row CARDIAC REHAB PHASE II ORIENTATION from 10/11/2021 in [place]  ?Date 10/11/21  ? ?  ? ? ?Visit Diagnosis: 07/20/21 STEMI ? ?07/20/21 S/P DES x 2 LAD ? ?Patient's Home Medications on Admission: ? ?Current Outpatient Medications:  ?  acetaminophen (TYLENOL) 500 MG tablet, Take 1,000 mg by mouth every 6 (six) hours as needed for mild pain or moderate pain., Disp: , Rfl:  ?  ALPRAZolam (XANAX) 0.25 MG tablet, Take 1 tablet by mouth 3 times a day as neeed for anxiety, Disp: 90 tablet, Rfl: 1 ?  aspirin EC 81 MG tablet, Take 81 mg by mouth every evening., Disp: , Rfl:  ?  atorvastatin (LIPITOR) 80 MG tablet, Take 1 tablet (80 mg total) by mouth daily., Disp: 30 tablet, Rfl: 6 ?  Azelastine-Fluticasone 137-50 MCG/ACT SUSP, Use 1 spray in each nostril twice a day, Disp: 23 g, Rfl: 5 ?  desloratadine (CLARINEX) 5 MG tablet, Take 1 tablet by mouth daily (Patient taking differently: Take 5 mg by mouth every evening.), Disp: 30 tablet, Rfl: 5 ?  empagliflozin (JARDIANCE) 10 MG TABS tablet, Take 1 tablet (10 mg total) by mouth daily., Disp: 30 tablet, Rfl: 6 ?  fexofenadine (ALLEGRA) 180 MG tablet, Take 180 mg by mouth in the morning., Disp: , Rfl:  ?  Fluticasone-Umeclidin-Vilant (TRELEGY ELLIPTA) 200-62.5-25 MCG/ACT AEPB, Inhale 1 puff into the lungs once a day., Disp: 60 each, Rfl: 5 ?  ipratropium (ATROVENT) 0.06 % nasal spray, Place 2 sprays into both nostrils daily as needed for allergies., Disp: , Rfl:  ?  levalbuterol (XOPENEX HFA) 45 MCG/ACT inhaler, Inhale 2 puffs into the lungs every 4-6 hours as needed for cough/wheeze., Disp: 15 g, Rfl: 0 ?   metoprolol succinate (TOPROL-XL) 50 MG 24 hr tablet, Take 1 tablet (50 mg total) by mouth daily. Take with or immediately following a meal., Disp: 30 tablet, Rfl: 6 ?  montelukast (SINGULAIR) 10 MG tablet, Take 10 mg by mouth at bedtime. , Disp: , Rfl:  ?  montelukast (SINGULAIR) 10 MG tablet, Take 1 tablet (10 mg total) by mouth daily., Disp: 90 tablet, Rfl: 3 ?  nitroGLYCERIN (NITROSTAT) 0.4 MG SL tablet, Place 1 tablet (0.4 mg total) under the tongue every 5 (five) minutes x 3 doses as needed for chest pain., Disp: 25 tablet, Rfl: 1 ?  pantoprazole (PROTONIX) 40 MG tablet, Take 1 tablet (40 mg total) by mouth daily at 12 noon. (Patient taking differently: Take 40 mg by mouth every evening.), Disp: 30 tablet, Rfl: 1 ?  pantoprazole (PROTONIX) 40 MG tablet, Take 1 tablet by mouth daily, Disp: 30 tablet, Rfl: 6 ?  sacubitril-valsartan (ENTRESTO) 24-26 MG, Take 1 tablet by mouth 2 (two) times daily., Disp: 60 tablet, Rfl: 6 ?  sertraline (ZOLOFT) 100 MG tablet, Take 150 mg by mouth daily., Disp: , Rfl:  ?  sertraline (ZOLOFT) 100 MG tablet, TAKE 1 AND 1/2 TABLETS BY MOUTH EVERY DAY, Disp: 135 tablet, Rfl: 1 ?  spironolactone (ALDACTONE) 25 MG tablet, Take 1/2 tablet by mouth daily., Disp: 90 tablet, Rfl: 3 ?  ticagrelor (BRILINTA) 90 MG TABS tablet, Take 1 tablet (90  mg total) by mouth 2 (two) times daily., Disp: 60 tablet, Rfl: 6 ? ?Past Medical History: ?Past Medical History:  ?Diagnosis Date  ? Anxiety   ? Arthritis   ? Asthma   ? CHF (congestive heart failure) ([place])   ? Coronary artery disease   ? Myocardial infarct [facility])   ? Pericarditis   ? Pneumonia   ? ? ?Tobacco Use: ?Social History  ? ?Tobacco Use  ?Smoking Status Never  ?Smokeless Tobacco Never  ? ? ?Labs: ?Review Flowsheet   ? ?  ?  Latest Ref Rng & Units 09/14/2019 09/15/2019 07/20/2021  ?Labs for ITP Cardiac and Pulmonary Rehab  ?Cholestrol 0 - 200 mg/dL  125   148    ? 165    ?LDL (calc) 0 - 99 mg/dL  62   97    ? 96    ?HDL-C >40 mg/dL  48   45    ? 51     ?Trlycerides <150 mg/dL  74   31    ? 89    ?Hemoglobin A1c 4.8 - 5.6 % 5.6    5.6    ?  ? ? Multiple values from one day are sorted in reverse-chronological order  ?  ?  ? ? ?Capillary Blood Glucose: ?Lab Results  ?Component Value Date  ? GLUCAP 117 (H) 07/20/2021  ? ? ? ?Exercise Target Goals: ?Exercise Program Goal: ?Individual exercise prescription set using results from initial 6 min walk test and THRR while considering  patient?s activity barriers and safety.  ? ?Exercise Prescription Goal: ?Starting with aerobic activity 30 plus minutes a day, 3 days per week for initial exercise prescription. Provide home exercise prescription and guidelines that participant acknowledges understanding prior to discharge. ? ?Activity Barriers & Risk Stratification: ? Activity Barriers & Cardiac Risk Stratification - 10/11/21 1441   ? ?  ? Activity Barriers & Cardiac Risk Stratification  ? Activity Barriers Arthritis;Other (comment)   ? Comments Torn meniscus x2 right knee w/ surgery August 2022. Arthritis both knees.   ? Cardiac Risk Stratification High   ? ?  ?  ? ?  ? ? ?6 Minute Walk: ? 6 Minute Walk   ? ? [place] Name 10/11/21 1356  ?  ?  ?  ? 6 Minute Walk  ? Phase Initial    ? Distance 2109 feet    ? Walk Time 6 minutes    ? # of Rest Breaks 0    ? MPH 3.99    ? METS 4.97    ? RPE 12    ? Perceived Dyspnea  0    ? VO2 Peak 17.41    ? Symptoms No    ? Resting HR 72 bpm    ? Resting BP 122/68    ? Resting Oxygen Saturation  97 %    ? Exercise Oxygen Saturation  during 6 min walk 97 %    ? Max Ex. HR 105 bpm    ? Max Ex. BP 146/56    ? 2 Minute Post BP 138/74    ? ?  ?  ? ?  ? ? ?Oxygen Initial Assessment: ? ? ?Oxygen Re-Evaluation: ? ? ?Oxygen Discharge (Final Oxygen Re-Evaluation): ? ? ?Initial Exercise Prescription: ? Initial Exercise Prescription - 10/11/21 1400   ? ?  ? Date of Initial Exercise RX and Referring Provider  ? Date 10/11/21   ? Referring Provider Larey Dresser, MD   ? Expected Discharge Date 12/09/21   ?   ?  Treadmill  ? MPH 2.5   ? Grade 0   ? Minutes 15   ? METs 2.91   ?  ? NuStep  ? Level 2   ? SPM 85   ? Minutes 15   ? METs 2.5   ?  ? Prescription Details  ? Frequency (times per week) 3   ? Duration Progress to 30 minutes of continuous aerobic without signs/symptoms of physical distress   ?  ? Intensity  ? THRR 40-80% of Max Heartrate 65-131   ? Ratings of Perceived Exertion 11-13   ? Perceived Dyspnea 0-4   ?  ? Progression  ? Progression Continue to progress workloads to maintain intensity without signs/symptoms of physical distress.   ?  ? Resistance Training  ? Training Prescription Yes   ? Weight 3 lbs   ? Reps 10-15   ? ?  ?  ? ?  ? ? ?Perform Capillary Blood Glucose checks as needed. ? ?Exercise Prescription Changes: ? ? Exercise Prescription Changes   ? ? [place] Name 10/17/21 1600 10/28/21 1631  ?  ?  ?  ?  ? Response to Exercise  ? Blood Pressure (Admit) 108/78 124/68     ? Blood Pressure (Exercise) 118/68 142/64     ? Blood Pressure (Exit) 102/60 104/70     ? Heart Rate (Admit) 83 bpm 85 bpm     ? Heart Rate (Exercise) 114 bpm 112 bpm     ? Heart Rate (Exit) 78 bpm 84 bpm     ? Rating of Perceived Exertion (Exercise) 11 11     ? Perceived Dyspnea (Exercise) 0 0     ? Symptoms 0 0     ? Comments Pt first day in the CRP2 program Reviewed MET's, goals and home ExRx     ? Duration Progress to 30 minutes of  aerobic without signs/symptoms of physical distress Progress to 30 minutes of  aerobic without signs/symptoms of physical distress     ? Intensity THRR unchanged THRR unchanged     ?  ? Progression  ? Progression Continue to progress workloads to maintain intensity without signs/symptoms of physical distress. Continue to progress workloads to maintain intensity without signs/symptoms of physical distress.     ? Average METs 2.67 3.15     ?  ? Resistance Training  ? Training Prescription Yes Yes     ? Weight 3 lbs 4 lbs     ? Reps 10-15 10-15     ? Time -- 10 Minutes     ?  ? Treadmill  ? MPH 2.6 3     ?  Minutes 15 15     ? METs 2.91 3     ?  ? NuStep  ? Level 2 2     ? SPM 85 85     ? Minutes 15 15     ? METs 2.4 3     ?  ? Home Exercise Plan  ? Plans to continue exercise at -- Home (comment)     ? Frequency

## 2021-11-17 NOTE — Progress Notes (Signed)
Bonnie Nelson 56 y.o. female ?Nutrition Note ?Bonnie Nelson is motivated to make lifestyle changes to aid with cardiac rehab. Patient has medical history of acute pericarditis, NSTEMI, CAD, ischemic cardiomyopathy. She reports family history of heart disease,obesity and significant understanding of Mediterranean diet. She reports losing 20-30# over the last 2 years and losing 0.5# per week. She does have an appointment to start [facility] for weight loss. She does report some history of emotional eating.  ? ?Nutrition Diagnosis ?Obese related to excessive energy intake as evidenced by a BMI of 30.7 ? ?Nutrition Intervention ?Pt?s individual nutrition plan reviewed with pt. ?Benefits of adopting Heart Healthy diet discussed.  ?Continue client-centered nutrition education by RD, as part of interdisciplinary care. ? ?Monitor/Evaluation: ?Patient reports motivation to make lifestyle changes for adherence to heart healthy diet recommendation and weight management. We discussed weight loss trends/goals. Offered follow-up as needed for emotional eating support. Patient amicable to RD suggestions and verbalizes understanding.  ? ?8 minutes spent in review of topics related to a heart healthy diet including sodium intake, blood sugar control, weight management, and fiber intake. ? ?Goal(s) ?Pt to identify and limit food sources of saturated fat, trans fat, refined carbohydrates and sodium ?Pt to identify food quantities necessary to achieve weight loss of 6-24 lb at graduation from cardiac rehab.  ?Pt able to name foods that affect blood glucose. Continue to limit simple sugars, refined carbohydrates, sugary beverages, etc.  ?Pt to describe the benefit of including lean protein/plant proteins, fruits, vegetables, whole grains, nuts/seeds, and low-fat dairy products in a heart healthy meal plan. ?Pt to practice mindful and intuitive eating exercises ? ?Plan:  ?Will provide client-centered nutrition education as part of interdisciplinary  care ?Monitor and evaluate progress toward nutrition goal with team. ? ? ?Lelon Mast [place], MS, RDN, LDN ? ?

## 2021-11-18 ENCOUNTER — Encounter (HOSPITAL_COMMUNITY): Payer: BC Managed Care – PPO

## 2021-11-21 ENCOUNTER — Encounter (HOSPITAL_COMMUNITY)
Admission: RE | Admit: 2021-11-21 | Discharge: 2021-11-21 | Disposition: A | Discharge status: Home / Self Care | Payer: BC Managed Care – PPO | Source: Ambulatory Visit | Attending: Cardiology | Admitting: Cardiology

## 2021-11-21 ENCOUNTER — Other Ambulatory Visit (HOSPITAL_COMMUNITY): Payer: Self-pay

## 2021-11-21 DIAGNOSIS — Z48812 Encounter for surgical aftercare following surgery on the circulatory system: Secondary | ICD-10-CM | POA: Diagnosis not present

## 2021-11-21 DIAGNOSIS — Z955 Presence of coronary angioplasty implant and graft: Secondary | ICD-10-CM

## 2021-11-21 DIAGNOSIS — I213 ST elevation (STEMI) myocardial infarction of unspecified site: Secondary | ICD-10-CM

## 2021-11-21 MED ORDER — WEGOVY 1 MG/0.5ML ~~LOC~~ SOAJ
SUBCUTANEOUS | 0 refills | Status: DC
  Filled 2021-12-31: qty 2, 28d supply, fill #0

## 2021-11-21 MED ORDER — WEGOVY 0.5 MG/0.5ML ~~LOC~~ SOAJ
SUBCUTANEOUS | 0 refills | Status: DC
Start: 2021-11-21 — End: 2022-03-09
  Filled 2021-11-21 – 2021-12-09 (×2): qty 2, 28d supply, fill #0

## 2021-11-22 ENCOUNTER — Other Ambulatory Visit (HOSPITAL_COMMUNITY): Payer: Self-pay

## 2021-11-23 ENCOUNTER — Encounter (HOSPITAL_COMMUNITY)
Admission: RE | Admit: 2021-11-23 | Discharge: 2021-11-23 | Disposition: A | Discharge status: Home / Self Care | Payer: BC Managed Care – PPO | Source: Ambulatory Visit | Attending: Cardiology | Admitting: Cardiology

## 2021-11-23 ENCOUNTER — Other Ambulatory Visit (HOSPITAL_COMMUNITY): Payer: Self-pay

## 2021-11-23 DIAGNOSIS — Z48812 Encounter for surgical aftercare following surgery on the circulatory system: Secondary | ICD-10-CM | POA: Diagnosis not present

## 2021-11-23 DIAGNOSIS — Z955 Presence of coronary angioplasty implant and graft: Secondary | ICD-10-CM

## 2021-11-23 DIAGNOSIS — I213 ST elevation (STEMI) myocardial infarction of unspecified site: Secondary | ICD-10-CM

## 2021-11-25 ENCOUNTER — Encounter (HOSPITAL_COMMUNITY)
Admission: RE | Admit: 2021-11-25 | Discharge: 2021-11-25 | Disposition: A | Discharge status: Home / Self Care | Payer: BC Managed Care – PPO | Source: Ambulatory Visit | Attending: Cardiology | Admitting: Cardiology

## 2021-11-25 DIAGNOSIS — Z955 Presence of coronary angioplasty implant and graft: Secondary | ICD-10-CM

## 2021-11-25 DIAGNOSIS — I213 ST elevation (STEMI) myocardial infarction of unspecified site: Secondary | ICD-10-CM

## 2021-11-25 DIAGNOSIS — Z48812 Encounter for surgical aftercare following surgery on the circulatory system: Secondary | ICD-10-CM | POA: Diagnosis not present

## 2021-11-28 ENCOUNTER — Encounter (HOSPITAL_COMMUNITY)
Admission: RE | Admit: 2021-11-28 | Discharge: 2021-11-28 | Disposition: A | Discharge status: Home / Self Care | Payer: BC Managed Care – PPO | Source: Ambulatory Visit | Attending: Cardiology | Admitting: Cardiology

## 2021-11-28 DIAGNOSIS — Z48812 Encounter for surgical aftercare following surgery on the circulatory system: Secondary | ICD-10-CM | POA: Diagnosis not present

## 2021-11-29 ENCOUNTER — Other Ambulatory Visit (HOSPITAL_COMMUNITY): Payer: Self-pay

## 2021-11-30 ENCOUNTER — Other Ambulatory Visit (HOSPITAL_COMMUNITY): Payer: Self-pay

## 2021-11-30 ENCOUNTER — Encounter (HOSPITAL_COMMUNITY)
Admission: RE | Admit: 2021-11-30 | Discharge: 2021-11-30 | Disposition: A | Discharge status: Home / Self Care | Payer: BC Managed Care – PPO | Source: Ambulatory Visit | Attending: Cardiology | Admitting: Cardiology

## 2021-11-30 DIAGNOSIS — Z48812 Encounter for surgical aftercare following surgery on the circulatory system: Secondary | ICD-10-CM | POA: Diagnosis not present

## 2021-11-30 DIAGNOSIS — Z955 Presence of coronary angioplasty implant and graft: Secondary | ICD-10-CM

## 2021-11-30 DIAGNOSIS — I213 ST elevation (STEMI) myocardial infarction of unspecified site: Secondary | ICD-10-CM

## 2021-12-02 ENCOUNTER — Encounter (HOSPITAL_COMMUNITY): Payer: BC Managed Care – PPO

## 2021-12-02 ENCOUNTER — Other Ambulatory Visit (HOSPITAL_COMMUNITY): Payer: Self-pay

## 2021-12-02 ENCOUNTER — Emergency Department (HOSPITAL_BASED_OUTPATIENT_CLINIC_OR_DEPARTMENT_OTHER)
Admission: EM | Admit: 2021-12-02 | Discharge: 2021-12-02 | Disposition: A | Discharge status: Home / Self Care | Payer: BC Managed Care – PPO | Attending: Emergency Medicine | Admitting: Emergency Medicine

## 2021-12-02 ENCOUNTER — Other Ambulatory Visit (HOSPITAL_BASED_OUTPATIENT_CLINIC_OR_DEPARTMENT_OTHER): Payer: Self-pay

## 2021-12-02 ENCOUNTER — Encounter (HOSPITAL_BASED_OUTPATIENT_CLINIC_OR_DEPARTMENT_OTHER): Payer: Self-pay | Admitting: Emergency Medicine

## 2021-12-02 ENCOUNTER — Emergency Department (HOSPITAL_BASED_OUTPATIENT_CLINIC_OR_DEPARTMENT_OTHER): Payer: BC Managed Care – PPO

## 2021-12-02 ENCOUNTER — Telehealth (HOSPITAL_COMMUNITY): Payer: Self-pay | Admitting: Internal Medicine

## 2021-12-02 ENCOUNTER — Other Ambulatory Visit: Payer: Self-pay

## 2021-12-02 DIAGNOSIS — I509 Heart failure, unspecified: Secondary | ICD-10-CM | POA: Insufficient documentation

## 2021-12-02 DIAGNOSIS — D72829 Elevated white blood cell count, unspecified: Secondary | ICD-10-CM | POA: Insufficient documentation

## 2021-12-02 DIAGNOSIS — N189 Chronic kidney disease, unspecified: Secondary | ICD-10-CM | POA: Diagnosis not present

## 2021-12-02 DIAGNOSIS — Z7982 Long term (current) use of aspirin: Secondary | ICD-10-CM | POA: Insufficient documentation

## 2021-12-02 DIAGNOSIS — I2510 Atherosclerotic heart disease of native coronary artery without angina pectoris: Secondary | ICD-10-CM | POA: Insufficient documentation

## 2021-12-02 DIAGNOSIS — R1030 Lower abdominal pain, unspecified: Secondary | ICD-10-CM | POA: Diagnosis present

## 2021-12-02 DIAGNOSIS — K5732 Diverticulitis of large intestine without perforation or abscess without bleeding: Secondary | ICD-10-CM | POA: Diagnosis not present

## 2021-12-02 DIAGNOSIS — Z79899 Other long term (current) drug therapy: Secondary | ICD-10-CM | POA: Insufficient documentation

## 2021-12-02 LAB — CBC
HCT: 43.3 % (ref 36.0–46.0)
Hemoglobin: 13.7 g/dL (ref 12.0–15.0)
MCH: 26.7 pg (ref 26.0–34.0)
MCHC: 31.6 g/dL (ref 30.0–36.0)
MCV: 84.2 fL (ref 80.0–100.0)
Platelets: 263 10*3/uL (ref 150–400)
RBC: 5.14 MIL/uL — ABNORMAL HIGH (ref 3.87–5.11)
RDW: 14.9 % (ref 11.5–15.5)
WBC: 13.9 10*3/uL — ABNORMAL HIGH (ref 4.0–10.5)
nRBC: 0 % (ref 0.0–0.2)

## 2021-12-02 LAB — COMPREHENSIVE METABOLIC PANEL
ALT: 18 U/L (ref 0–44)
AST: 21 U/L (ref 15–41)
Albumin: 4.5 g/dL (ref 3.5–5.0)
Alkaline Phosphatase: 87 U/L (ref 38–126)
Anion gap: 11 (ref 5–15)
BUN: 24 mg/dL — ABNORMAL HIGH (ref 6–20)
CO2: 23 mmol/L (ref 22–32)
Calcium: 10 mg/dL (ref 8.9–10.3)
Chloride: 104 mmol/L (ref 98–111)
Creatinine, Ser: 1.15 mg/dL — ABNORMAL HIGH (ref 0.44–1.00)
GFR, Estimated: 56 mL/min — ABNORMAL LOW (ref >60)
Glucose, Bld: 102 mg/dL — ABNORMAL HIGH (ref 70–99)
Potassium: 3.6 mmol/L (ref 3.5–5.1)
Sodium: 138 mmol/L (ref 135–145)
Total Bilirubin: 0.6 mg/dL (ref 0.3–1.2)
Total Protein: 7.7 g/dL (ref 6.5–8.1)

## 2021-12-02 LAB — URINALYSIS, ROUTINE W REFLEX MICROSCOPIC
Bilirubin Urine: NEGATIVE
Glucose, UA: 500 mg/dL — AB
Hgb urine dipstick: NEGATIVE
Ketones, ur: NEGATIVE mg/dL
Leukocytes,Ua: NEGATIVE
Nitrite: NEGATIVE
Protein, ur: NEGATIVE mg/dL
Specific Gravity, Urine: 1.007 (ref 1.005–1.030)
pH: 5 (ref 5.0–8.0)

## 2021-12-02 LAB — LIPASE, BLOOD: Lipase: 24 U/L (ref 11–51)

## 2021-12-02 MED ORDER — AMOXICILLIN-POT CLAVULANATE 875-125 MG PO TABS
1.0000 | ORAL_TABLET | ORAL | 0 refills | Status: DC
  Filled 2021-12-02: qty 20, 10d supply, fill #0
  Filled 2021-12-02: qty 15, 5d supply, fill #0

## 2021-12-02 MED ORDER — IOHEXOL 300 MG/ML  SOLN
100.0000 mL | Freq: Once | INTRAMUSCULAR | Status: AC | PRN
  Administered 2021-12-02: 100 mL via INTRAVENOUS

## 2021-12-02 NOTE — ED Provider Notes (Signed)
?[facility] EMERGENCY DEPT ?Provider Note ? ? ?CSN: [id number] ?Arrival date & time: 12/02/21  1301 ? ?  ? ?History ? ?Chief Complaint  ?Patient presents with  ? Abdominal Pain  ? ? ?Bonnie Nelson is a 56 y.o. female with history of STEMI in December, CHF, CAD and early CKD who presents to the ED for evaluation of suprapubic abdominal pain with elevated white blood cell count.  Patient states that she started medication called Wegovy at the recommendation of her cardiologist for weight loss 6 days ago, and 2 days ago she developed suprapubic pain that she describes as soreness.  Pain is worse with palpation and just before she needs to go to the bathroom.  She is also endorsing mild aching rectal pain when she is sitting down.  Patient had a follow-up appointment with her nephrologist yesterday for her checkup where she had labs drawn.  And when she messaged she messaged her PCP today regarding her abdominal pain, PCP noted that her labs had a white blood cell count of 13.  Given the abdominal pain in setting of leukocytosis, she referred her to the ED for evaluation.  Patient initially thought that her stomach pains were a side effect from the new medication.  Patient denies chest pain, shortness of breath, nausea, vomiting, diarrhea, urinary symptoms, melena, hematochezia. ? ? ?Abdominal Pain ? ?  ? ?Home Medications ?Prior to Admission medications   ?Medication Sig Start Date End Date Taking? Authorizing Provider  ?amoxicillin-clavulanate (AUGMENTIN) 875-125 MG tablet Take 1 tablet by mouth twice daily for 10 days. 12/02/21  Yes Raynald Blend R, PA-C  ?acetaminophen (TYLENOL) 500 MG tablet Take 1,000 mg by mouth every 6 (six) hours as needed for mild pain or moderate pain.    [provider]  ?ALPRAZolam Prudy Feeler) 0.25 MG tablet Take 1 tablet by mouth 3 times a day as neeed for anxiety 03/22/21     ?aspirin EC 81 MG tablet Take 81 mg by mouth every evening.    [provider]   ?atorvastatin (LIPITOR) 80 MG tablet Take 1 tablet (80 mg total) by mouth daily. 07/23/21   Orpah Cobb, MD  ?Azelastine-Fluticasone 137-50 MCG/ACT SUSP Use 1 spray in each nostril twice a day 10/17/21     ?desloratadine (CLARINEX) 5 MG tablet Take 1 tablet by mouth daily ?Patient taking differently: Take 5 mg by mouth every evening. 10/04/21     ?empagliflozin (JARDIANCE) 10 MG TABS tablet Take 1 tablet (10 mg total) by mouth daily. 07/23/21   Orpah Cobb, MD  ?fexofenadine (ALLEGRA) 180 MG tablet Take 180 mg by mouth in the morning.    [provider]  ?Fluticasone-Umeclidin-Vilant (TRELEGY ELLIPTA) 200-62.5-25 MCG/ACT AEPB Inhale 1 puff into the lungs once a day. 08/09/21     ?ipratropium (ATROVENT) 0.06 % nasal spray Place 2 sprays into both nostrils daily as needed for allergies. 10/26/20   [provider]  ?levalbuterol Pauline Aus HFA) 45 MCG/ACT inhaler Inhale 2 puffs into the lungs every 4-6 hours as needed for cough/wheeze. 08/09/21     ?metoprolol succinate (TOPROL-XL) 50 MG 24 hr tablet Take 1 tablet (50 mg total) by mouth daily. Take with or immediately following a meal. 07/23/21   Orpah Cobb, MD  ?montelukast (SINGULAIR) 10 MG tablet Take 10 mg by mouth at bedtime.  09/03/19   [provider]  ?montelukast (SINGULAIR) 10 MG tablet Take 1 tablet (10 mg total) by mouth daily. 04/12/21     ?nitroGLYCERIN (NITROSTAT) 0.4 MG SL  tablet Place 1 tablet (0.4 mg total) under the tongue every 5 (five) minutes x 3 doses as needed for chest pain. 09/18/19   Orpah CobbKadakia, Ajay, MD  ?pantoprazole (PROTONIX) 40 MG tablet Take 1 tablet (40 mg total) by mouth daily at 12 noon. ?Patient taking differently: Take 40 mg by mouth every evening. 10/07/19   Orpah CobbKadakia, Ajay, MD  ?pantoprazole (PROTONIX) 40 MG tablet Take 1 tablet by mouth daily 11/11/21     ?sacubitril-valsartan (ENTRESTO) 24-26 MG Take 1 tablet by mouth 2 (two) times daily. 07/22/21   Orpah CobbKadakia, Ajay, MD  ?Semaglutide-Weight Management [facility](WEGOVY) 0.5  MG/0.5ML SOAJ Inject 1 pen (0.5 MG) into the skin once a week 11/21/21     ?Semaglutide-Weight Management (WEGOVY) 1 MG/0.5ML SOAJ Inject 1 pen (1 MG) into the skin once a week 11/21/21     ?sertraline (ZOLOFT) 100 MG tablet Take 150 mg by mouth daily. 07/21/19   [provider]  ?sertraline (ZOLOFT) 100 MG tablet TAKE 1 AND 1/2 TABLETS BY MOUTH EVERY DAY 10/24/21     ?spironolactone (ALDACTONE) 25 MG tablet Take 1/2 tablet by mouth daily. 08/02/21 01/29/22  Allayne ButcherSimmons, Brittainy M, PA-C  ?ticagrelor (BRILINTA) 90 MG TABS tablet Take 1 tablet (90 mg total) by mouth 2 (two) times daily. 07/22/21   Orpah CobbKadakia, Ajay, MD  ?   ? ?Allergies    ?Patient has no known allergies.   ? ?Review of Systems   ?Review of Systems  ?Gastrointestinal:  Positive for abdominal pain.  ? ?Physical Exam ?Updated Vital Signs ?BP 122/67 (BP Location: Right Arm)   Pulse 64   Temp 98 ?F (36.7 ?C) (Oral)   Resp 16   SpO2 97%  ?Physical Exam ?Vitals and nursing note reviewed.  ?Constitutional:   ?   General: She is not in acute distress. ?   Appearance: She is not ill-appearing.  ?HENT:  ?   Head: Atraumatic.  ?Eyes:  ?   Conjunctiva/sclera: Conjunctivae normal.  ?Cardiovascular:  ?   Rate and Rhythm: Normal rate and regular rhythm.  ?   Pulses: Normal pulses.  ?   Heart sounds: No murmur heard. ?Pulmonary:  ?   Effort: Pulmonary effort is normal. No respiratory distress.  ?   Breath sounds: Normal breath sounds.  ?Abdominal:  ?   General: Abdomen is flat. There is no distension.  ?   Palpations: Abdomen is soft.  ?   Tenderness: There is abdominal tenderness in the suprapubic area. There is no right CVA tenderness or left CVA tenderness. Negative signs include Murphy's sign and McBurney's sign.  ?Musculoskeletal:     ?   General: Normal range of motion.  ?   Cervical back: Normal range of motion.  ?Skin: ?   General: Skin is warm and dry.  ?   Capillary Refill: Capillary refill takes less than 2 seconds.  ?Neurological:  ?   General: No  focal deficit present.  ?   Mental Status: She is alert.  ?Psychiatric:     ?   Mood and Affect: Mood normal.  ? ? ?ED Results / Procedures / Treatments   ?Labs ?(all labs ordered are listed, but only abnormal results are displayed) ?Labs Reviewed  ?COMPREHENSIVE METABOLIC PANEL - Abnormal; Notable for the following components:  ?    Result Value  ? Glucose, Bld 102 (*)   ? BUN 24 (*)   ? Creatinine, Ser 1.15 (*)   ? GFR, Estimated 56 (*)   ? All other components within  normal limits  ?CBC - Abnormal; Notable for the following components:  ? WBC 13.9 (*)   ? RBC 5.14 (*)   ? All other components within normal limits  ?URINALYSIS, ROUTINE W REFLEX MICROSCOPIC - Abnormal; Notable for the following components:  ? Color, Urine COLORLESS (*)   ? Glucose, UA 500 (*)   ? Bacteria, UA RARE (*)   ? All other components within normal limits  ?LIPASE, BLOOD  ? ? ?EKG ?None ? ?Radiology ?CT ABDOMEN PELVIS W CONTRAST ? ?Addendum Date: 12/02/2021   ?ADDENDUM REPORT: 12/02/2021 15:03 ADDENDUM: Discussed case with ER provider: The focal collection in the right lower hemipelvis may be unrelated to the focal inflammatory process involving the sigmoid colon, as this is on the opposite side of the pelvis, and may reflect simple unrelated pelvic free fluid. Regardless, due to size, this small collection is likely not drainable. Recommend close clinical attention during treatment and repeat imaging as clinically indicated. Electronically Signed   By: Caprice Renshaw M.D.   On: 12/02/2021 15:03  ? ?Result Date: 12/02/2021 ?CLINICAL DATA:  Abdominal pain, acute, nonlocalized EXAM: CT ABDOMEN AND PELVIS WITH CONTRAST TECHNIQUE: Multidetector CT imaging of the abdomen and pelvis was performed using the standard protocol following bolus administration of intravenous contrast. RADIATION DOSE REDUCTION: This exam was performed according to the departmental dose-optimization program which includes automated exposure control, adjustment of the mA  and/or kV according to patient size and/or use of iterative reconstruction technique. CONTRAST:  OMNIPAQUE IOHEXOL 300 MG/ML  SOLN COMPARISON:  None. FINDINGS: Lower chest: No acute abnormality. Hepatobiliary: No focal l

## 2021-12-02 NOTE — ED Triage Notes (Addendum)
Pt c/o lower mid abdominal/suprapubic pain for 2 days, along with rectal pressure when she sits.  Pt started wegovy last Saturday.  Denies any urinary symptoms or abnormal bowel movements/patterns.  Pt also reports elevated WBC from bloodwork taken yesterday during routine nephrology followup. ?

## 2021-12-02 NOTE — Discharge Instructions (Addendum)
You were diagnosed with diverticulitis today and I have sent you in an antibiotic that you will take three times daily for 5 days. Please follow up with your PCP if symptoms do not improve ? ?Return to the ED if symptoms worsen  ?

## 2021-12-02 NOTE — ED Notes (Signed)
Discharge instructions, follow up care, and prescriptions reviewed and explained, pt verbalized understanding.  

## 2021-12-05 ENCOUNTER — Telehealth (HOSPITAL_COMMUNITY): Payer: Self-pay | Admitting: Internal Medicine

## 2021-12-05 ENCOUNTER — Telehealth (HOSPITAL_COMMUNITY): Payer: Self-pay | Admitting: *Deleted

## 2021-12-05 ENCOUNTER — Encounter (HOSPITAL_COMMUNITY): Payer: BC Managed Care – PPO

## 2021-12-05 NOTE — Telephone Encounter (Signed)
Spoke with Bonnie Nelson she will call Dr Carolee Rota office to see if she can get an appointment to be seen regarding her recent ED visit. [name] will not attend exercise this afternoon.[name] RN BSN  ?

## 2021-12-05 NOTE — Telephone Encounter (Signed)
Spoke with Rashanda she was treated and released from the ED on Friday and has returned to work. Will check with Dr Renne Crigler about Carmell returning to exercise at cardiac rehab.Gladstone Lighter, RN,BSN ?12/05/2021 9:27 AM  ?

## 2021-12-07 ENCOUNTER — Encounter (HOSPITAL_COMMUNITY): Payer: BC Managed Care – PPO

## 2021-12-08 ENCOUNTER — Other Ambulatory Visit (HOSPITAL_COMMUNITY): Payer: Self-pay

## 2021-12-08 ENCOUNTER — Ambulatory Visit (HOSPITAL_COMMUNITY)
Admission: RE | Admit: 2021-12-08 | Discharge: 2021-12-08 | Disposition: A | Discharge status: Home / Self Care | Payer: BC Managed Care – PPO | Source: Ambulatory Visit | Attending: Cardiology | Admitting: Cardiology

## 2021-12-08 ENCOUNTER — Encounter (HOSPITAL_COMMUNITY): Payer: Self-pay | Admitting: Cardiology

## 2021-12-08 ENCOUNTER — Ambulatory Visit (HOSPITAL_BASED_OUTPATIENT_CLINIC_OR_DEPARTMENT_OTHER)
Admission: RE | Admit: 2021-12-08 | Discharge: 2021-12-08 | Disposition: A | Discharge status: Home / Self Care | Payer: BC Managed Care – PPO | Source: Ambulatory Visit | Attending: Cardiology | Admitting: Cardiology

## 2021-12-08 VITALS — BP 130/70 | HR 80 | Wt 187.0 lb

## 2021-12-08 DIAGNOSIS — Z7902 Long term (current) use of antithrombotics/antiplatelets: Secondary | ICD-10-CM | POA: Diagnosis not present

## 2021-12-08 DIAGNOSIS — I2510 Atherosclerotic heart disease of native coronary artery without angina pectoris: Secondary | ICD-10-CM

## 2021-12-08 DIAGNOSIS — I482 Chronic atrial fibrillation, unspecified: Secondary | ICD-10-CM | POA: Diagnosis not present

## 2021-12-08 DIAGNOSIS — Z7985 Long-term (current) use of injectable non-insulin antidiabetic drugs: Secondary | ICD-10-CM | POA: Diagnosis not present

## 2021-12-08 DIAGNOSIS — I5022 Chronic systolic (congestive) heart failure: Secondary | ICD-10-CM | POA: Insufficient documentation

## 2021-12-08 DIAGNOSIS — R9431 Abnormal electrocardiogram [ECG] [EKG]: Secondary | ICD-10-CM | POA: Diagnosis not present

## 2021-12-08 DIAGNOSIS — I255 Ischemic cardiomyopathy: Secondary | ICD-10-CM | POA: Insufficient documentation

## 2021-12-08 DIAGNOSIS — Z79899 Other long term (current) drug therapy: Secondary | ICD-10-CM | POA: Insufficient documentation

## 2021-12-08 DIAGNOSIS — R0683 Snoring: Secondary | ICD-10-CM | POA: Diagnosis not present

## 2021-12-08 DIAGNOSIS — Z7984 Long term (current) use of oral hypoglycemic drugs: Secondary | ICD-10-CM | POA: Diagnosis not present

## 2021-12-08 DIAGNOSIS — Z6830 Body mass index (BMI) 30.0-30.9, adult: Secondary | ICD-10-CM | POA: Insufficient documentation

## 2021-12-08 DIAGNOSIS — Z955 Presence of coronary angioplasty implant and graft: Secondary | ICD-10-CM | POA: Insufficient documentation

## 2021-12-08 DIAGNOSIS — E669 Obesity, unspecified: Secondary | ICD-10-CM | POA: Diagnosis not present

## 2021-12-08 DIAGNOSIS — Z7982 Long term (current) use of aspirin: Secondary | ICD-10-CM | POA: Insufficient documentation

## 2021-12-08 DIAGNOSIS — Z8679 Personal history of other diseases of the circulatory system: Secondary | ICD-10-CM | POA: Insufficient documentation

## 2021-12-08 DIAGNOSIS — Z8249 Family history of ischemic heart disease and other diseases of the circulatory system: Secondary | ICD-10-CM | POA: Diagnosis not present

## 2021-12-08 DIAGNOSIS — Z7901 Long term (current) use of anticoagulants: Secondary | ICD-10-CM | POA: Insufficient documentation

## 2021-12-08 DIAGNOSIS — E785 Hyperlipidemia, unspecified: Secondary | ICD-10-CM

## 2021-12-08 DIAGNOSIS — I252 Old myocardial infarction: Secondary | ICD-10-CM | POA: Insufficient documentation

## 2021-12-08 LAB — LIPID PANEL
Cholesterol: 97 mg/dL (ref 0–200)
HDL: 38 mg/dL — ABNORMAL LOW (ref >40)
LDL Cholesterol: 42 mg/dL (ref 0–99)
Total CHOL/HDL Ratio: 2.6 RATIO
Triglycerides: 87 mg/dL (ref <150)
VLDL: 17 mg/dL (ref 0–40)

## 2021-12-08 LAB — ECHOCARDIOGRAM COMPLETE
Area-P 1/2: 2.5 cm2
S' Lateral: 2.6 cm

## 2021-12-08 NOTE — Patient Instructions (Signed)
CONGRATULATIONS YOU HAVE GRADUATED FROM THE [facility]!!!!!! ? ?There has been no changes to your medications. ? ?Labs done today, your results will be available in MyChart, we will contact you for abnormal readings. ? ?Please follow up with Dr. Marlou Porch in 3 months. ? ?If you have any questions or concerns before your next appointment please send [place] a message through [place] or call our office at [phone].   ? ?TO LEAVE A MESSAGE FOR THE NURSE SELECT OPTION 2, PLEASE LEAVE A MESSAGE INCLUDING: ?YOUR NAME ?DATE OF BIRTH ?CALL BACK NUMBER ?REASON FOR CALL**this is important as we prioritize the call backs ? ?YOU WILL RECEIVE A CALL BACK THE SAME DAY AS LONG AS YOU CALL BEFORE 4:00 PM ? ?At the [facility], you and your health needs are our priority. As part of our continuing mission to provide you with exceptional heart care, we have created designated Provider Care Teams. These Care Teams include your primary Cardiologist (physician) and Advanced Practice Providers (APPs- Physician Assistants and Nurse Practitioners) who all work together to provide you with the care you need, when you need it.  ? ?You may see any of the following providers on your designated Care Team at your next follow up: ?Dr Glori Bickers ?Dr Loralie Champagne ?Darrick Grinder, NP ?Lyda Jester, PA ?Jessica Milford,NP ?Marlyce Huge, PA ?Audry Riles, PharmD ? ? ?Please be sure to bring in all your medications bottles to every appointment.  ? ? ? ?

## 2021-12-09 ENCOUNTER — Encounter (HOSPITAL_COMMUNITY): Payer: Self-pay | Admitting: Cardiology

## 2021-12-09 ENCOUNTER — Encounter (HOSPITAL_COMMUNITY)
Admission: RE | Admit: 2021-12-09 | Discharge: 2021-12-09 | Disposition: A | Discharge status: Home / Self Care | Payer: BC Managed Care – PPO | Source: Ambulatory Visit | Attending: Cardiology | Admitting: Cardiology

## 2021-12-09 ENCOUNTER — Other Ambulatory Visit (HOSPITAL_COMMUNITY): Payer: Self-pay

## 2021-12-09 DIAGNOSIS — I252 Old myocardial infarction: Secondary | ICD-10-CM | POA: Insufficient documentation

## 2021-12-09 DIAGNOSIS — Z955 Presence of coronary angioplasty implant and graft: Secondary | ICD-10-CM | POA: Diagnosis present

## 2021-12-09 DIAGNOSIS — I213 ST elevation (STEMI) myocardial infarction of unspecified site: Secondary | ICD-10-CM

## 2021-12-11 NOTE — Progress Notes (Signed)
? ?ADVANCED HF CLINIC NOTE ? ?Primary Care: Deland Pretty, MD ?Nephrology: Dr. Justin Mend ?HF Cardiologist: Dr. Aundra Dubin ? ?HPI: ?Bonnie Nelson is a 56 y.o. female w/ new systolic heart failure/ischemic CM.  ?  ?In 10/2020, she was admitted w/ acute pericarditis and large pericardial effusion w/ tamponade, requiring emergent pericardiocentesis. LVEF at the time was hyperdynamic, 65-70%. Pericardial fluid showed elevated protein and high normal glucose levels. It was negative for cultures and AFB stain and positive for reactive mesothelial cells. She was treated w/ high dose ASA and colchicine. Also noted to have brief occurrence of Afib w/ RVR during admission but spontaneously converted to NSR. No indication for anticoagulation (CHA2DS2VASc of 1).  ?  ?Readmitted 12/22 w/ chest pain and ruled in for NSTEMI.  HS-troponin I peaked at 4263 ng. 2D echo showed newly reduced LVEF,40-45%. RV normal. No pericardial effusion.  ?  ?Subsequent LHC showed severe 2 vessel CAD,95 to 99% pLAD treated w/ PCI + DES, as well as 95 to 99% LCx lesion also treated w/ PCI +DES. LFEV estimated to be lower by LVG at ~25%. Placed on DAPT w/ ASA + Brilinta, high intensity statin + GDMT for HF w/ Entresto, Jardiance and Toprol XL. Referred to [facility] clinic.  ?  ?Seen in TOC, GDMT titrated and referred to [facility] to establish care for CAD with general cardiology.  ? ?Sleep study in 2/23 showed no OSA.  ? ?Echo was done today and reviewed, EF up to 60-65%, normal RV, no pericardial effusion.  ? ?Today she returns for HF follow up. She has been doing well.  Weight down 1 lb.  No chest pain.  She continues cardiac rehab.  No exertional dyspnea.  She had an episode of diverticulitis with abdominal pain about a week ago, now resolved with Augmentin use.   ? ?ECG (personally reviewed): NSR, nonspecific T wave flattening ? ?Labs (4/23): K 3.6, creatinine 1.15, hgb 13.7 ? ?Cardiac Testing  ?- Echo (12/22): EF 40-45%, mildly decreased function, mild LVH,  grade I DD, severe HK of LV/mid apical septal wall and anterior wall, normal RV, mild to moderate MR, no pericardial effusion.  ?- Echo (5/23): EF up to 60-65%, normal RV, no pericardial effusion.  ? ?  ?- LHC (12/22): ?LV: 128/2, EDP 30 mmHg.  Ao 125/71, mean 96 mmHg.  No pressure gradient across the aortic valve. ?LVEF, global hypokinesis, anterolateral hypokinesis.  Hand contrast injection only performed due to elevated EDP. ?RCA: Nondominant, small and normal. ?LM: Nonexistent. ?LAD: Separate ostia.  Proximal LAD has a smooth 95 to 99% stenosis with TIMI II flow. ?Successful direct stenting with a 3.5 x 18 mm Onyx frontier DES, stenosis reduced to 0% with TIMI II to TIMI-3 flow improvement. ?LCx: Dominant.  Large-caliber vessel.  Gives origin to large OM1, small OM 2 and OM 3 is large.  Has a focal 95% to 99% stenosis.  Direct stenting with 3.5 x 16 mm Synergy XD DES, stenosis reduced to 0% with TIMI-3 to TIMI-3 flow. ?  ? ?Past Medical History:  ?Diagnosis Date  ? Anxiety   ? Arthritis   ? Asthma   ? CHF (congestive heart failure) ([place])   ? Coronary artery disease   ? Myocardial infarct [facility])   ? Pericarditis   ? Pneumonia   ? ?Current Outpatient Medications  ?Medication Sig Dispense Refill  ? acetaminophen (TYLENOL) 500 MG tablet Take 1,000 mg by mouth every 6 (six) hours as needed for mild pain or moderate pain.    ?  ALPRAZolam (XANAX) 0.25 MG tablet Take 1 tablet by mouth 3 times a day as neeed for anxiety 90 tablet 1  ? amoxicillin-clavulanate (AUGMENTIN) 875-125 MG tablet Take 1 tablet by mouth twice daily for 10 days. 20 tablet 0  ? aspirin EC 81 MG tablet Take 81 mg by mouth every evening.    ? atorvastatin (LIPITOR) 80 MG tablet Take 1 tablet (80 mg total) by mouth daily. 30 tablet 6  ? Azelastine-Fluticasone 137-50 MCG/ACT SUSP Use 1 spray in each nostril twice a day 23 g 5  ? desloratadine (CLARINEX) 5 MG tablet Take 1 tablet by mouth daily (Patient taking differently: Take 5 mg by mouth every  evening.) 30 tablet 5  ? empagliflozin (JARDIANCE) 10 MG TABS tablet Take 1 tablet (10 mg total) by mouth daily. 30 tablet 6  ? fexofenadine (ALLEGRA) 180 MG tablet Take 180 mg by mouth in the morning.    ? Fluticasone-Umeclidin-Vilant (TRELEGY ELLIPTA) 200-62.5-25 MCG/ACT AEPB Inhale 1 puff into the lungs once a day. 60 each 5  ? ipratropium (ATROVENT) 0.06 % nasal spray Place 2 sprays into both nostrils daily as needed for allergies.    ? levalbuterol (XOPENEX HFA) 45 MCG/ACT inhaler Inhale 2 puffs into the lungs every 4-6 hours as needed for cough/wheeze. 15 g 0  ? metoprolol succinate (TOPROL-XL) 50 MG 24 hr tablet Take 1 tablet (50 mg total) by mouth daily. Take with or immediately following a meal. 30 tablet 6  ? montelukast (SINGULAIR) 10 MG tablet Take 1 tablet (10 mg total) by mouth daily. 90 tablet 3  ? nitroGLYCERIN (NITROSTAT) 0.4 MG SL tablet Place 1 tablet (0.4 mg total) under the tongue every 5 (five) minutes x 3 doses as needed for chest pain. 25 tablet 1  ? pantoprazole (PROTONIX) 40 MG tablet Take 1 tablet by mouth daily 30 tablet 6  ? sacubitril-valsartan (ENTRESTO) 24-26 MG Take 1 tablet by mouth 2 (two) times daily. 60 tablet 6  ? Semaglutide-Weight Management (WEGOVY) 0.5 MG/0.5ML SOAJ Inject 1 pen (0.5 MG) into the skin once a week 2 mL 0  ? Semaglutide-Weight Management (WEGOVY) 1 MG/0.5ML SOAJ Inject 1 pen (1 MG) into the skin once a week 2 mL 0  ? sertraline (ZOLOFT) 100 MG tablet TAKE 1 AND 1/2 TABLETS BY MOUTH EVERY DAY 135 tablet 1  ? spironolactone (ALDACTONE) 25 MG tablet Take 1/2 tablet by mouth daily. 90 tablet 3  ? ticagrelor (BRILINTA) 90 MG TABS tablet Take 1 tablet (90 mg total) by mouth 2 (two) times daily. 60 tablet 6  ? ?No current facility-administered medications for this encounter.  ? ?No Known Allergies ? ?Social History  ? ?Socioeconomic History  ? Marital status: Married  ?  Spouse name: Donnah Scicchitano  ? Number of children: 2  ? Years of education: 33  ? Highest education  level: Master's degree (e.g., MA, MS, MEng, MEd, MSW, MBA)  ?Occupational History  ? Occupation: middle school Music therapist  ?  Comment: [employer]  ?Tobacco Use  ? Smoking status: Never  ? Smokeless tobacco: Never  ?Vaping Use  ? Vaping Use: Never used  ?Substance and Sexual Activity  ? Alcohol use: Not Currently  ?  Alcohol/week: 3.0 standard drinks  ?  Types: 3 Glasses of wine per week  ?  Comment: OCCASIONAL  ? Drug use: Never  ? Sexual activity: Yes  ?Other Topics Concern  ? Not on file  ?Social History Narrative  ? Not on file  ? ?  Social Determinants of Health  ? ?Financial Resource Strain: Low Risk   ? Difficulty of Paying Living Expenses: Not hard at all  ?Food Insecurity: No Food Insecurity  ? Worried About Charity fundraiser in the Last Year: Never true  ? Ran Out of Food in the Last Year: Never true  ?Transportation Needs: No Transportation Needs  ? Lack of Transportation (Medical): No  ? Lack of Transportation (Non-Medical): No  ?Physical Activity: Not on file  ?Stress: Not on file  ?Social Connections: Not on file  ?Intimate Partner Violence: Not on file  ? ?Family History  ?Problem Relation Age of Onset  ? Hypertension Father   ? ?BP 130/70   Pulse 80   Wt 84.8 kg (187 lb)   SpO2 99%   BMI 30.18 kg/m?  ? ?Wt Readings from Last 3 Encounters:  ?12/08/21 84.8 kg (187 lb)  ?11/17/21 86.5 kg (190 lb 11.2 oz)  ?10/11/21 86.6 kg (191 lb)  ? ?PHYSICAL EXAM: ?General: NAD ?Neck: No JVD, no thyromegaly or thyroid nodule.  ?Lungs: Clear to auscultation bilaterally with normal respiratory effort. ?CV: Nondisplaced PMI.  Heart regular S1/S2, no S3/S4, no murmur.  No peripheral edema.  No carotid bruit.  Normal pedal pulses.  ?Abdomen: Soft, nontender, no hepatosplenomegaly, no distention.  ?Skin: Intact without lesions or rashes.  ?Neurologic: Alert and oriented x 3.  ?Psych: Normal affect. ?Extremities: No clubbing or cyanosis.  ?HEENT: Normal.  ? ?ASSESSMENT & PLAN: ?Chronic Systolic Heart Failure,  iCM. ?- Ischemic CM, in setting of high grade left system CAD as outlined below, now s/p PCI to LAD and LCx ?- Echo (12/22): EF down to 40-45% (noted to be ~25% by LVG at time of cath, previously 65%). RV normal

## 2021-12-12 ENCOUNTER — Encounter (HOSPITAL_COMMUNITY)
Admission: RE | Admit: 2021-12-12 | Discharge: 2021-12-12 | Disposition: A | Discharge status: Home / Self Care | Payer: BC Managed Care – PPO | Source: Ambulatory Visit | Attending: Cardiology | Admitting: Cardiology

## 2021-12-12 DIAGNOSIS — Z955 Presence of coronary angioplasty implant and graft: Secondary | ICD-10-CM

## 2021-12-12 DIAGNOSIS — I213 ST elevation (STEMI) myocardial infarction of unspecified site: Secondary | ICD-10-CM

## 2021-12-12 DIAGNOSIS — I252 Old myocardial infarction: Secondary | ICD-10-CM | POA: Diagnosis not present

## 2021-12-14 ENCOUNTER — Encounter (HOSPITAL_COMMUNITY)
Admission: RE | Admit: 2021-12-14 | Discharge: 2021-12-14 | Disposition: A | Discharge status: Home / Self Care | Payer: BC Managed Care – PPO | Source: Ambulatory Visit | Attending: Cardiology | Admitting: Cardiology

## 2021-12-14 DIAGNOSIS — Z955 Presence of coronary angioplasty implant and graft: Secondary | ICD-10-CM

## 2021-12-14 DIAGNOSIS — I213 ST elevation (STEMI) myocardial infarction of unspecified site: Secondary | ICD-10-CM

## 2021-12-14 DIAGNOSIS — I252 Old myocardial infarction: Secondary | ICD-10-CM | POA: Diagnosis not present

## 2021-12-14 NOTE — Progress Notes (Signed)
Cardiac Individual Treatment Plan ? ?Patient Details  ?Name: Bonnie Nelson ?MRN: [id number] ?Date of Birth: 03-Oct-1965 ?Referring Provider:   ?Flowsheet Row CARDIAC REHAB PHASE II ORIENTATION from 10/11/2021 in [facility]  ?Referring Provider Larey Dresser, MD  ? ?  ? ? ?Initial Encounter Date:  ?Flowsheet Row CARDIAC REHAB PHASE II ORIENTATION from 10/11/2021 in [facility]  ?Date 10/11/21  ? ?  ? ? ?Visit Diagnosis: 07/20/21 STEMI ? ?07/20/21 S/P DES x 2 LAD ? ?Patient's Home Medications on Admission: ? ?Current Outpatient Medications:  ?  acetaminophen (TYLENOL) 500 MG tablet, Take 1,000 mg by mouth every 6 (six) hours as needed for mild pain or moderate pain., Disp: , Rfl:  ?  ALPRAZolam (XANAX) 0.25 MG tablet, Take 1 tablet by mouth 3 times a day as neeed for anxiety, Disp: 90 tablet, Rfl: 1 ?  aspirin EC 81 MG tablet, Take 81 mg by mouth every evening., Disp: , Rfl:  ?  atorvastatin (LIPITOR) 80 MG tablet, Take 1 tablet (80 mg total) by mouth daily., Disp: 30 tablet, Rfl: 6 ?  Azelastine-Fluticasone 137-50 MCG/ACT SUSP, Use 1 spray in each nostril twice a day, Disp: 23 g, Rfl: 5 ?  desloratadine (CLARINEX) 5 MG tablet, Take 1 tablet by mouth daily (Patient taking differently: Take 5 mg by mouth every evening.), Disp: 30 tablet, Rfl: 5 ?  empagliflozin (JARDIANCE) 10 MG TABS tablet, Take 1 tablet (10 mg total) by mouth daily., Disp: 30 tablet, Rfl: 6 ?  fexofenadine (ALLEGRA) 180 MG tablet, Take 180 mg by mouth in the morning., Disp: , Rfl:  ?  Fluticasone-Umeclidin-Vilant (TRELEGY ELLIPTA) 200-62.5-25 MCG/ACT AEPB, Inhale 1 puff into the lungs once a day., Disp: 60 each, Rfl: 5 ?  ipratropium (ATROVENT) 0.06 % nasal spray, Place 2 sprays into both nostrils daily as needed for allergies., Disp: , Rfl:  ?  levalbuterol (XOPENEX HFA) 45 MCG/ACT inhaler, Inhale 2 puffs into the lungs every 4-6 hours as needed for cough/wheeze., Disp: 15 g, Rfl: 0 ?   metoprolol succinate (TOPROL-XL) 50 MG 24 hr tablet, Take 1 tablet (50 mg total) by mouth daily. Take with or immediately following a meal., Disp: 30 tablet, Rfl: 6 ?  montelukast (SINGULAIR) 10 MG tablet, Take 1 tablet (10 mg total) by mouth daily., Disp: 90 tablet, Rfl: 3 ?  nitroGLYCERIN (NITROSTAT) 0.4 MG SL tablet, Place 1 tablet (0.4 mg total) under the tongue every 5 (five) minutes x 3 doses as needed for chest pain., Disp: 25 tablet, Rfl: 1 ?  pantoprazole (PROTONIX) 40 MG tablet, Take 1 tablet by mouth daily, Disp: 30 tablet, Rfl: 6 ?  sacubitril-valsartan (ENTRESTO) 24-26 MG, Take 1 tablet by mouth 2 (two) times daily., Disp: 60 tablet, Rfl: 6 ?  sertraline (ZOLOFT) 100 MG tablet, TAKE 1 AND 1/2 TABLETS BY MOUTH EVERY DAY, Disp: 135 tablet, Rfl: 1 ?  spironolactone (ALDACTONE) 25 MG tablet, Take 1/2 tablet by mouth daily., Disp: 90 tablet, Rfl: 3 ?  ticagrelor (BRILINTA) 90 MG TABS tablet, Take 1 tablet (90 mg total) by mouth 2 (two) times daily., Disp: 60 tablet, Rfl: 6 ?  Semaglutide-Weight Management (WEGOVY) 0.5 MG/0.5ML SOAJ, Inject 1 pen (0.5 MG) into the skin once a week (Patient not taking: Reported on 12/14/2021), Disp: 2 mL, Rfl: 0 ?  Semaglutide-Weight Management (WEGOVY) 1 MG/0.5ML SOAJ, Inject 1 pen (1 MG) into the skin once a week (Patient not taking: Reported on 12/14/2021), Disp: 2  mL, Rfl: 0 ? ?Past Medical History: ?Past Medical History:  ?Diagnosis Date  ? Anxiety   ? Arthritis   ? Asthma   ? CHF (congestive heart failure) ([place])   ? Coronary artery disease   ? Myocardial infarct [facility])   ? Pericarditis   ? Pneumonia   ? ? ?Tobacco Use: ?Social History  ? ?Tobacco Use  ?Smoking Status Never  ?Smokeless Tobacco Never  ? ? ?Labs: ?Review Flowsheet   ? ?  ?  Latest Ref Rng & Units 09/14/2019 09/15/2019 07/20/2021 12/08/2021  ?Labs for ITP Cardiac and Pulmonary Rehab  ?Cholestrol 0 - 200 mg/dL  125   148    ? 165   97    ?LDL (calc) 0 - 99 mg/dL  62   97    ? 96   42    ?HDL-C >40 mg/dL  48   45    ?  51   38    ?Trlycerides <150 mg/dL  74   31    ? 89   87    ?Hemoglobin A1c 4.8 - 5.6 % 5.6    5.6     ?  ? ? Multiple values from one day are sorted in reverse-chronological order  ?  ?  ? ? ?Capillary Blood Glucose: ?Lab Results  ?Component Value Date  ? GLUCAP 117 (H) 07/20/2021  ? ? ? ?Exercise Target Goals: ?Exercise Program Goal: ?Individual exercise prescription set using results from initial 6 min walk test and THRR while considering  patient?s activity barriers and safety.  ? ?Exercise Prescription Goal: ?Starting with aerobic activity 30 plus minutes a day, 3 days per week for initial exercise prescription. Provide home exercise prescription and guidelines that participant acknowledges understanding prior to discharge. ? ?Activity Barriers & Risk Stratification: ? Activity Barriers & Cardiac Risk Stratification - 10/11/21 1441   ? ?  ? Activity Barriers & Cardiac Risk Stratification  ? Activity Barriers Arthritis;Other (comment)   ? Comments Torn meniscus x2 right knee w/ surgery August 2022. Arthritis both knees.   ? Cardiac Risk Stratification High   ? ?  ?  ? ?  ? ? ?6 Minute Walk: ? 6 Minute Walk   ? ? [place] Name 10/11/21 1356 12/12/21 1654  ?  ?  ? 6 Minute Walk  ? Phase Initial Discharge   ? Distance 2109 feet 2050 feet   ? Distance % Change -- -59 %   ? Distance Feet Change -- -2.8 ft   ? Walk Time 6 minutes 6 minutes   ? # of Rest Breaks 0 0   ? MPH 3.99 3.88   ? METS 4.97 4.88   ? RPE 12 11   ? Perceived Dyspnea  0 0   ? VO2 Peak 17.41 17.09   ? Symptoms No No   ? Resting HR 72 bpm 84 bpm   ? Resting BP 122/68 118/62   ? Resting Oxygen Saturation  97 % 96 %   ? Exercise Oxygen Saturation  during 6 min walk 97 % 97 %   ? Max Ex. HR 105 bpm 120 bpm   ? Max Ex. BP 146/56 124/60   ? 2 Minute Post BP 138/74 110/60   ? ?  ?  ? ?  ? ? ?Oxygen Initial Assessment: ? ? ?Oxygen Re-Evaluation: ? ? ?Oxygen Discharge (Final Oxygen Re-Evaluation): ? ? ?Initial Exercise Prescription: ? Initial Exercise Prescription  - 10/11/21 1400   ? ?  ?  Date of Initial Exercise RX and Referring Provider  ? Date 10/11/21   ? Referring Provider Larey Dresser, MD   ? Expected Discharge Date 12/09/21   ?  ? Treadmill  ? MPH 2.5   ? Grade 0   ? Minutes 15   ? METs 2.91   ?  ? NuStep  ? Level 2   ? SPM 85   ? Minutes 15   ? METs 2.5   ?  ? Prescription Details  ? Frequency (times per week) 3   ? Duration Progress to 30 minutes of continuous aerobic without signs/symptoms of physical distress   ?  ? Intensity  ? THRR 40-80% of Max Heartrate 65-131   ? Ratings of Perceived Exertion 11-13   ? Perceived Dyspnea 0-4   ?  ? Progression  ? Progression Continue to progress workloads to maintain intensity without signs/symptoms of physical distress.   ?  ? Resistance Training  ? Training Prescription Yes   ? Weight 3 lbs   ? Reps 10-15   ? ?  ?  ? ?  ? ? ?Perform Capillary Blood Glucose checks as needed. ? ?Exercise Prescription Changes: ? ? Exercise Prescription Changes   ? ? [place] Name 10/17/21 1600 10/28/21 1631 11/30/21 1641  ?  ?  ?  ? Response to Exercise  ? Blood Pressure (Admit) 108/78 124/68 110/66    ? Blood Pressure (Exercise) 118/68 142/64 144/68    ? Blood Pressure (Exit) 102/60 104/70 102/70    ? Heart Rate (Admit) 83 bpm 85 bpm 77 bpm    ? Heart Rate (Exercise) 114 bpm 112 bpm 126 bpm    ? Heart Rate (Exit) 78 bpm 84 bpm 86 bpm    ? Rating of Perceived Exertion (Exercise) 11 11 11.5    ? Perceived Dyspnea (Exercise) 0 0 0    ? Symptoms 0 0 0    ? Comments Pt first day in the CRP2 program Reviewed MET's, goals and home ExRx Reviewed MET's and goals    ? Duration Progress to 30 minutes of  aerobic without signs/symptoms of physical distress Progress to 30 minutes of  aerobic without signs/symptoms of physical distress Progress to 30 minutes of  aerobic without signs/symptoms of physical distress    ? Intensity THRR unchanged THRR unchanged THRR unchanged    ?  ? Progression  ? Progression Continue to progress workloads to maintain intensity  without signs/symptoms of physical distress. Continue to progress workloads to maintain intensity without signs/symptoms of physical distress. Continue to progress workloads to maintain intensity without

## 2021-12-16 ENCOUNTER — Encounter (HOSPITAL_COMMUNITY)
Admission: RE | Admit: 2021-12-16 | Discharge: 2021-12-16 | Disposition: A | Discharge status: Home / Self Care | Payer: BC Managed Care – PPO | Source: Ambulatory Visit | Attending: Cardiology | Admitting: Cardiology

## 2021-12-16 ENCOUNTER — Other Ambulatory Visit (HOSPITAL_COMMUNITY): Payer: Self-pay

## 2021-12-16 DIAGNOSIS — Z955 Presence of coronary angioplasty implant and graft: Secondary | ICD-10-CM

## 2021-12-16 DIAGNOSIS — I213 ST elevation (STEMI) myocardial infarction of unspecified site: Secondary | ICD-10-CM

## 2021-12-16 DIAGNOSIS — I252 Old myocardial infarction: Secondary | ICD-10-CM | POA: Diagnosis not present

## 2021-12-16 NOTE — Progress Notes (Signed)
Discharge Progress Report  Patient Details  Name: Bonnie Nelson MRN: [id number] Date of Birth: 1966-01-24 Referring Provider:   Flowsheet Row CARDIAC REHAB PHASE II ORIENTATION from 10/11/2021 in [place]  Referring Provider Larey Dresser, MD        Number of Visits: 23  Reason for Discharge:  Patient reached a stable level of exercise. Patient independent in their exercise. Patient has met program and personal goals.  Smoking History:  Social History   Tobacco Use  Smoking Status Never  Smokeless Tobacco Never    Diagnosis:  07/20/21 STEMI  07/20/21 S/P DES x 2 LAD  ADL [facility]:   Initial Exercise Prescription:  Initial Exercise Prescription - 10/11/21 1400       Date of Initial Exercise RX and Referring Provider   Date 10/11/21    Referring Provider Larey Dresser, MD    Expected Discharge Date 12/09/21      Treadmill   MPH 2.5    Grade 0    Minutes 15    METs 2.91      NuStep   Level 2    SPM 85    Minutes 15    METs 2.5      Prescription Details   Frequency (times per week) 3    Duration Progress to 30 minutes of continuous aerobic without signs/symptoms of physical distress      Intensity   THRR 40-80% of Max Heartrate 65-131    Ratings of Perceived Exertion 11-13    Perceived Dyspnea 0-4      Progression   Progression Continue to progress workloads to maintain intensity without signs/symptoms of physical distress.      Resistance Training   Training Prescription Yes    Weight 3 lbs    Reps 10-15             Discharge Exercise Prescription (Final Exercise Prescription Changes):  Exercise Prescription Changes - 12/16/21 1628       Response to Exercise   Blood Pressure (Admit) 108/60    Blood Pressure (Exercise) 140/64    Blood Pressure (Exit) 100/66    Heart Rate (Admit) 88 bpm    Heart Rate (Exercise) 130 bpm    Heart Rate (Exit) 92 bpm    Rating of Perceived Exertion (Exercise) 11     Perceived Dyspnea (Exercise) 0    Symptoms 0    Comments Pt graduated the CRP2 program    Duration Progress to 30 minutes of  aerobic without signs/symptoms of physical distress    Intensity THRR unchanged      Progression   Progression Continue to progress workloads to maintain intensity without signs/symptoms of physical distress.    Average METs 3.91      Resistance Training   Training Prescription Yes    Weight 4 lbs wts    Reps 10-15    Time 10 Minutes      Treadmill   MPH 3.4    Grade 1.5    Minutes 15    METs 4.31      NuStep   Level 5    SPM 100    Minutes 15    METs 3.5      Home Exercise Plan   Plans to continue exercise at Home (comment)    Frequency Add 3 additional days to program exercise sessions.    Initial Home Exercises Provided 10/28/21  Functional Capacity:  6 Minute Walk     Row Name 10/11/21 1356 12/12/21 1654       6 Minute Walk   Phase Initial Discharge    Distance 2109 feet 2050 feet    Distance % Change -- -59 %    Distance Feet Change -- -2.8 ft    Walk Time 6 minutes 6 minutes    # of Rest Breaks 0 0    MPH 3.99 3.88    METS 4.97 4.88    RPE 12 11    Perceived Dyspnea  0 0    VO2 Peak 17.41 17.09    Symptoms No No    Resting HR 72 bpm 84 bpm    Resting BP 122/68 118/62    Resting Oxygen Saturation  97 % 96 %    Exercise Oxygen Saturation  during 6 min walk 97 % 97 %    Max Ex. HR 105 bpm 120 bpm    Max Ex. BP 146/56 124/60    2 Minute Post BP 138/74 110/60             Psychological, QOL, Others - Outcomes: PHQ 2/9:    12/14/2021    4:11 PM 10/11/2021    3:07 PM  Depression screen PHQ 2/9  Decreased Interest 0 0  Down, Depressed, Hopeless 0 0  PHQ - 2 Score 0 0    Quality of Life:  Quality of Life - 10/11/21 1436       Quality of Life   Select Quality of Life      Quality of Life Scores   Health/Function Pre 25.5 %    Socioeconomic Pre 30 %    Psych/Spiritual Pre 26.36 %    Family Pre  22.8 %    GLOBAL Pre 26.21 %             Personal Goals: Goals established at orientation with interventions provided to work toward goal.  Personal Goals and Risk Factors at Admission - 10/11/21 1437       Core Components/Risk Factors/Patient Goals on Admission    Weight Management Yes;Obesity;Weight Loss    Intervention Weight Management/Obesity: Establish reasonable short term and long term weight goals.;Obesity: Provide education and appropriate resources to help participant work on and attain dietary goals.    Admit Weight 191 lb 9.3 oz (86.9 kg)    Goal Weight: Short Term 187 lb (84.8 kg)    Goal Weight: Long Term 180 lb (81.6 kg)    Expected Outcomes Short Term: Continue to assess and modify interventions until short term weight is achieved;Long Term: Adherence to nutrition and physical activity/exercise program aimed toward attainment of established weight goal;Weight Loss: Understanding of general recommendations for a balanced deficit meal plan, which promotes 1-2 lb weight loss per week and includes a negative energy balance of [phone] kcal/d    Heart Failure Yes    Intervention Provide a combined exercise and nutrition program that is supplemented with education, support and counseling about heart failure. Directed toward relieving symptoms such as shortness of breath, decreased exercise tolerance, and extremity edema.    Expected Outcomes Improve functional capacity of life;Short term: Daily weights obtained and reported for increase. Utilizing diuretic protocols set by physician.;Long term: Adoption of self-care skills and reduction of barriers for early signs and symptoms recognition and intervention leading to self-care maintenance.;Short term: Attendance in program 2-3 days a week with increased exercise capacity. Reported lower sodium intake. Reported increased fruit and vegetable intake.  Reports medication compliance.    Hypertension Yes    Intervention Provide education  on lifestyle modifcations including regular physical activity/exercise, weight management, moderate sodium restriction and increased consumption of fresh fruit, vegetables, and low fat dairy, alcohol moderation, and smoking cessation.;Monitor prescription use compliance.    Expected Outcomes Short Term: Continued assessment and intervention until BP is < 140/69mm HG in hypertensive participants. < 130/44mm HG in hypertensive participants with diabetes, heart failure or chronic kidney disease.;Long Term: Maintenance of blood pressure at goal levels.    Stress Yes    Intervention Offer individual and/or small group education and counseling on adjustment to heart disease, stress management and health-related lifestyle change. Teach and support self-help strategies.;Refer participants experiencing significant psychosocial distress to appropriate mental health specialists for further evaluation and treatment. When possible, include family members and significant others in education/counseling sessions.    Expected Outcomes Short Term: Participant demonstrates changes in health-related behavior, relaxation and other stress management skills, ability to obtain effective social support, and compliance with psychotropic medications if prescribed.;Long Term: Emotional wellbeing is indicated by absence of clinically significant psychosocial distress or social isolation.              Personal Goals Discharge:  Goals and Risk Factor Review     Row Name 10/17/21 1700 11/16/21 0958 12/14/21 1715         Core Components/Risk Factors/Patient Goals Review   Personal Goals Review Weight Management/Obesity;Heart Failure;Hypertension;Stress;Lipids Weight Management/Obesity;Heart Failure;Hypertension;Stress;Lipids Weight Management/Obesity;Heart Failure;Hypertension;Stress;Lipids     Review Chamika started cardiac rehab on 10/17/21. Geniyah did well with exercise. Vital sign were stable Maripaz has been doing well with exercise.  vital signs have been stable. [place] is enjoying group exercise. Caitlynn has been doing well with exercise. vital signs have been stable. Akyia is enjoying group exercise. Willean has lost 1.9 kg and will complete phase 2 cardiac rehab on 12/14/21     Expected Outcomes Dalila will continue to participate in phase 2 cardiac rehab for exercise, nutrition and lifestyle modificatoins Loraina will continue to participate in phase 2 cardiac rehab for exercise, nutrition and lifestyle modificatoins Mattalynn will continue to exercise, follw  nutrition and lifestyle modificatoins upon completion of phase 2 cardiac rehab              Exercise Goals and Review:  Exercise Goals     Row Name 10/11/21 1320             Exercise Goals   Increase Physical Activity Yes       Intervention Develop an individualized exercise prescription for aerobic and resistive training based on initial evaluation findings, risk stratification, comorbidities and participant's personal goals.;Provide advice, education, support and counseling about physical activity/exercise needs.       Expected Outcomes Short Term: Attend rehab on a regular basis to increase amount of physical activity.;Long Term: Exercising regularly at least 3-5 days a week.;Long Term: Add in home exercise to make exercise part of routine and to increase amount of physical activity.       Increase Strength and Stamina Yes       Intervention Provide advice, education, support and counseling about physical activity/exercise needs.;Develop an individualized exercise prescription for aerobic and resistive training based on initial evaluation findings, risk stratification, comorbidities and participant's personal goals.       Expected Outcomes Short Term: Increase workloads from initial exercise prescription for resistance, speed, and METs.;Short Term: Perform resistance training exercises routinely during rehab and add in resistance training at home;Long  Term: Improve cardiorespiratory  fitness, muscular endurance and strength as measured by increased METs and functional capacity (6MWT)       Able to understand and use rate of perceived exertion (RPE) scale Yes       Intervention Provide education and explanation on how to use RPE scale       Expected Outcomes Short Term: Able to use RPE daily in rehab to express subjective intensity level;Long Term:  Able to use RPE to guide intensity level when exercising independently       Knowledge and understanding of Target Heart Rate Range (THRR) Yes       Intervention Provide education and explanation of THRR including how the numbers were predicted and where they are located for reference       Expected Outcomes Short Term: Able to state/look up THRR;Long Term: Able to use THRR to govern intensity when exercising independently;Short Term: Able to use daily as guideline for intensity in rehab       Able to check pulse independently Yes       Intervention Provide education and demonstration on how to check pulse in carotid and radial arteries.;Review the importance of being able to check your own pulse for safety during independent exercise       Expected Outcomes Short Term: Able to explain why pulse checking is important during independent exercise;Long Term: Able to check pulse independently and accurately       Understanding of Exercise Prescription Yes       Intervention Provide education, explanation, and written materials on patient's individual exercise prescription       Expected Outcomes Short Term: Able to explain program exercise prescription;Long Term: Able to explain home exercise prescription to exercise independently                Exercise Goals Re-Evaluation:  Exercise Goals Re-Evaluation     Row Name 10/17/21 1642 10/28/21 1634 11/30/21 1643 12/16/21 1631       Exercise Goal Re-Evaluation   Exercise Goals Review Increase Physical Activity;Increase Strength and Stamina;Able to understand and use rate of perceived  exertion (RPE) scale;Knowledge and understanding of Target Heart Rate Range (THRR);Understanding of Exercise Prescription Increase Physical Activity;Increase Strength and Stamina;Able to understand and use rate of perceived exertion (RPE) scale;Knowledge and understanding of Target Heart Rate Range (THRR);Understanding of Exercise Prescription Increase Physical Activity;Increase Strength and Stamina;Able to understand and use rate of perceived exertion (RPE) scale;Knowledge and understanding of Target Heart Rate Range (THRR);Understanding of Exercise Prescription Increase Physical Activity;Increase Strength and Stamina;Able to understand and use rate of perceived exertion (RPE) scale;Knowledge and understanding of Target Heart Rate Range (THRR);Understanding of Exercise Prescription    Comments Pt first day in the CRP2 program. Pt tolerated exercise very well and states she is happy to be back in exercise. Average MET level was 2.67. Pt is learning her THRR, RPE and ExRx. Reviewed MET's, goals and Home ExRx. Pt tolerated exercise very well and has an Average MET level was 3.15. Pt feels great about her goals of gaining strength, endurance, wt loss and feeling better about her health. She also wanted to know her limits to exercise which we discussed today. Pt is gainging strength and confidence and will continue to exercise at the [facility] and by walking 2-4 days a week for 30-45 mins per session Reviewed MET's and goals. Pt tolerated exercise very well and has an Average MET level was 4.31. Pt feels like she is doing much better, she knows  her limits to exercise, feels healthy, has decreased WT, and is gaining strength. Encouraged her strength though her incease in MET's. Pt doing very well and feels good about her progress. Pt graduated the CRP2 program. Pt tolerated exercise very well and has an Average MET level was 3.91. Pt will continue to exercise on her own by walking and going to the [facility] for 7 days 30-45 mins  per session. Pt is doing very well and feels good about her progress.    Expected Outcomes Will continue to monitor pt and progress workloads as tolerated without sign or symptom Pt will continue to exercise on her own. Will continue to monitor pt and progress workloads as tolerated without sign or symptom Will continue to monitor pt and progress workloads as tolerated without sign or symptom Pt will continue to exercise on her own and gain strength             Nutrition & Weight - Outcomes:  Pre Biometrics - 10/11/21 1319       Pre Biometrics   Waist Circumference 36 inches    Hip Circumference 44.5 inches    Waist to Hip Ratio 0.81 %    Triceps Skinfold 27 mm    % Body Fat 39.6 %    Grip Strength 30 kg    Flexibility 15.63 in    Single Leg Stand 30 seconds              Nutrition:  Nutrition Therapy & Goals - 12/14/21 1550       Nutrition Therapy   Diet Heart Healthy Diet    Drug/Food Interactions Statins/Certain Fruits      Personal Nutrition Goals   Comments Patient continues [facility] for weight loss support.      Intervention Plan   Intervention Prescribe, educate and counsel regarding individualized specific dietary modifications aiming towards targeted core components such as weight, hypertension, lipid management, diabetes, heart failure and other comorbidities.    Expected Outcomes Short Term Goal: Understand basic principles of dietary content, such as calories, fat, sodium, cholesterol and nutrients.;Long Term Goal: Adherence to prescribed nutrition plan.             Nutrition Discharge:  Nutrition Assessments - 11/07/21 1543       Rate Your Plate Scores   Pre Score 74             Education Questionnaire Score:  Knowledge Questionnaire Score - 10/11/21 1437       Knowledge Questionnaire Score   Pre Score 21/24             Goals reviewed with patient; copy given to patient.Pt graduated from cardiac rehab program on 12/16/21 with  completion of 23 exercise sessions in Phase II. Pt maintained good attendance and progressed nicely during her participation in rehab as evidenced by increased MET level.   Medication list reconciled. Repeat  PHQ score- 0 .  Pt has made significant lifestyle changes and should be commended for her success. Pt feels she has achieved her goals during cardiac rehab.   Pt plans to continue exercise by walking with her sister and going to the [facility]. We are proud of Myah's progress and she is too!Harrell Gave RN BSN

## 2021-12-17 ENCOUNTER — Other Ambulatory Visit (HOSPITAL_COMMUNITY): Payer: Self-pay

## 2021-12-19 ENCOUNTER — Other Ambulatory Visit (HOSPITAL_COMMUNITY): Payer: Self-pay

## 2022-01-03 ENCOUNTER — Other Ambulatory Visit (HOSPITAL_BASED_OUTPATIENT_CLINIC_OR_DEPARTMENT_OTHER): Payer: Self-pay

## 2022-01-03 ENCOUNTER — Encounter (HOSPITAL_BASED_OUTPATIENT_CLINIC_OR_DEPARTMENT_OTHER): Payer: Self-pay | Admitting: Pharmacist

## 2022-01-04 ENCOUNTER — Other Ambulatory Visit (HOSPITAL_COMMUNITY): Payer: Self-pay

## 2022-01-04 ENCOUNTER — Other Ambulatory Visit (HOSPITAL_BASED_OUTPATIENT_CLINIC_OR_DEPARTMENT_OTHER): Payer: Self-pay

## 2022-01-04 ENCOUNTER — Ambulatory Visit: Payer: BC Managed Care – PPO | Admitting: Cardiology

## 2022-01-09 ENCOUNTER — Other Ambulatory Visit (HOSPITAL_COMMUNITY): Payer: Self-pay

## 2022-01-09 ENCOUNTER — Other Ambulatory Visit (HOSPITAL_BASED_OUTPATIENT_CLINIC_OR_DEPARTMENT_OTHER): Payer: Self-pay

## 2022-01-11 ENCOUNTER — Other Ambulatory Visit (HOSPITAL_BASED_OUTPATIENT_CLINIC_OR_DEPARTMENT_OTHER): Payer: Self-pay

## 2022-01-16 ENCOUNTER — Other Ambulatory Visit (HOSPITAL_COMMUNITY): Payer: Self-pay

## 2022-01-17 ENCOUNTER — Other Ambulatory Visit (HOSPITAL_COMMUNITY): Payer: Self-pay

## 2022-01-18 ENCOUNTER — Other Ambulatory Visit (HOSPITAL_COMMUNITY): Payer: Self-pay

## 2022-01-19 ENCOUNTER — Other Ambulatory Visit (HOSPITAL_COMMUNITY): Payer: Self-pay

## 2022-01-19 MED ORDER — PREDNISONE 10 MG PO TABS
ORAL_TABLET | ORAL | 0 refills | Status: DC
  Filled 2022-01-19: qty 10, 4d supply, fill #0

## 2022-01-31 ENCOUNTER — Other Ambulatory Visit (HOSPITAL_COMMUNITY): Payer: Self-pay

## 2022-02-03 ENCOUNTER — Other Ambulatory Visit (HOSPITAL_COMMUNITY): Payer: Self-pay

## 2022-02-03 MED ORDER — JARDIANCE 10 MG PO TABS
10.0000 mg | ORAL_TABLET | Freq: Every day | ORAL | 6 refills | Status: DC
  Filled 2022-02-03: qty 30, 30d supply, fill #0
  Filled 2022-03-01: qty 30, 30d supply, fill #1
  Filled 2022-04-03: qty 30, 30d supply, fill #2

## 2022-02-06 ENCOUNTER — Other Ambulatory Visit (HOSPITAL_COMMUNITY): Payer: Self-pay

## 2022-02-06 MED ORDER — WEGOVY 1 MG/0.5ML ~~LOC~~ SOAJ
SUBCUTANEOUS | 2 refills | Status: DC
  Filled 2022-02-06: qty 2, 28d supply, fill #0

## 2022-02-09 ENCOUNTER — Other Ambulatory Visit (HOSPITAL_COMMUNITY): Payer: Self-pay

## 2022-02-15 ENCOUNTER — Other Ambulatory Visit (HOSPITAL_COMMUNITY): Payer: Self-pay

## 2022-02-15 ENCOUNTER — Other Ambulatory Visit (HOSPITAL_COMMUNITY): Payer: Self-pay | Admitting: *Deleted

## 2022-02-15 MED ORDER — ENTRESTO 24-26 MG PO TABS
ORAL_TABLET | ORAL | 1 refills | Status: DC
  Filled 2022-02-15: qty 180, 90d supply, fill #0

## 2022-02-17 ENCOUNTER — Other Ambulatory Visit (HOSPITAL_COMMUNITY): Payer: Self-pay | Admitting: Pharmacist

## 2022-02-17 ENCOUNTER — Other Ambulatory Visit (HOSPITAL_COMMUNITY): Payer: Self-pay

## 2022-02-17 MED ORDER — METOPROLOL SUCCINATE ER 50 MG PO TB24
50.0000 mg | ORAL_TABLET | Freq: Every day | ORAL | 5 refills | Status: DC
  Filled 2022-02-17: qty 30, 30d supply, fill #0

## 2022-02-17 MED ORDER — ENTRESTO 24-26 MG PO TABS
ORAL_TABLET | ORAL | 1 refills | Status: DC
  Filled 2022-02-17: qty 180, 90d supply, fill #0

## 2022-02-17 MED ORDER — TICAGRELOR 90 MG PO TABS
90.0000 mg | ORAL_TABLET | Freq: Two times a day (BID) | ORAL | 5 refills | Status: DC
  Filled 2022-02-17: qty 60, 30d supply, fill #0

## 2022-02-17 MED ORDER — ATORVASTATIN CALCIUM 80 MG PO TABS
80.0000 mg | ORAL_TABLET | Freq: Every day | ORAL | 5 refills | Status: DC
  Filled 2022-02-17: qty 30, 30d supply, fill #0

## 2022-02-22 ENCOUNTER — Other Ambulatory Visit (HOSPITAL_COMMUNITY): Payer: Self-pay

## 2022-02-22 MED ORDER — WEGOVY 1 MG/0.5ML ~~LOC~~ SOAJ
SUBCUTANEOUS | 3 refills | Status: DC
  Filled 2022-02-22: qty 2, 28d supply, fill #0
  Filled 2022-04-03: qty 2, 28d supply, fill #1
  Filled 2022-05-08: qty 2, 28d supply, fill #2

## 2022-02-22 MED ORDER — ATORVASTATIN CALCIUM 80 MG PO TABS
ORAL_TABLET | ORAL | 6 refills | Status: DC
  Filled 2022-02-22: qty 30, 30d supply, fill #0

## 2022-02-22 MED ORDER — BRILINTA 90 MG PO TABS
ORAL_TABLET | ORAL | 6 refills | Status: DC
  Filled 2022-02-22: qty 60, 30d supply, fill #0

## 2022-02-22 MED ORDER — METOPROLOL SUCCINATE ER 50 MG PO TB24
ORAL_TABLET | ORAL | 6 refills | Status: DC
  Filled 2022-02-22: qty 30, 30d supply, fill #0

## 2022-02-23 ENCOUNTER — Other Ambulatory Visit (HOSPITAL_COMMUNITY): Payer: Self-pay

## 2022-02-23 MED ORDER — TRELEGY ELLIPTA 200-62.5-25 MCG/ACT IN AEPB
INHALATION_SPRAY | RESPIRATORY_TRACT | 3 refills | Status: DC
  Filled 2022-02-23: qty 60, 30d supply, fill #0
  Filled 2022-03-22: qty 60, 30d supply, fill #1
  Filled 2022-04-18: qty 60, 30d supply, fill #2
  Filled 2022-05-21: qty 60, 30d supply, fill #3

## 2022-02-24 ENCOUNTER — Other Ambulatory Visit (HOSPITAL_COMMUNITY): Payer: Self-pay

## 2022-02-25 ENCOUNTER — Other Ambulatory Visit (HOSPITAL_COMMUNITY): Payer: Self-pay

## 2022-02-27 ENCOUNTER — Other Ambulatory Visit (HOSPITAL_COMMUNITY): Payer: Self-pay

## 2022-03-01 ENCOUNTER — Other Ambulatory Visit (HOSPITAL_COMMUNITY): Payer: Self-pay

## 2022-03-06 ENCOUNTER — Other Ambulatory Visit (HOSPITAL_COMMUNITY): Payer: Self-pay

## 2022-03-06 MED ORDER — AMOXICILLIN-POT CLAVULANATE 500-125 MG PO TABS
ORAL_TABLET | ORAL | 0 refills | Status: DC
  Filled 2022-03-06: qty 20, 10d supply, fill #0

## 2022-03-09 ENCOUNTER — Ambulatory Visit (INDEPENDENT_AMBULATORY_CARE_PROVIDER_SITE_OTHER): Payer: BC Managed Care – PPO | Admitting: Cardiology

## 2022-03-09 ENCOUNTER — Encounter: Payer: Self-pay | Admitting: Cardiology

## 2022-03-09 ENCOUNTER — Other Ambulatory Visit (HOSPITAL_COMMUNITY): Payer: Self-pay

## 2022-03-09 VITALS — BP 90/60 | HR 85 | Ht 66.0 in | Wt 175.0 lb

## 2022-03-09 DIAGNOSIS — I3139 Other pericardial effusion (noninflammatory): Secondary | ICD-10-CM

## 2022-03-09 DIAGNOSIS — R0683 Snoring: Secondary | ICD-10-CM

## 2022-03-09 DIAGNOSIS — I2510 Atherosclerotic heart disease of native coronary artery without angina pectoris: Secondary | ICD-10-CM | POA: Diagnosis not present

## 2022-03-09 DIAGNOSIS — E785 Hyperlipidemia, unspecified: Secondary | ICD-10-CM | POA: Diagnosis not present

## 2022-03-09 MED ORDER — METOPROLOL SUCCINATE ER 50 MG PO TB24
50.0000 mg | ORAL_TABLET | Freq: Every day | ORAL | 3 refills | Status: DC
  Filled 2022-03-09: qty 90, 90d supply, fill #0
  Filled 2022-06-13: qty 90, 90d supply, fill #1
  Filled 2022-09-15: qty 90, 90d supply, fill #2
  Filled 2022-12-10: qty 90, 90d supply, fill #3

## 2022-03-09 MED ORDER — NITROGLYCERIN 0.4 MG SL SUBL
0.4000 mg | SUBLINGUAL_TABLET | SUBLINGUAL | 3 refills | Status: DC | PRN
  Filled 2022-03-09: qty 25, 10d supply, fill #0

## 2022-03-09 MED ORDER — SACUBITRIL-VALSARTAN 24-26 MG PO TABS
1.0000 | ORAL_TABLET | Freq: Two times a day (BID) | ORAL | 3 refills | Status: DC
  Filled 2022-03-09 – 2022-05-15 (×2): qty 180, 90d supply, fill #0
  Filled 2022-08-14: qty 180, 90d supply, fill #1
  Filled 2022-11-15: qty 180, 90d supply, fill #2
  Filled 2023-02-11: qty 180, 90d supply, fill #3

## 2022-03-09 MED ORDER — ATORVASTATIN CALCIUM 80 MG PO TABS
ORAL_TABLET | ORAL | 3 refills | Status: DC
  Filled 2022-03-09: qty 90, 90d supply, fill #0
  Filled 2022-06-09: qty 90, 90d supply, fill #1
  Filled 2022-09-07: qty 90, 90d supply, fill #2
  Filled 2022-12-10: qty 90, 90d supply, fill #3

## 2022-03-09 MED ORDER — TICAGRELOR 90 MG PO TABS
90.0000 mg | ORAL_TABLET | Freq: Two times a day (BID) | ORAL | 3 refills | Status: DC
  Filled 2022-03-09: qty 180, 90d supply, fill #0
  Filled 2022-06-13: qty 180, 90d supply, fill #1

## 2022-03-09 NOTE — Patient Instructions (Signed)

## 2022-03-09 NOTE — Progress Notes (Signed)
Cardiology Office Note:    Date:  03/09/2022   ID:  Bonnie [facility], DOB 1966/05/09, MRN [id number]  PCP:  Bonnie Brunette, MD   [facility] Providers Cardiologist:  Donato Schultz, MD     Referring MD: Tana Felts*     History of Present Illness:    Bonnie [facility] is a 56 y.o. female for an evaluation of NSTEMI, congestive heart failure, and pericarditis.   She was last seen in the Heart Failure Clinic by Dr. Shirlee [facility] on 12/08/2021 where she was doing well.  In review of Dr. Alford [facility] last note from 12/08/2021:   In 10/2020, she was admitted w/ acute pericarditis and large pericardial effusion w/ tamponade, requiring emergent pericardiocentesis. LVEF at the time was hyperdynamic, 65-70%. Pericardial fluid showed elevated protein and high normal glucose levels. It was negative for cultures and AFB stain and positive for reactive mesothelial cells. She was treated w/ high dose ASA and colchicine. Also noted to have brief occurrence of Afib w/ RVR during admission but spontaneously converted to NSR. No indication for anticoagulation (CHA2DS2VASc of 1).    Readmitted 12/22 w/ chest pain and ruled in for NSTEMI.  HS-troponin I peaked at 4263 ng. 2D echo showed newly reduced LVEF,40-45%. RV normal. No pericardial effusion.    Subsequent LHC showed severe 2 vessel CAD,95 to 99% pLAD treated w/ PCI + DES, as well as 95 to 99% LCx lesion also treated w/ PCI +DES. LFEV estimated to be lower by LVG at ~25%. Placed on DAPT w/ ASA + Brilinta, high intensity statin + GDMT for HF w/ Entresto, Jardiance and Toprol XL. Referred to [facility] clinic.    Seen in TOC, GDMT titrated and referred to [facility] to establish care for CAD with general cardiology.    Sleep study in 2/23 showed no OSA.    Echo was done and reviewed, EF up to 60-65%, normal RV, no pericardial effusion.     Today: She is feeling well today. She reports that she graduated from the heart failure clinic. We reviewed the events  leading up to her prior NSTEMI. She states that she was unaware she was having a heart attack. She went to her doctor with complaints of shortness of breath. She had meniscus surgery which delayed her prior testing.   She states that she has always been obese. Since April 2023, she has lost almost 20 lbs. She goes without meat on Mondays and  frequently eats fish. She is not a smoker.  Currently on Wegovy.  She retired from middle school teaching math.  She is now working part-time  She denies any palpitations, chest pain, shortness of breath, or peripheral edema. No lightheadedness, headaches, syncope, orthopnea, or PND.   Her grandfather was 53 yo when he died of MI. Her mother had a heart blockage.   Of note, she had a COVID infection October 2022.  Past Medical History:  Diagnosis Date   Anxiety    Arthritis    Asthma    CHF (congestive heart failure) (HCC)    Coronary artery disease    Myocardial infarct (HCC)    Pericarditis    Pneumonia     Past Surgical History:  Procedure Laterality Date   achalasia N/A 09/2005   CARDIAC CATHETERIZATION     COLONOSCOPY     DILATION AND CURETTAGE OF UTERUS     KNEE ARTHROSCOPY WITH MEDIAL MENISECTOMY Right 03/22/2021   Procedure: KNEE ARTHROSCOPY WITH MEDIAL AND LATERAL  MENISECTOMY;  Surgeon: Bonnie Stairs, MD;  Location: [facility];  Service: Orthopedics;  Laterality: Right;   LEFT HEART CATH AND CORONARY ANGIOGRAPHY N/A 07/20/2021   Procedure: LEFT HEART CATH AND CORONARY ANGIOGRAPHY;  Surgeon: Bonnie Prows, MD;  Location: [facility];  Service: Cardiovascular;  Laterality: N/A;   NASAL SEPTUM SURGERY     PERICARDIAL FLUID DRAINAGE  10/03/2019   PERICARDIOCENTESIS N/A 10/03/2019   Procedure: PERICARDIOCENTESIS;  Surgeon: Bonnie Dials, MD;  Location: [place] [facility];  Service: Cardiovascular;  Laterality: N/A;    Current Medications: Current Meds  Medication Sig   acetaminophen (TYLENOL) 500 MG tablet Take 1,000 mg by  mouth every 6 (six) hours as needed for mild pain or moderate pain.   ALPRAZolam (XANAX) 0.25 MG tablet Take 1 tablet by mouth 3 times a day as neeed for anxiety   amoxicillin-clavulanate (AUGMENTIN) 500-125 MG tablet Take 1 tablet by mouth every 12 hours for 10 days   aspirin EC 81 MG tablet Take 81 mg by mouth every evening.   Azelastine-Fluticasone 137-50 MCG/ACT SUSP Use 1 spray in each nostril twice a day   desloratadine (CLARINEX) 5 MG tablet Take 1 tablet by mouth daily   empagliflozin (JARDIANCE) 10 MG TABS tablet Take 1 tablet by mouth daily.   fexofenadine (ALLEGRA) 180 MG tablet Take 180 mg by mouth in the morning.   Fluticasone-Umeclidin-Vilant (TRELEGY ELLIPTA) 200-62.5-25 MCG/ACT AEPB Inhale 1 puff into lungs Once a day   ipratropium (ATROVENT) 0.06 % nasal spray Place 2 sprays into both nostrils daily as needed for allergies.   levalbuterol (XOPENEX HFA) 45 MCG/ACT inhaler Inhale 2 puffs into the lungs every 4-6 hours as needed for cough/wheeze.   montelukast (SINGULAIR) 10 MG tablet Take 1 tablet (10 mg total) by mouth daily.   pantoprazole (PROTONIX) 40 MG tablet Take 1 tablet by mouth daily   Semaglutide-Weight Management (WEGOVY) 1 MG/0.5ML SOAJ Inject 1 mg subcutaneously weekly   sertraline (ZOLOFT) 100 MG tablet TAKE 1 AND 1/2 TABLETS BY MOUTH EVERY DAY   spironolactone (ALDACTONE) 25 MG tablet Take 1/2 tablet by mouth daily.   [DISCONTINUED] atorvastatin (LIPITOR) 80 MG tablet Take 1 tablet by mouth nightly   [DISCONTINUED] metoprolol succinate (TOPROL-XL) 50 MG 24 hr tablet Take 1 tablet (50 mg total) by mouth daily. Take with or immediately following a meal.   [DISCONTINUED] nitroGLYCERIN (NITROSTAT) 0.4 MG SL tablet Place 1 tablet (0.4 mg total) under the tongue every 5 (five) minutes x 3 doses as needed for chest pain.   [DISCONTINUED] sacubitril-valsartan (ENTRESTO) 24-26 MG Take 1 tablet by mouth 2 (two) times daily.   [DISCONTINUED] ticagrelor (BRILINTA) 90 MG TABS  tablet Take 1 tablet (90 mg total) by mouth 2 (two) times daily.     Allergies:   Patient has no known allergies.   Social History   Socioeconomic History   Marital status: Married    Spouse name: Paislynn Rheams   Number of children: 2   Years of education: 18   Highest education level: Master's degree (e.g., MA, MS, MEng, MEd, MSW, MBA)  Occupational History   Occupation: middle school Music therapist    Comment: [place]  Tobacco Use   Smoking status: Never   Smokeless tobacco: Never  Scientific laboratory technician Use: Never used  Substance and Sexual Activity   Alcohol use: Not Currently    Alcohol/week: 3.0 standard drinks of alcohol    Types: 3 Glasses of wine per week  Comment: OCCASIONAL   Drug use: Never   Sexual activity: Yes  Other Topics Concern   Not on file  Social History Narrative   Not on file   Social Determinants of Health   Financial Resource Strain: Low Risk  (07/22/2021)   Overall Financial Resource Strain (CARDIA)    Difficulty of Paying Living Expenses: Not hard at all  Food Insecurity: No Food Insecurity (07/22/2021)   Hunger Vital Sign    Worried About Running Out of Food in the Last Year: Never true    Ran Out of Food in the Last Year: Never true  Transportation Needs: No Transportation Needs (07/22/2021)   PRAPARE - Hydrologist (Medical): No    Lack of Transportation (Non-Medical): No  Physical Activity: Not on file  Stress: Not on file  Social Connections: Not on file     Family History: The patient's family history includes Hypertension in her father.  ROS:   Please see the history of present illness.   (+) Easy bruising All other systems reviewed and are negative.  EKGs/Labs/Other Studies Reviewed:    The following studies were reviewed today:  Echocardiogram 12/08/2021:  1. Left ventricular ejection fraction, by estimation, is 60 to 65%. The  left ventricle has normal function. The left ventricle  has no regional  wall motion abnormalities. Left ventricular diastolic parameters are  consistent with Grade I diastolic  dysfunction (impaired relaxation).   2. Right ventricular systolic function is low normal. The right  ventricular size is normal. There is normal pulmonary artery systolic  pressure.   3. The mitral valve is grossly normal. Trivial mitral valve  regurgitation.   4. The aortic valve is tricuspid. Aortic valve regurgitation is not  visualized.   5. The inferior vena cava is normal in size with greater than 50%  respiratory variability, suggesting right atrial pressure of 3 mmHg.   Comparison(s): Changes from prior study are noted. 07/20/2021: LVEF  40-45%, mid-apical septal and anterior hypokinesis.   Left Cardiac Cath and Coronary Angiography 07/20/2021: Left Heart Catheterization 07/20/21:  LV: 128/2, EDP 30 mmHg.  Ao 125/71, mean 96 mmHg.  No pressure gradient across the aortic valve. LVEF, global hypokinesis, anterolateral hypokinesis.  Hand contrast injection only performed due to elevated EDP. RCA: Nondominant, small and normal. LM: Nonexistent. LAD: Separate ostia.  Proximal LAD has a smooth 95 to 99% stenosis with TIMI II flow. Successful direct stenting with a 3.5 x 18 mm Onyx frontier DES, stenosis reduced to 0% with TIMI II to TIMI-3 flow improvement. LCx: Dominant.  Large-caliber vessel.  Gives origin to large OM1, small OM 2 and OM 3 is large.  Has a focal 95% to 99% stenosis.  Direct stenting with 3.5 x 16 mm Synergy XD DES, stenosis reduced to 0% with TIMI-3 to TIMI-3 flow.  Recommendation: Patient has ischemic cardiomyopathy with markedly dilated LV.  She will need guideline directed medical therapy, was started on metoprolol succinate 12.5 mg twice daily in view of underlying reactive airway disease Coreg was not chosen.  She was also started on Entresto 24/26 mg twice daily.  Further management is as per Dr. Doylene Canard.  120 mL contrast utilized.  EKG:   EKG was personally reviewed. 03/09/22: EKG was not ordered.    Recent Labs: 07/20/2021: TSH 3.134 07/22/2021: B Natriuretic Peptide 538.9 12/02/2021: ALT 18; BUN 24; Creatinine, Ser 1.15; Hemoglobin 13.7; Platelets 263; Potassium 3.6; Sodium 138  Recent Lipid Panel    Component Value  Date/Time   CHOL 97 12/08/2021 1534   TRIG 87 12/08/2021 1534   HDL 38 (L) 12/08/2021 1534   CHOLHDL 2.6 12/08/2021 1534   VLDL 17 12/08/2021 1534   LDLCALC 42 12/08/2021 1534     Risk Assessment/Calculations:          Physical Exam:    VS:  BP 90/60 (BP Location: Left Arm, Patient Position: Sitting, Cuff Size: Normal)   Pulse 85   Ht 5\' 6"  (1.676 m)   Wt 175 lb (79.4 kg)   SpO2 97%   BMI 28.25 kg/m     Wt Readings from Last 3 Encounters:  03/09/22 175 lb (79.4 kg)  12/08/21 187 lb (84.8 kg)  11/17/21 190 lb 11.2 oz (86.5 kg)     GEN: Well nourished, well developed in no acute distress HEENT: Normal NECK: No JVD; No carotid bruits LYMPHATICS: No lymphadenopathy CARDIAC: RRR, no murmurs, rubs, gallops RESPIRATORY:  Clear to auscultation without rales, wheezing or rhonchi  ABDOMEN: Soft, non-tender, non-distended MUSCULOSKELETAL:  No edema; No deformity  SKIN: Warm and dry; Multiple ecchymoses of bilateral LE NEUROLOGIC:  Alert and oriented x 3 PSYCHIATRIC:  Normal affect   ASSESSMENT:    No diagnosis found. PLAN:    In order of problems listed above:  Chronic Systolic Heart Failure, iCM. - Ischemic CM, in setting of high grade left system CAD as outlined below, now s/p PCI to LAD and LCx - Echo (12/22): EF down to 40-45% (noted to be ~25% by LVG at time of cath, previously 65%). RV normal  - NYHA Class I. Euvolemic on exam, weight down 1 lb.  - Echo 12/2021 shows EF up to 60-65% with normal RV.  - Continue spiro 12.5 mg daily. BMET today. - Continue Entresto 24-26 mg bid.  - Continue Jardiance 10 mg daily.  - Continue Toprol XL 50 mg daily.  - No need for loop diuretic.  -  EF back to normal range, no ICD.  -If blood pressure too low at home, we can pull back on medication.   2. CAD - Recent NSTEMI 07/2021. LHC w/ severe 2VCAD, 95-99% pLAD and 95-99% LCx lesion, both lesions treated w/ PCI + DES - Stable w/o angina.  - DAPT w/ ASA + Brilinta for minimum of 12 months (~12/23). - On atorvastatin 80 mg daily-LDL at goal less than 70.  -Participated in cardiac rehab   3. H/o Pericardial Effusion/ Tamponade, 2/2 Acute Pericarditis  - Diagnosed 3/22, s/p emergent pericardiocentesis  - Completed tx w/ high dose ASA + 3 months of colchicine. - Echoes 12/22 and 5/23 showed no effusion.  No change.  Excellent   4. Obesity - Body mass index is 30.18 kg/m. - Continue current weight loss efforts. - She is on semaglutide.  Prescribed by Dr. 6/23 office.  Her BMI is currently 28.   5. Snoring - No OSA on home sleep study 2/23.          Follow-up:  6 months  Medication Adjustments/Labs and Tests Ordered: Current medicines are reviewed at length with the patient today.  Concerns regarding medicines are outlined above.  No orders of the defined types were placed in this encounter.  Meds ordered this encounter  Medications   nitroGLYCERIN (NITROSTAT) 0.4 MG SL tablet    Sig: Place 1 tablet (0.4 mg total) under the tongue every 5 (five) minutes x 3 doses as needed for chest pain.    Dispense:  25 tablet    Refill:  3   ticagrelor (BRILINTA) 90 MG TABS tablet    Sig: Take 1 tablet (90 mg total) by mouth 2 (two) times daily.    Dispense:  180 tablet    Refill:  3    Pt requests 90 day supply- Please discontinue any other rx for Brilinta   sacubitril-valsartan (ENTRESTO) 24-26 MG    Sig: Take 1 tablet by mouth 2 (two) times daily.    Dispense:  180 tablet    Refill:  3    Pt requests 90 day supply Please d/c any other rx for this medication   metoprolol succinate (TOPROL-XL) 50 MG 24 hr tablet    Sig: Take 1 tablet (50 mg total) by mouth daily. Take  with or immediately following a meal.    Dispense:  90 tablet    Refill:  3    Pt requests 90 day supply   atorvastatin (LIPITOR) 80 MG tablet    Sig: Take 1 tablet by mouth nightly    Dispense:  90 tablet    Refill:  3    IMPORTANT- Apply Patient Savings [id number], PCN:[id number], [id number], [id number], Submit as primary insurance to [employer]. Questions: [phone] Pt requests 90 day supply    Patient Instructions  Medication Instructions:  The current medical regimen is effective;  continue present plan and medications.  *If you need a refill on your cardiac medications before your next appointment, please call your pharmacy*  Follow-Up: At [facility], you and your health needs are our priority.  As part of our continuing mission to provide you with exceptional heart care, we have created designated Provider Care Teams.  These Care Teams include your primary Cardiologist (physician) and Advanced Practice Providers (APPs -  Physician Assistants and Nurse Practitioners) who all work together to provide you with the care you need, when you need it.  We recommend signing up for the patient portal called "MyChart".  Sign up information is provided on this After Visit Summary.  MyChart is used to connect with patients for Virtual Visits (Telemedicine).  Patients are able to view lab/test results, encounter notes, upcoming appointments, etc.  Non-urgent messages can be sent to your provider as well.   To learn more about what you can do with MyChart, go to [email or web address].    Your next appointment:   6 month(s)  The format for your next appointment:   In Person  Provider:   Candee Furbish, MD {   Important Information About Sugar         I,Breanna Adamick,acting as a scribe for Candee Furbish, MD.,have documented all relevant documentation on the behalf of Candee Furbish, MD,as directed by  Candee Furbish, MD while in the presence of Candee Furbish, MD.  I,  Candee Furbish, MD, have reviewed all documentation for this visit. The documentation on 03/09/22 for the exam, diagnosis, procedures, and orders are all accurate and complete.   Signed, Candee Furbish, MD  03/09/2022 10:40 AM    [place]

## 2022-03-13 ENCOUNTER — Other Ambulatory Visit (HOSPITAL_COMMUNITY): Payer: Self-pay

## 2022-03-22 ENCOUNTER — Other Ambulatory Visit (HOSPITAL_COMMUNITY): Payer: Self-pay

## 2022-04-03 ENCOUNTER — Other Ambulatory Visit (HOSPITAL_COMMUNITY): Payer: Self-pay

## 2022-04-03 MED ORDER — DESLORATADINE 5 MG PO TABS
5.0000 mg | ORAL_TABLET | Freq: Every day | ORAL | 3 refills | Status: DC
  Filled 2022-04-03: qty 30, 30d supply, fill #0
  Filled 2022-05-02: qty 30, 30d supply, fill #1

## 2022-04-10 ENCOUNTER — Encounter: Payer: Self-pay | Admitting: Cardiology

## 2022-04-11 ENCOUNTER — Other Ambulatory Visit (HOSPITAL_COMMUNITY): Payer: Self-pay

## 2022-04-11 MED ORDER — EMPAGLIFLOZIN 10 MG PO TABS
10.0000 mg | ORAL_TABLET | Freq: Every day | ORAL | 11 refills | Status: DC
  Filled 2022-04-11: qty 30, 30d supply, fill #0
  Filled 2022-05-02: qty 30, 30d supply, fill #1
  Filled 2022-06-05: qty 30, 30d supply, fill #2
  Filled 2022-07-05: qty 30, 30d supply, fill #3
  Filled 2022-08-04: qty 30, 30d supply, fill #4
  Filled 2022-09-04: qty 30, 30d supply, fill #5
  Filled 2022-10-01: qty 30, 30d supply, fill #6
  Filled 2022-10-31: qty 30, 30d supply, fill #7
  Filled 2022-11-30: qty 30, 30d supply, fill #8
  Filled 2022-12-27: qty 30, 30d supply, fill #9
  Filled 2023-01-24: qty 30, 30d supply, fill #10
  Filled 2023-02-26: qty 30, 30d supply, fill #11

## 2022-04-11 MED ORDER — PANTOPRAZOLE SODIUM 40 MG PO TBEC
DELAYED_RELEASE_TABLET | ORAL | 3 refills | Status: DC
  Filled 2022-04-11: qty 90, 90d supply, fill #0
  Filled 2022-07-05: qty 90, 90d supply, fill #1
  Filled 2022-10-01: qty 90, 90d supply, fill #2
  Filled 2023-01-01: qty 90, 90d supply, fill #3

## 2022-04-12 ENCOUNTER — Other Ambulatory Visit (HOSPITAL_COMMUNITY): Payer: Self-pay

## 2022-04-13 ENCOUNTER — Other Ambulatory Visit (HOSPITAL_COMMUNITY): Payer: Self-pay

## 2022-04-18 ENCOUNTER — Other Ambulatory Visit (HOSPITAL_COMMUNITY): Payer: Self-pay

## 2022-04-18 MED ORDER — MONTELUKAST SODIUM 10 MG PO TABS
10.0000 mg | ORAL_TABLET | Freq: Every day | ORAL | 3 refills | Status: DC
  Filled 2022-04-18: qty 90, 90d supply, fill #0
  Filled 2022-07-16: qty 90, 90d supply, fill #1
  Filled 2022-10-12: qty 90, 90d supply, fill #2
  Filled 2023-01-11: qty 90, 90d supply, fill #3

## 2022-04-18 MED ORDER — SERTRALINE HCL 100 MG PO TABS
150.0000 mg | ORAL_TABLET | Freq: Every day | ORAL | 1 refills | Status: DC
  Filled 2022-04-18: qty 135, 90d supply, fill #0
  Filled 2022-07-16: qty 135, 90d supply, fill #1

## 2022-04-19 ENCOUNTER — Other Ambulatory Visit (HOSPITAL_COMMUNITY): Payer: Self-pay

## 2022-05-02 ENCOUNTER — Other Ambulatory Visit (HOSPITAL_COMMUNITY): Payer: Self-pay

## 2022-05-04 ENCOUNTER — Other Ambulatory Visit (HOSPITAL_COMMUNITY): Payer: Self-pay

## 2022-05-08 ENCOUNTER — Other Ambulatory Visit (HOSPITAL_COMMUNITY): Payer: Self-pay

## 2022-05-15 ENCOUNTER — Other Ambulatory Visit (HOSPITAL_COMMUNITY): Payer: Self-pay

## 2022-05-15 MED ORDER — LEVOCETIRIZINE DIHYDROCHLORIDE 5 MG PO TABS
5.0000 mg | ORAL_TABLET | Freq: Every evening | ORAL | 0 refills | Status: DC
  Filled 2022-05-15: qty 30, 30d supply, fill #0

## 2022-05-16 ENCOUNTER — Other Ambulatory Visit (HOSPITAL_COMMUNITY): Payer: Self-pay

## 2022-05-22 ENCOUNTER — Other Ambulatory Visit (HOSPITAL_COMMUNITY): Payer: Self-pay

## 2022-06-06 ENCOUNTER — Other Ambulatory Visit (HOSPITAL_COMMUNITY): Payer: Self-pay

## 2022-06-08 ENCOUNTER — Other Ambulatory Visit (HOSPITAL_COMMUNITY): Payer: Self-pay

## 2022-06-08 MED ORDER — WEGOVY 1.7 MG/0.75ML ~~LOC~~ SOAJ
1.7000 mg | SUBCUTANEOUS | 3 refills | Status: DC
  Filled 2022-06-08: qty 3, 28d supply, fill #0
  Filled 2022-07-05: qty 3, 28d supply, fill #1
  Filled 2022-08-08: qty 3, 28d supply, fill #2
  Filled 2022-09-15: qty 3, 28d supply, fill #3

## 2022-06-09 ENCOUNTER — Other Ambulatory Visit (HOSPITAL_COMMUNITY): Payer: Self-pay

## 2022-06-10 ENCOUNTER — Other Ambulatory Visit (HOSPITAL_COMMUNITY): Payer: Self-pay

## 2022-06-12 ENCOUNTER — Other Ambulatory Visit (HOSPITAL_COMMUNITY): Payer: Self-pay

## 2022-06-12 MED ORDER — AZELASTINE-FLUTICASONE 137-50 MCG/ACT NA SUSP
1.0000 | Freq: Two times a day (BID) | NASAL | 5 refills | Status: DC
  Filled 2022-06-12: qty 23, 30d supply, fill #0
  Filled 2022-07-16: qty 23, 30d supply, fill #1
  Filled 2022-08-14: qty 23, 30d supply, fill #2
  Filled 2022-09-15: qty 23, 30d supply, fill #3
  Filled 2022-10-31: qty 23, 30d supply, fill #4
  Filled 2022-11-30: qty 23, 30d supply, fill #5

## 2022-06-13 ENCOUNTER — Other Ambulatory Visit (HOSPITAL_COMMUNITY): Payer: Self-pay

## 2022-07-05 ENCOUNTER — Other Ambulatory Visit (HOSPITAL_COMMUNITY): Payer: Self-pay

## 2022-07-06 ENCOUNTER — Other Ambulatory Visit (HOSPITAL_COMMUNITY): Payer: Self-pay

## 2022-07-06 MED ORDER — TRELEGY ELLIPTA 200-62.5-25 MCG/ACT IN AEPB
1.0000 | INHALATION_SPRAY | Freq: Every day | RESPIRATORY_TRACT | 5 refills | Status: DC
  Filled 2022-07-06: qty 60, 30d supply, fill #0
  Filled 2022-08-04: qty 60, 30d supply, fill #1
  Filled 2022-09-04: qty 60, 30d supply, fill #2
  Filled 2022-10-01: qty 60, 30d supply, fill #3
  Filled 2022-10-31: qty 60, 30d supply, fill #4
  Filled 2022-11-30: qty 60, 30d supply, fill #5

## 2022-07-06 MED ORDER — LEVOCETIRIZINE DIHYDROCHLORIDE 5 MG PO TABS
5.0000 mg | ORAL_TABLET | Freq: Every evening | ORAL | 5 refills | Status: DC
  Filled 2022-07-06: qty 30, 30d supply, fill #0
  Filled 2022-08-04: qty 30, 30d supply, fill #1
  Filled 2022-09-04: qty 30, 30d supply, fill #2
  Filled 2022-10-01: qty 30, 30d supply, fill #3
  Filled 2022-10-31: qty 30, 30d supply, fill #4
  Filled 2022-11-30: qty 30, 30d supply, fill #5

## 2022-07-07 ENCOUNTER — Other Ambulatory Visit (HOSPITAL_COMMUNITY): Payer: Self-pay

## 2022-07-10 ENCOUNTER — Other Ambulatory Visit (HOSPITAL_COMMUNITY): Payer: Self-pay

## 2022-07-11 ENCOUNTER — Other Ambulatory Visit (HOSPITAL_COMMUNITY): Payer: Self-pay

## 2022-07-15 ENCOUNTER — Other Ambulatory Visit (HOSPITAL_COMMUNITY): Payer: Self-pay

## 2022-07-17 ENCOUNTER — Other Ambulatory Visit (HOSPITAL_COMMUNITY): Payer: Self-pay

## 2022-07-18 ENCOUNTER — Other Ambulatory Visit (HOSPITAL_COMMUNITY): Payer: Self-pay

## 2022-07-18 MED ORDER — SPIRONOLACTONE 25 MG PO TABS
ORAL_TABLET | ORAL | 0 refills | Status: DC
  Filled 2022-07-18: qty 45, 90d supply, fill #0

## 2022-07-20 ENCOUNTER — Other Ambulatory Visit (HOSPITAL_COMMUNITY): Payer: Self-pay

## 2022-07-28 ENCOUNTER — Other Ambulatory Visit (HOSPITAL_COMMUNITY): Payer: Self-pay

## 2022-07-28 MED ORDER — AZITHROMYCIN 250 MG PO TABS
ORAL_TABLET | ORAL | 0 refills | Status: DC
  Filled 2022-07-28: qty 6, 5d supply, fill #0

## 2022-07-28 MED ORDER — PREDNISONE 10 MG PO TABS
ORAL_TABLET | ORAL | 0 refills | Status: DC
  Filled 2022-07-28: qty 27, 10d supply, fill #0

## 2022-08-04 ENCOUNTER — Other Ambulatory Visit: Payer: Self-pay

## 2022-08-04 ENCOUNTER — Other Ambulatory Visit (HOSPITAL_COMMUNITY): Payer: Self-pay

## 2022-08-04 ENCOUNTER — Other Ambulatory Visit (HOSPITAL_COMMUNITY): Payer: Self-pay | Admitting: Cardiology

## 2022-08-04 MED ORDER — SPIRONOLACTONE 25 MG PO TABS
12.5000 mg | ORAL_TABLET | Freq: Every day | ORAL | 0 refills | Status: DC
  Filled 2022-08-04: qty 90, 180d supply, fill #0
  Filled 2022-09-15 – 2022-10-01 (×2): qty 45, 90d supply, fill #0
  Filled 2022-12-27: qty 45, 90d supply, fill #1

## 2022-08-08 ENCOUNTER — Other Ambulatory Visit (HOSPITAL_COMMUNITY): Payer: Self-pay

## 2022-08-09 ENCOUNTER — Encounter (HOSPITAL_BASED_OUTPATIENT_CLINIC_OR_DEPARTMENT_OTHER): Payer: Self-pay | Admitting: Cardiology

## 2022-08-09 DIAGNOSIS — I2510 Atherosclerotic heart disease of native coronary artery without angina pectoris: Secondary | ICD-10-CM

## 2022-08-10 NOTE — Telephone Encounter (Signed)

## 2022-08-14 ENCOUNTER — Other Ambulatory Visit (HOSPITAL_COMMUNITY): Payer: Self-pay

## 2022-08-19 ENCOUNTER — Encounter: Payer: Self-pay | Admitting: Cardiology

## 2022-08-19 ENCOUNTER — Other Ambulatory Visit (HOSPITAL_COMMUNITY): Payer: Self-pay

## 2022-08-21 ENCOUNTER — Other Ambulatory Visit (HOSPITAL_COMMUNITY): Payer: Self-pay

## 2022-08-21 ENCOUNTER — Telehealth: Payer: Self-pay

## 2022-08-21 NOTE — Telephone Encounter (Signed)
**Note De-Identified Javana Schey Obfuscation** Tavaria Bellew Key: [id number] - PA Case ID: [id number] Outcome Approved today Your PA request has been approved. Additional information will be provided in the approval communication. (Message 1145) Drug Entresto 24-26MG  tablets Form [employer] Electronic PA Form (2017 NCPDP)  I have notified [place] (Ph: [phone]) of this approval Khole Arterburn VM and the pt Misa Fedorko a [place] message.

## 2022-08-22 ENCOUNTER — Other Ambulatory Visit: Payer: Self-pay

## 2022-08-22 ENCOUNTER — Other Ambulatory Visit (HOSPITAL_COMMUNITY): Payer: Self-pay

## 2022-09-06 ENCOUNTER — Other Ambulatory Visit (HOSPITAL_COMMUNITY): Payer: Self-pay

## 2022-09-07 ENCOUNTER — Encounter: Payer: Self-pay | Admitting: Cardiology

## 2022-09-07 ENCOUNTER — Ambulatory Visit: Payer: BC Managed Care – PPO | Attending: Cardiology | Admitting: Cardiology

## 2022-09-07 VITALS — BP 96/60 | HR 84 | Ht 66.0 in | Wt 168.0 lb

## 2022-09-07 DIAGNOSIS — I2510 Atherosclerotic heart disease of native coronary artery without angina pectoris: Secondary | ICD-10-CM | POA: Diagnosis not present

## 2022-09-07 DIAGNOSIS — E785 Hyperlipidemia, unspecified: Secondary | ICD-10-CM

## 2022-09-07 DIAGNOSIS — Z79899 Other long term (current) drug therapy: Secondary | ICD-10-CM

## 2022-09-07 DIAGNOSIS — I255 Ischemic cardiomyopathy: Secondary | ICD-10-CM | POA: Diagnosis not present

## 2022-09-07 NOTE — Progress Notes (Signed)
Cardiology Office Note:    Date:  09/07/2022   ID:  HARRIS KISTLER, DOB June 16, 1966, MRN [id number]  PCP:  Deland Pretty, MD   [place] [facility] Cardiologist:  Candee Furbish, MD     Referring MD: Deland Pretty, MD     History of Present Illness:    Bonnie Nelson is a 57 y.o. female for an evaluation of NSTEMI, congestive heart failure, and pericarditis.   She was last seen in the Heart Failure Clinic by Dr. Aundra Dubin on 12/08/2021 where she was doing well.  In review of Dr. Claris Gladden last note from 12/08/2021:   In 10/2020, she was admitted w/ acute pericarditis and large pericardial effusion w/ tamponade, requiring emergent pericardiocentesis. LVEF at the time was hyperdynamic, 65-70%. Pericardial fluid showed elevated protein and high normal glucose levels. It was negative for cultures and AFB stain and positive for reactive mesothelial cells. She was treated w/ high dose ASA and colchicine. Also noted to have brief occurrence of Afib w/ RVR during admission but spontaneously converted to NSR. No indication for anticoagulation (CHA2DS2VASc of 1).    Readmitted 12/22 w/ chest pain and ruled in for NSTEMI.  HS-troponin I peaked at 4263 ng. 2D echo showed newly reduced LVEF,40-45%. RV normal. No pericardial effusion.    Subsequent LHC showed severe 2 vessel CAD,95 to 99% pLAD treated w/ PCI + DES, as well as 95 to 99% LCx lesion also treated w/ PCI +DES. LFEV estimated to be lower by LVG at ~25%. Placed on DAPT w/ ASA + Brilinta, high intensity statin + GDMT for HF w/ Entresto, Jardiance and Toprol XL. Referred to [facility] clinic.    Seen in TOC, GDMT titrated and referred to [facility] to establish care for CAD with general cardiology.    Sleep study in 2/23 showed no OSA.    Echo was done and reviewed, EF up to 60-65%, normal RV, no pericardial effusion.      She reports that she graduated from the heart failure clinic. We reviewed the events leading up to her prior NSTEMI. She  states that she was unaware she was having a heart attack. She went to her doctor with complaints of shortness of breath. She had meniscus surgery which delayed her prior testing.   She states that she has always been obese. Since April 2023, she has lost almost 20 lbs. She goes without meat on Mondays and  frequently eats fish. She is not a smoker.  Currently on Wegovy.  Has done an excellent job with weight loss.    She retired from middle school teaching math.  She is now working part-time  Her grandfather was 10 yo when he died of MI. Her mother had a heart blockage. Sister has elevated CAC as well.   Of note, she had a COVID infection October 2022.  Denies any fevers chills nausea vomiting syncope bleeding.  No further evidence of pericarditis.  Echocardiogram reassuring.  Past Medical History:  Diagnosis Date   Anxiety    Arthritis    Asthma    CHF (congestive heart failure) ([place])    Coronary artery disease    Myocardial infarct ([place])    Pericarditis    Pneumonia     Past Surgical History:  Procedure Laterality Date   achalasia N/A 09/2005   CARDIAC CATHETERIZATION     COLONOSCOPY     DILATION AND CURETTAGE OF UTERUS     KNEE ARTHROSCOPY WITH MEDIAL MENISECTOMY Right 03/22/2021   Procedure:  KNEE ARTHROSCOPY WITH MEDIAL AND LATERAL  MENISECTOMY;  Surgeon: Yolonda Kida, MD;  Location: [facility] OR[facility];  Service: Orthopedics;  Laterality: Right;   LEFT HEART CATH AND CORONARY ANGIOGRAPHY N/A 07/20/2021   Procedure: LEFT HEART CATH AND CORONARY ANGIOGRAPHY;  Surgeon: Yates Decamp, MD;  Location: [facility] INVASIVE CV LAB;  Service: Cardiovascular;  Laterality: N/A;   NASAL SEPTUM SURGERY     PERICARDIAL FLUID DRAINAGE  10/03/2019   PERICARDIOCENTESIS N/A 10/03/2019   Procedure: PERICARDIOCENTESIS;  Surgeon: Orpah Cobb, MD;  Location: [facility] INVASIVE CV LAB;  Service: Cardiovascular;  Laterality: N/A;    Current Medications: Current Meds  Medication Sig   acetaminophen (TYLENOL) 500  MG tablet Take 1,000 mg by mouth every 6 (six) hours as needed for mild pain or moderate pain.   ALPRAZolam (XANAX) 0.25 MG tablet Take 1 tablet by mouth 3 times a day as neeed for anxiety   aspirin EC 81 MG tablet Take 81 mg by mouth every evening.   atorvastatin (LIPITOR) 80 MG tablet Take 1 tablet by mouth nightly   Azelastine-Fluticasone 137-50 MCG/ACT SUSP Use 1 spray in each nostril twice a day   empagliflozin (JARDIANCE) 10 MG TABS tablet Take 1 tablet by mouth daily.   fexofenadine (ALLEGRA) 180 MG tablet Take 180 mg by mouth in the morning.   Fluticasone-Umeclidin-Vilant (TRELEGY ELLIPTA) 200-62.5-25 MCG/ACT AEPB Inhale 1 puff into the lungs daily.   ipratropium (ATROVENT) 0.06 % nasal spray Place 2 sprays into both nostrils daily as needed for allergies.   levalbuterol (XOPENEX HFA) 45 MCG/ACT inhaler Inhale 2 puffs into the lungs every 4-6 hours as needed for cough/wheeze.   levocetirizine (XYZAL) 5 MG tablet Take 1 tablet (5 mg total) by mouth every evening.   metoprolol succinate (TOPROL-XL) 50 MG 24 hr tablet Take 1 tablet (50 mg total) by mouth daily. Take with or immediately following a meal.   montelukast (SINGULAIR) 10 MG tablet Take 1 tablet (10 mg total) by mouth daily.   nitroGLYCERIN (NITROSTAT) 0.4 MG SL tablet Place 1 tablet (0.4 mg total) under the tongue every 5 (five) minutes x 3 doses as needed for chest pain.   pantoprazole (PROTONIX) 40 MG tablet Take 1 tablet by mouth daily   sacubitril-valsartan (ENTRESTO) 24-26 MG Take 1 tablet by mouth 2 (two) times daily.   Semaglutide-Weight Management (WEGOVY) 1.7 MG/0.75ML SOAJ Inject 1.7 mg into the skin once a week.   sertraline (ZOLOFT) 100 MG tablet TAKE 1 AND 1/2 TABLETS BY MOUTH EVERY DAY   spironolactone (ALDACTONE) 25 MG tablet Take 0.5 tablets (12.5 mg total) by mouth daily.     Allergies:   Patient has no known allergies.   Social History   Socioeconomic History   Marital status: Married    Spouse name: Jamaira Sherk   Number of children: 2   Years of education: 18   Highest education level: Master's degree (e.g., MA, MS, MEng, MEd, MSW, MBA)  Occupational History   Occupation: middle school Editor, commissioning    Comment: [employer]  Tobacco Use   Smoking status: Never   Smokeless tobacco: Never  Building services engineer Use: Never used  Substance and Sexual Activity   Alcohol use: Not Currently    Alcohol/week: 3.0 standard drinks of alcohol    Types: 3 Glasses of wine per week    Comment: OCCASIONAL   Drug use: Never   Sexual activity: Yes  Other Topics Concern   Not on file  Social  History Narrative   Not on file   Social Determinants of Health   Financial Resource Strain: Low Risk  (07/22/2021)   Overall Financial Resource Strain (CARDIA)    Difficulty of Paying Living Expenses: Not hard at all  Food Insecurity: No Food Insecurity (07/22/2021)   Hunger Vital Sign    Worried About Running Out of Food in the Last Year: Never true    Ran Out of Food in the Last Year: Never true  Transportation Needs: No Transportation Needs (07/22/2021)   PRAPARE - Hydrologist (Medical): No    Lack of Transportation (Non-Medical): No  Physical Activity: Not on file  Stress: Not on file  Social Connections: Not on file     Family History: The patient's family history includes Hypertension in her father.  ROS:   Please see the history of present illness.   (+) Easy bruising All other systems reviewed and are negative.  EKGs/Labs/Other Studies Reviewed:    The following studies were reviewed today:  Echocardiogram 12/08/2021:  1. Left ventricular ejection fraction, by estimation, is 60 to 65%. The  left ventricle has normal function. The left ventricle has no regional  wall motion abnormalities. Left ventricular diastolic parameters are  consistent with Grade I diastolic  dysfunction (impaired relaxation).   2. Right ventricular systolic function is low  normal. The right  ventricular size is normal. There is normal pulmonary artery systolic  pressure.   3. The mitral valve is grossly normal. Trivial mitral valve  regurgitation.   4. The aortic valve is tricuspid. Aortic valve regurgitation is not  visualized.   5. The inferior vena cava is normal in size with greater than 50%  respiratory variability, suggesting right atrial pressure of 3 mmHg.   Comparison(s): Changes from prior study are noted. 07/20/2021: LVEF  40-45%, mid-apical septal and anterior hypokinesis.   Left Cardiac Cath and Coronary Angiography 07/20/2021: Left Heart Catheterization 07/20/21:  LV: 128/2, EDP 30 mmHg.  Ao 125/71, mean 96 mmHg.  No pressure gradient across the aortic valve. LVEF, global hypokinesis, anterolateral hypokinesis.  Hand contrast injection only performed due to elevated EDP. RCA: Nondominant, small and normal. LM: Nonexistent. LAD: Separate ostia.  Proximal LAD has a smooth 95 to 99% stenosis with TIMI II flow. Successful direct stenting with a 3.5 x 18 mm Onyx frontier DES, stenosis reduced to 0% with TIMI II to TIMI-3 flow improvement. LCx: Dominant.  Large-caliber vessel.  Gives origin to large OM1, small OM 2 and OM 3 is large.  Has a focal 95% to 99% stenosis.  Direct stenting with 3.5 x 16 mm Synergy XD DES, stenosis reduced to 0% with TIMI-3 to TIMI-3 flow.  Recommendation: Patient has ischemic cardiomyopathy with markedly dilated LV.  She will need guideline directed medical therapy, was started on metoprolol succinate 12.5 mg twice daily in view of underlying reactive airway disease Coreg was not chosen.  She was also started on Entresto 24/26 mg twice daily.  Further management is as per Dr. Doylene Canard.  120 mL contrast utilized.  EKG:  EKG was personally reviewed. 03/09/22: EKG was not ordered.    Recent Labs: 12/02/2021: ALT 18; BUN 24; Creatinine, Ser 1.15; Hemoglobin 13.7; Platelets 263; Potassium 3.6; Sodium 138  Recent Lipid Panel     Component Value Date/Time   CHOL 97 12/08/2021 1534   TRIG 87 12/08/2021 1534   HDL 38 (L) 12/08/2021 1534   CHOLHDL 2.6 12/08/2021 1534   VLDL 17 12/08/2021  [place] 12/08/2021 1534     Risk Assessment/Calculations:          Physical Exam:    VS:  BP 96/60   Pulse 84   Ht 5\' 6"  (1.676 m)   Wt 168 lb (76.2 kg)   SpO2 98%   BMI 27.12 kg/m     Wt Readings from Last 3 Encounters:  09/07/22 168 lb (76.2 kg)  03/09/22 175 lb (79.4 kg)  12/08/21 187 lb (84.8 kg)     GEN: Well nourished, well developed, in no acute distress HEENT: normal Neck: no JVD, carotid bruits, or masses Cardiac: RRR; no murmurs, rubs, or gallops,no edema  Respiratory:  clear to auscultation bilaterally, normal work of breathing GI: soft, nontender, nondistended, + BS MS: no deformity or atrophy Skin: warm and dry, no rash Neuro:  Alert and Oriented x 3, Strength and sensation are intact Psych: euthymic mood, full affect   ASSESSMENT:    1. Coronary artery disease involving native coronary artery of native heart without angina pectoris   2. Medication management   3. Ischemic cardiomyopathy   4. Hyperlipidemia, unspecified hyperlipidemia type    PLAN:    In order of problems listed above:  Chronic Systolic Heart Failure, iCM. - Ischemic CM, in setting of high grade left system CAD as outlined below, now s/p PCI to LAD and LCx - Echo (12/22): EF down to 40-45% (noted to be ~25% by LVG at time of cath, previously 65%). RV normal  - NYHA Class I. Euvolemic on exam - Echo 12/2021 shows EF up to 60-65% with normal RV.  - Continue spiro 12.5 mg daily. CMET today. - Continue Entresto 24-26 mg bid.  - Continue Jardiance 10 mg daily.  - Continue Toprol XL 50 mg daily.  - No need for loop diuretic.  - EF back to normal range, no ICD.  -If blood pressure too low at home, we can pull back on medication if necessary.  Minimally symptomatic.  2. CAD - Recent NSTEMI 07/2021. LHC w/ severe  2VCAD, 95-99% pLAD and 95-99% LCx lesion, both lesions treated w/ PCI + DES - Stable w/o angina.  - ASA, off of Brilinta now (~12/23). - On atorvastatin 80 mg daily-LDL at goal less than 70.  -Participated in cardiac rehab.  Will go ahead and check an LP(a) as well as lipid panel.   3. H/o Pericardial Effusion/ Tamponade, 2/2 Acute Pericarditis  - Diagnosed 3/22, s/p emergent pericardiocentesis  - Completed tx w/ high dose ASA + 3 months of colchicine. - Echoes 12/22 and 5/23 showed no effusion.  No change.  Excellent doing very well.   4. Obesity - Body mass index is 27 kg/m. - Continue current weight loss efforts. - She is on semaglutide.  Prescribed by Dr. Pennie Banter office.  Her BMI is currently 28. 193 to 168 on Wegovy.    5. Snoring - No OSA on home sleep study 2/23.          Follow-up:  6 months  Medication Adjustments/Labs and Tests Ordered: Current medicines are reviewed at length with the patient today.  Concerns regarding medicines are outlined above.  Orders Placed This Encounter  Procedures   CBC   Comprehensive metabolic panel   Lipid panel   Lipoprotein A (LPA)   No orders of the defined types were placed in this encounter.   Patient Instructions  Medication Instructions:  The current medical regimen is effective;  continue present plan and medications.  *  If you need a refill on your cardiac medications before your next appointment, please call your pharmacy*  Lab Work: Please have blood work today (CMP, CBC, Lipid, LPa) If you have labs (blood work) drawn today and your tests are completely normal, you will receive your results only by: MyChart Message (if you have MyChart) OR A paper copy in the mail If you have any lab test that is abnormal or we need to change your treatment, we will call you to review the results.  Follow-Up: At [facility], you and your health needs are our priority.  As part of our continuing mission to provide you with  exceptional heart care, we have created designated Provider Care Teams.  These Care Teams include your primary Cardiologist (physician) and Advanced Practice Providers (APPs -  Physician Assistants and Nurse Practitioners) who all work together to provide you with the care you need, when you need it.  We recommend signing up for the patient portal called "MyChart".  Sign up information is provided on this After Visit Summary.  MyChart is used to connect with patients for Virtual Visits (Telemedicine).  Patients are able to view lab/test results, encounter notes, upcoming appointments, etc.  Non-urgent messages can be sent to your provider as well.   To learn more about what you can do with MyChart, go to [email or web address].    Your next appointment:   6 month(s)  Provider:   Donato Schultz, MD         Signed, Donato Schultz, MD  09/07/2022 2:04 PM    [facility]

## 2022-09-07 NOTE — Patient Instructions (Signed)
Medication Instructions:  The current medical regimen is effective;  continue present plan and medications.  *If you need a refill on your cardiac medications before your next appointment, please call your pharmacy*  Lab Work: Please have blood work today (CMP, CBC, Lipid, LPa) If you have labs (blood work) drawn today and your tests are completely normal, you will receive your results only by: MyChart Message (if you have [place]) OR A paper copy in the mail If you have any lab test that is abnormal or we need to change your treatment, we will call you to review the results.  Follow-Up: At [facility], you and your health needs are our priority.  As part of our continuing mission to provide you with exceptional heart care, we have created designated Provider Care Teams.  These Care Teams include your primary Cardiologist (physician) and Advanced Practice Providers (APPs -  Physician Assistants and Nurse Practitioners) who all work together to provide you with the care you need, when you need it.  We recommend signing up for the patient portal called "MyChart".  Sign up information is provided on this After Visit Summary.  MyChart is used to connect with patients for Virtual Visits (Telemedicine).  Patients are able to view lab/test results, encounter notes, upcoming appointments, etc.  Non-urgent messages can be sent to your provider as well.   To learn more about what you can do with MyChart, go to [email or web address].    Your next appointment:   6 month(s)  Provider:   Candee Furbish, MD

## 2022-09-08 LAB — COMPREHENSIVE METABOLIC PANEL
ALT: 13 IU/L (ref 0–32)
AST: 20 IU/L (ref 0–40)
Albumin/Globulin Ratio: 1.9 (ref 1.2–2.2)
Albumin: 4.3 g/dL (ref 3.8–4.9)
Alkaline Phosphatase: 95 IU/L (ref 44–121)
BUN/Creatinine Ratio: 19 (ref 9–23)
BUN: 22 mg/dL (ref 6–24)
Bilirubin Total: 0.3 mg/dL (ref 0.0–1.2)
CO2: 23 mmol/L (ref 20–29)
Calcium: 9.4 mg/dL (ref 8.7–10.2)
Chloride: 103 mmol/L (ref 96–106)
Creatinine, Ser: 1.17 mg/dL — ABNORMAL HIGH (ref 0.57–1.00)
Globulin, Total: 2.3 g/dL (ref 1.5–4.5)
Glucose: 84 mg/dL (ref 70–99)
Potassium: 5.1 mmol/L (ref 3.5–5.2)
Sodium: 141 mmol/L (ref 134–144)
Total Protein: 6.6 g/dL (ref 6.0–8.5)
eGFR: 54 mL/min/{1.73_m2} — ABNORMAL LOW (ref >59)

## 2022-09-08 LAB — LIPID PANEL
Chol/HDL Ratio: 2.6 ratio (ref 0.0–4.4)
Cholesterol, Total: 113 mg/dL (ref 100–199)
HDL: 44 mg/dL (ref >39)
LDL Chol Calc (NIH): 49 mg/dL (ref 0–99)
Triglycerides: 106 mg/dL (ref 0–149)
VLDL Cholesterol Cal: 20 mg/dL (ref 5–40)

## 2022-09-08 LAB — CBC
Hematocrit: 42 % (ref 34.0–46.6)
Hemoglobin: 14 g/dL (ref 11.1–15.9)
MCH: 29 pg (ref 26.6–33.0)
MCHC: 33.3 g/dL (ref 31.5–35.7)
MCV: 87 fL (ref 79–97)
Platelets: 222 10*3/uL (ref 150–450)
RBC: 4.83 x10E6/uL (ref 3.77–5.28)
RDW: 14.2 % (ref 11.7–15.4)
WBC: 11.1 10*3/uL — ABNORMAL HIGH (ref 3.4–10.8)

## 2022-09-08 LAB — LIPOPROTEIN A (LPA): Lipoprotein (a): 9.6 nmol/L (ref <75.0)

## 2022-09-15 ENCOUNTER — Other Ambulatory Visit (HOSPITAL_COMMUNITY): Payer: Self-pay

## 2022-09-15 ENCOUNTER — Other Ambulatory Visit: Payer: Self-pay

## 2022-09-19 ENCOUNTER — Other Ambulatory Visit (HOSPITAL_COMMUNITY): Payer: Self-pay

## 2022-09-19 MED ORDER — WEGOVY 1.7 MG/0.75ML ~~LOC~~ SOAJ
1.7000 mg | SUBCUTANEOUS | 1 refills | Status: DC
  Filled 2022-09-19 – 2022-10-12 (×2): qty 3, 28d supply, fill #0

## 2022-09-21 ENCOUNTER — Other Ambulatory Visit (HOSPITAL_COMMUNITY): Payer: Self-pay

## 2022-09-26 ENCOUNTER — Other Ambulatory Visit (HOSPITAL_COMMUNITY): Payer: Self-pay

## 2022-10-01 ENCOUNTER — Other Ambulatory Visit (HOSPITAL_COMMUNITY): Payer: Self-pay

## 2022-10-02 ENCOUNTER — Other Ambulatory Visit: Payer: Self-pay

## 2022-10-02 ENCOUNTER — Other Ambulatory Visit (HOSPITAL_COMMUNITY): Payer: Self-pay

## 2022-10-02 MED ORDER — SERTRALINE HCL 100 MG PO TABS
150.0000 mg | ORAL_TABLET | Freq: Every day | ORAL | 1 refills | Status: DC
  Filled 2022-10-12: qty 135, 90d supply, fill #0
  Filled 2023-01-11: qty 135, 90d supply, fill #1

## 2022-10-12 ENCOUNTER — Other Ambulatory Visit: Payer: Self-pay

## 2022-10-12 ENCOUNTER — Other Ambulatory Visit (HOSPITAL_COMMUNITY): Payer: Self-pay

## 2022-10-31 ENCOUNTER — Other Ambulatory Visit (HOSPITAL_COMMUNITY): Payer: Self-pay

## 2022-11-01 ENCOUNTER — Other Ambulatory Visit: Payer: Self-pay

## 2022-12-06 ENCOUNTER — Other Ambulatory Visit (HOSPITAL_COMMUNITY): Payer: Self-pay

## 2022-12-06 LAB — LAB REPORT - SCANNED: EGFR: 60

## 2022-12-27 ENCOUNTER — Other Ambulatory Visit (HOSPITAL_COMMUNITY): Payer: Self-pay

## 2022-12-28 ENCOUNTER — Other Ambulatory Visit (HOSPITAL_COMMUNITY): Payer: Self-pay

## 2022-12-28 ENCOUNTER — Other Ambulatory Visit: Payer: Self-pay

## 2022-12-28 MED ORDER — LEVOCETIRIZINE DIHYDROCHLORIDE 5 MG PO TABS
ORAL_TABLET | ORAL | 5 refills | Status: DC
  Filled 2022-12-28: qty 30, 30d supply, fill #0
  Filled 2023-01-24: qty 30, 30d supply, fill #1
  Filled 2023-03-12: qty 30, 30d supply, fill #2
  Filled 2023-04-11: qty 30, 30d supply, fill #3
  Filled 2023-05-13: qty 30, 30d supply, fill #4
  Filled 2023-06-11: qty 30, 30d supply, fill #5

## 2023-01-01 ENCOUNTER — Other Ambulatory Visit (HOSPITAL_COMMUNITY): Payer: Self-pay

## 2023-01-01 ENCOUNTER — Other Ambulatory Visit: Payer: Self-pay

## 2023-01-02 ENCOUNTER — Other Ambulatory Visit (HOSPITAL_COMMUNITY): Payer: Self-pay

## 2023-01-02 MED ORDER — AZELASTINE-FLUTICASONE 137-50 MCG/ACT NA SUSP
1.0000 | Freq: Two times a day (BID) | NASAL | 3 refills | Status: DC
  Filled 2023-01-02: qty 23, 30d supply, fill #0
  Filled 2023-02-09: qty 23, 30d supply, fill #1
  Filled 2023-03-12: qty 23, 30d supply, fill #2
  Filled 2023-04-11: qty 23, 30d supply, fill #3

## 2023-01-11 ENCOUNTER — Other Ambulatory Visit (HOSPITAL_COMMUNITY): Payer: Self-pay

## 2023-01-12 ENCOUNTER — Other Ambulatory Visit: Payer: Self-pay

## 2023-01-12 ENCOUNTER — Other Ambulatory Visit (HOSPITAL_COMMUNITY): Payer: Self-pay

## 2023-01-12 MED ORDER — TRELEGY ELLIPTA 200-62.5-25 MCG/ACT IN AEPB
INHALATION_SPRAY | RESPIRATORY_TRACT | 3 refills | Status: DC
  Filled 2023-01-12: qty 60, 30d supply, fill #0
  Filled 2023-02-09: qty 60, 30d supply, fill #1
  Filled 2023-03-12: qty 60, 30d supply, fill #2
  Filled 2023-04-11: qty 60, 30d supply, fill #3

## 2023-01-16 ENCOUNTER — Other Ambulatory Visit (HOSPITAL_COMMUNITY): Payer: Self-pay

## 2023-03-02 ENCOUNTER — Other Ambulatory Visit (HOSPITAL_COMMUNITY): Payer: Self-pay

## 2023-03-16 ENCOUNTER — Other Ambulatory Visit (HOSPITAL_COMMUNITY): Payer: Self-pay

## 2023-03-16 ENCOUNTER — Ambulatory Visit: Payer: BC Managed Care – PPO | Attending: Cardiology | Admitting: Cardiology

## 2023-03-16 ENCOUNTER — Encounter: Payer: Self-pay | Admitting: Cardiology

## 2023-03-16 VITALS — BP 100/64 | HR 83 | Ht 66.0 in | Wt 170.0 lb

## 2023-03-16 DIAGNOSIS — I5022 Chronic systolic (congestive) heart failure: Secondary | ICD-10-CM | POA: Diagnosis not present

## 2023-03-16 DIAGNOSIS — I2510 Atherosclerotic heart disease of native coronary artery without angina pectoris: Secondary | ICD-10-CM

## 2023-03-16 DIAGNOSIS — I255 Ischemic cardiomyopathy: Secondary | ICD-10-CM | POA: Diagnosis not present

## 2023-03-16 DIAGNOSIS — E785 Hyperlipidemia, unspecified: Secondary | ICD-10-CM

## 2023-03-16 DIAGNOSIS — I214 Non-ST elevation (NSTEMI) myocardial infarction: Secondary | ICD-10-CM

## 2023-03-16 MED ORDER — PANTOPRAZOLE SODIUM 40 MG PO TBEC
40.0000 mg | DELAYED_RELEASE_TABLET | Freq: Every day | ORAL | 3 refills | Status: DC
  Filled 2023-03-16: qty 90, 90d supply, fill #0
  Filled 2023-06-21: qty 90, 90d supply, fill #1
  Filled 2023-09-24: qty 90, 90d supply, fill #2
  Filled 2023-12-19: qty 90, 90d supply, fill #3

## 2023-03-16 MED ORDER — NITROGLYCERIN 0.4 MG SL SUBL
0.4000 mg | SUBLINGUAL_TABLET | SUBLINGUAL | 3 refills | Status: AC | PRN
  Filled 2023-03-16: qty 25, 8d supply, fill #0

## 2023-03-16 MED ORDER — SACUBITRIL-VALSARTAN 24-26 MG PO TABS
1.0000 | ORAL_TABLET | Freq: Two times a day (BID) | ORAL | 3 refills | Status: DC
  Filled 2023-03-16 – 2023-05-13 (×2): qty 180, 90d supply, fill #0
  Filled 2023-08-10 – 2023-08-16 (×2): qty 180, 90d supply, fill #1
  Filled 2023-11-07: qty 180, 90d supply, fill #2
  Filled 2024-02-05: qty 180, 90d supply, fill #3

## 2023-03-16 MED ORDER — METOPROLOL SUCCINATE ER 50 MG PO TB24
50.0000 mg | ORAL_TABLET | Freq: Every day | ORAL | 3 refills | Status: DC
  Filled 2023-03-16: qty 90, 90d supply, fill #0
  Filled 2023-06-11: qty 90, 90d supply, fill #1
  Filled 2023-09-09: qty 90, 90d supply, fill #2
  Filled 2023-12-07: qty 90, 90d supply, fill #3

## 2023-03-16 MED ORDER — EMPAGLIFLOZIN 10 MG PO TABS
10.0000 mg | ORAL_TABLET | Freq: Every day | ORAL | 3 refills | Status: DC
  Filled 2023-03-16 – 2023-03-27 (×2): qty 90, 90d supply, fill #0
  Filled 2023-06-29: qty 90, 90d supply, fill #1
  Filled 2023-10-01: qty 90, 90d supply, fill #2
  Filled 2023-12-25: qty 90, 90d supply, fill #3

## 2023-03-16 MED ORDER — ATORVASTATIN CALCIUM 80 MG PO TABS
80.0000 mg | ORAL_TABLET | Freq: Every evening | ORAL | 3 refills | Status: DC
  Filled 2023-03-16: qty 90, 90d supply, fill #0
  Filled 2023-06-11: qty 90, 90d supply, fill #1
  Filled 2023-09-09: qty 90, 90d supply, fill #2
  Filled 2023-12-07: qty 90, 90d supply, fill #3

## 2023-03-16 NOTE — Progress Notes (Signed)
Cardiology Office Note:  .   Date:  03/16/2023  ID:  Bonnie Nelson, DOB 08-24-1965, MRN [id number] PCP: [name], MD  [facility] Providers Cardiologist:  Donato Schultz, MD    History of Present Illness: .   Bonnie Nelson is a 57 y.o. female Discussed the use of AI scribe software for clinical note transcription with the patient, who gave verbal consent to proceed.  History of Present Illness   Ms. Bonnie Nelson, a retired Surveyor, quantity, presents for follow-up after a non-ST elevation myocardial infarction, congestive heart failure, and pericarditis. She has a history of severe two-vessel CAD, which was treated with drug-eluting stents in her proximal LAD and left circumflex. Her ejection fraction was initially quite low at 25%, leading to her being placed on [place], Jardiance, and Toprol. She was also on dual antiplatelet therapy with Brilinta and aspirin. Fortunately, her ejection fraction has improved significantly to 60-65%, with a normal right ventricle and no pericardial effusion on her last echocardiogram in 2023. She has a family history of coronary disease, with her grandfather dying from a myocardial infarction at 62, her mother having coronary disease, and her sister having an elevated coronary calcium score. She denies any current chest pain, shortness of breath, fainting, or bleeding, but does report feeling lightheaded if she gets up too quickly or bends over and comes back up.         Studies Reviewed: Marland Kitchen   EKG Interpretation Date/Time:  Friday March 16 2023 09:13:21 EDT Ventricular Rate:  80 PR Interval:  148 QRS Duration:  66 QT Interval:  340 QTC Calculation: 392 R Axis:   74  Text Interpretation: Normal sinus rhythm Low voltage QRS When compared with ECG of 08-Dec-2021 15:38, Nonspecific T wave abnormality now evident in Inferior leads Nonspecific T wave abnormality, improved in Lateral leads Confirmed by Donato Schultz ([id number]) on 03/16/2023 9:32:09 AM     DIAGNOSTIC EKG: Sinus rhythm, 80 beats per minute, nonspecific STT wave changes, no significant change from prior (03/16/2023) Left heart catheterization: Severe two vessel CAD with proximal LAD treated with drug eluting stent, proximal left circumflex treated with drug eluting stent (2022) Echocardiogram: Ejection fraction 60-65%, normal right ventricle, no pericardial effusion (2023)  Risk Assessment/Calculations:            Physical Exam:   VS:  BP 100/64   Pulse 83   Ht 5\' 6"  (1.676 m)   Wt 170 lb (77.1 kg)   SpO2 97%   BMI 27.44 kg/m    Wt Readings from Last 3 Encounters:  03/16/23 170 lb (77.1 kg)  09/07/22 168 lb (76.2 kg)  03/09/22 175 lb (79.4 kg)    GEN: Well nourished, well developed in no acute distress NECK: No JVD; No carotid bruits CARDIAC: RRR, no murmurs, rubs, gallops RESPIRATORY:  Clear to auscultation without rales, wheezing or rhonchi  ABDOMEN: Soft, non-tender, non-distended EXTREMITIES:  No edema; No deformity   ASSESSMENT AND PLAN: .    Assessment and Plan    Chronic Systolic Heart Failure Improved from EF 25% to 60-65% on current regimen of Entresto, Jardiance, Toprol, and Spironolactone. No current symptoms of heart failure. Blood pressure on the lower side with occasional lightheadedness on standing. -Discontinue Spironolactone 12.5mg  daily. -Continue Entresto 24/26mg  twice daily, Metoprolol XL 50mg  daily, and Jardiance 10mg  daily.  Coronary Artery Disease History of non-ST elevation myocardial infarction with two-vessel CAD treated with drug-eluting stents. LDL at goal. No current symptoms of angina. -Continue current  regimen.  General Health Maintenance -Continue [phone] for weight management, pending insurance coverage. -Follow up in one year.              Signed, Donato Schultz, MD

## 2023-03-16 NOTE — Patient Instructions (Signed)
Medication Instructions:  Your physician has recommended you make the following change in your medication: Stop spironolactone  *If you need a refill on your cardiac medications before your next appointment, please call your pharmacy*   Lab Work: none If you have labs (blood work) drawn today and your tests are completely normal, you will receive your results only by: MyChart Message (if you have MyChart) OR A paper copy in the mail If you have any lab test that is abnormal or we need to change your treatment, we will call you to review the results.   Testing/Procedures: none   Follow-Up: At [facility], you and your health needs are our priority.  As part of our continuing mission to provide you with exceptional heart care, we have created designated Provider Care Teams.  These Care Teams include your primary Cardiologist (physician) and Advanced Practice Providers (APPs -  Physician Assistants and Nurse Practitioners) who all work together to provide you with the care you need, when you need it.  We recommend signing up for the patient portal called "MyChart".  Sign up information is provided on this After Visit Summary.  MyChart is used to connect with patients for Virtual Visits (Telemedicine).  Patients are able to view lab/test results, encounter notes, upcoming appointments, etc.  Non-urgent messages can be sent to your provider as well.   To learn more about what you can do with MyChart, go to [email or web address].    Your next appointment:   12 month(s)  Provider:   Donato Schultz, MD     Other Instructions  Monitor blood pressure at home

## 2023-03-19 ENCOUNTER — Other Ambulatory Visit (HOSPITAL_COMMUNITY): Payer: Self-pay

## 2023-03-27 ENCOUNTER — Other Ambulatory Visit (HOSPITAL_COMMUNITY): Payer: Self-pay

## 2023-04-04 ENCOUNTER — Other Ambulatory Visit (HOSPITAL_COMMUNITY): Payer: Self-pay

## 2023-04-11 ENCOUNTER — Other Ambulatory Visit (HOSPITAL_COMMUNITY): Payer: Self-pay

## 2023-04-12 ENCOUNTER — Other Ambulatory Visit: Payer: Self-pay

## 2023-04-12 ENCOUNTER — Other Ambulatory Visit (HOSPITAL_COMMUNITY): Payer: Self-pay

## 2023-04-12 MED ORDER — SERTRALINE HCL 100 MG PO TABS
150.0000 mg | ORAL_TABLET | Freq: Every day | ORAL | 1 refills | Status: DC
  Filled 2023-04-12: qty 135, 90d supply, fill #0
  Filled 2023-07-11: qty 135, 90d supply, fill #1

## 2023-04-12 MED ORDER — MONTELUKAST SODIUM 10 MG PO TABS
10.0000 mg | ORAL_TABLET | Freq: Every day | ORAL | 1 refills | Status: DC
  Filled 2023-04-12: qty 90, 90d supply, fill #0
  Filled 2023-07-11: qty 90, 90d supply, fill #1

## 2023-04-17 ENCOUNTER — Other Ambulatory Visit (HOSPITAL_COMMUNITY): Payer: Self-pay

## 2023-05-13 ENCOUNTER — Other Ambulatory Visit (HOSPITAL_COMMUNITY): Payer: Self-pay

## 2023-05-14 ENCOUNTER — Other Ambulatory Visit: Payer: Self-pay

## 2023-05-14 ENCOUNTER — Other Ambulatory Visit (HOSPITAL_COMMUNITY): Payer: Self-pay

## 2023-05-16 ENCOUNTER — Other Ambulatory Visit (HOSPITAL_COMMUNITY): Payer: Self-pay

## 2023-05-16 MED ORDER — TRELEGY ELLIPTA 200-62.5-25 MCG/ACT IN AEPB
1.0000 | INHALATION_SPRAY | Freq: Every day | RESPIRATORY_TRACT | 5 refills | Status: DC
  Filled 2023-05-16: qty 60, 30d supply, fill #0
  Filled 2023-06-11: qty 60, 30d supply, fill #1
  Filled 2023-07-11: qty 60, 30d supply, fill #2
  Filled 2023-08-10: qty 60, 30d supply, fill #3
  Filled 2023-09-14: qty 60, 30d supply, fill #4
  Filled 2023-10-08: qty 60, 30d supply, fill #5

## 2023-05-16 MED ORDER — LEVALBUTEROL TARTRATE 45 MCG/ACT IN AERO
2.0000 | INHALATION_SPRAY | RESPIRATORY_TRACT | 0 refills | Status: DC | PRN
  Filled 2023-05-16: qty 15, 30d supply, fill #0

## 2023-05-16 MED ORDER — LEVOCETIRIZINE DIHYDROCHLORIDE 5 MG PO TABS
5.0000 mg | ORAL_TABLET | Freq: Every day | ORAL | 1 refills | Status: DC
  Filled 2023-05-16 – 2023-07-11 (×2): qty 30, 30d supply, fill #0
  Filled 2023-08-07: qty 30, 30d supply, fill #1

## 2023-05-16 MED ORDER — AZELASTINE-FLUTICASONE 137-50 MCG/ACT NA SUSP
1.0000 | Freq: Two times a day (BID) | NASAL | 5 refills | Status: DC
  Filled 2023-05-16: qty 23, 30d supply, fill #0
  Filled 2023-06-11: qty 23, 30d supply, fill #1
  Filled 2023-07-11: qty 23, 30d supply, fill #2
  Filled 2023-08-10 – 2023-08-16 (×2): qty 23, 30d supply, fill #3
  Filled 2023-09-14: qty 23, 30d supply, fill #4
  Filled 2023-11-11: qty 23, 30d supply, fill #5

## 2023-05-17 ENCOUNTER — Other Ambulatory Visit (HOSPITAL_COMMUNITY): Payer: Self-pay

## 2023-05-17 MED ORDER — IPRATROPIUM BROMIDE 0.06 % NA SOLN
2.0000 | Freq: Three times a day (TID) | NASAL | 3 refills | Status: DC | PRN
  Filled 2023-05-17: qty 15, 25d supply, fill #0
  Filled 2023-06-21: qty 15, 25d supply, fill #1
  Filled 2023-09-14: qty 15, 25d supply, fill #2

## 2023-05-22 ENCOUNTER — Other Ambulatory Visit (HOSPITAL_COMMUNITY): Payer: Self-pay

## 2023-05-24 ENCOUNTER — Other Ambulatory Visit (HOSPITAL_BASED_OUTPATIENT_CLINIC_OR_DEPARTMENT_OTHER): Payer: Self-pay

## 2023-05-24 MED ORDER — COVID-19 MRNA VAC-TRIS(PFIZER) 30 MCG/0.3ML IM SUSY
0.3000 mL | PREFILLED_SYRINGE | Freq: Once | INTRAMUSCULAR | 0 refills | Status: AC
  Filled 2023-05-24: qty 0.3, 1d supply, fill #0

## 2023-05-24 MED ORDER — INFLUENZA VIRUS VACC SPLIT PF (FLUZONE) 0.5 ML IM SUSY
0.5000 mL | PREFILLED_SYRINGE | Freq: Once | INTRAMUSCULAR | 0 refills | Status: AC
  Filled 2023-05-24: qty 0.5, 1d supply, fill #0

## 2023-06-14 ENCOUNTER — Other Ambulatory Visit (HOSPITAL_COMMUNITY): Payer: Self-pay

## 2023-06-20 ENCOUNTER — Encounter: Payer: Self-pay | Admitting: Cardiology

## 2023-06-21 ENCOUNTER — Other Ambulatory Visit (HOSPITAL_COMMUNITY): Payer: Self-pay

## 2023-06-21 ENCOUNTER — Telehealth: Payer: Self-pay | Admitting: *Deleted

## 2023-06-21 MED ORDER — AMOXICILLIN-POT CLAVULANATE 875-125 MG PO TABS
1.0000 | ORAL_TABLET | Freq: Two times a day (BID) | ORAL | 0 refills | Status: DC
  Filled 2023-06-21: qty 28, 14d supply, fill #0

## 2023-06-21 NOTE — Telephone Encounter (Signed)
Left message to call back to set up tele pre op appt.  

## 2023-06-21 NOTE — Telephone Encounter (Signed)
   Name: Bonnie Nelson  DOB: 07-Mar-1966  MRN: [id number]  Primary Cardiologist: Donato Schultz, MD   Preoperative team, please contact this patient and set up a phone call appointment for further preoperative risk assessment. Please obtain consent and complete medication review. Thank you for your help.  I confirm that guidance regarding antiplatelet and oral anticoagulation therapy has been completed and, if necessary, noted below.  Regarding ASA therapy, we recommend continuation of ASA throughout the perioperative period.  However, if the surgeon feels that cessation of ASA is required in the perioperative period, it may be stopped 5-7 days prior to surgery with a plan to resume it as soon as felt to be feasible from a surgical standpoint in the post-operative period.  I also confirmed the patient resides in the state of [place]. As per [facility] telemedicine laws, the patient must reside in the state in which the provider is licensed.   Joylene Grapes, NP 06/21/2023, 12:51 PM [facility]

## 2023-06-21 NOTE — Telephone Encounter (Signed)
   Pre-operative Risk Assessment    Patient Name: Bonnie Nelson  DOB: Jan 27, 1966 MRN: [id number]  DATE OF LAST VISIT: 03/16/23 DR. SKAINS DATE OF LAST VISIT: NONE    Request for Surgical Clearance    Procedure:   RIGHT SHOULDER ARTHROSCOPY W/RCR  Date of Surgery:  Clearance TBD                                 Surgeon:  DR. Caryn Bee SUPPLE Surgeon's Group or Practice Name:  [facility] Phone number:  [phone] ATTN: Aida Raider Fax number:  [phone]   Type of Clearance Requested:   - Medical  - Pharmacy:  Hold Aspirin     Type of Anesthesia:  General    Additional requests/questions:    [name]   06/21/2023, 10:16 AM

## 2023-06-22 ENCOUNTER — Telehealth: Payer: Self-pay

## 2023-06-22 NOTE — Telephone Encounter (Signed)
Pt returning call

## 2023-06-22 NOTE — Telephone Encounter (Signed)
  Patient Consent for Virtual Visit        Bonnie Nelson has provided verbal consent on 06/22/2023 for a virtual visit (video or telephone).   CONSENT FOR VIRTUAL VISIT FOR:  Bonnie Nelson  By participating in this virtual visit I agree to the following:  I hereby voluntarily request, consent and authorize [facility] and its employed or contracted physicians, physician assistants, nurse practitioners or other licensed health care professionals (the Practitioner), to provide me with telemedicine health care services (the "Services") as deemed necessary by the treating Practitioner. I acknowledge and consent to receive the Services by the Practitioner via telemedicine. I understand that the telemedicine visit will involve communicating with the Practitioner through live audiovisual communication technology and the disclosure of certain medical information by electronic transmission. I acknowledge that I have been given the opportunity to request an in-person assessment or other available alternative prior to the telemedicine visit and am voluntarily participating in the telemedicine visit.  I understand that I have the right to withhold or withdraw my consent to the use of telemedicine in the course of my care at any time, without affecting my right to future care or treatment, and that the Practitioner or I may terminate the telemedicine visit at any time. I understand that I have the right to inspect all information obtained and/or recorded in the course of the telemedicine visit and may receive copies of available information for a reasonable fee.  I understand that some of the potential risks of receiving the Services via telemedicine include:  Delay or interruption in medical evaluation due to technological equipment failure or disruption; Information transmitted may not be sufficient (e.g. poor resolution of images) to allow for appropriate medical decision making by the Practitioner;  and/or  In rare instances, security protocols could fail, causing a breach of personal health information.  Furthermore, I acknowledge that it is my responsibility to provide information about my medical history, conditions and care that is complete and accurate to the best of my ability. I acknowledge that Practitioner's advice, recommendations, and/or decision may be based on factors not within their control, such as incomplete or inaccurate data provided by me or distortions of diagnostic images or specimens that may result from electronic transmissions. I understand that the practice of medicine is not an exact science and that Practitioner makes no warranties or guarantees regarding treatment outcomes. I acknowledge that a copy of this consent can be made available to me via my patient portal [facility] MyChart), or I can request a printed copy by calling the office of  [facility].    I understand that my insurance will be billed for this visit.   I have read or had this consent read to me. I understand the contents of this consent, which adequately explains the benefits and risks of the Services being provided via telemedicine.  I have been provided ample opportunity to ask questions regarding this consent and the Services and have had my questions answered to my satisfaction. I give my informed consent for the services to be provided through the use of telemedicine in my medical care

## 2023-06-22 NOTE — Telephone Encounter (Signed)
Spoke with patient who is agreeable to do a tele visit on 11/21 at 3 pm. Med rec and consent done.

## 2023-06-27 ENCOUNTER — Ambulatory Visit: Payer: BC Managed Care – PPO | Attending: Cardiovascular Disease | Admitting: Cardiology

## 2023-06-27 DIAGNOSIS — Z01810 Encounter for preprocedural cardiovascular examination: Secondary | ICD-10-CM | POA: Diagnosis not present

## 2023-06-27 NOTE — Progress Notes (Signed)
Virtual Visit via Telephone Note   Because of Bonnie Nelson's co-morbid illnesses, she is at least at moderate risk for complications without adequate follow up.  This format is felt to be most appropriate for this patient at this time.  The patient did not have access to video technology/had technical difficulties with video requiring transitioning to audio format only (telephone).  All issues noted in this document were discussed and addressed.  No physical exam could be performed with this format.  Please refer to the patient's chart for her consent to telehealth for [facility].  Evaluation Performed:  Preoperative cardiovascular risk assessment _____________   Date:  06/27/2023   Patient ID:  Bonnie Nelson, DOB 1966/02/12, MRN [id number] Patient Location:  Home Provider location:   Office  Primary Care Provider:  [name], MD Primary Cardiologist:  Donato Schultz, MD  Chief Complaint / Patient Profile   57 y.o. y/o female with a h/o CAD s/p DES to proximal LAD and left circumflex, heart failure with improved EF and pericarditis who is pending right shoulder arthroscopy with RCR and presents today for telephonic preoperative cardiovascular risk assessment.  History of Present Illness    Bonnie Nelson is a 56 y.o. female who presents via audio/video conferencing for a telehealth visit today.  Pt was last seen in cardiology clinic on 03/16/23 by Dr. Anne Fu.  At that time Bonnie Nelson was doing well.  The patient is now pending procedure as outlined above. Since her last visit, she has remained stable from a cardiac perspective. She denies chest pain, palpitations, dyspnea, pnd, orthopnea, n, v, dizziness, syncope, edema, weight gain, or early satiety.  Past Medical History    Past Medical History:  Diagnosis Date   Anxiety    Arthritis    Asthma    CHF (congestive heart failure) (HCC)    Coronary artery disease    Myocardial infarct (HCC)    Pericarditis    Pneumonia     Past Surgical History:  Procedure Laterality Date   achalasia N/A 09/2005   CARDIAC CATHETERIZATION     COLONOSCOPY     DILATION AND CURETTAGE OF UTERUS     KNEE ARTHROSCOPY WITH MEDIAL MENISECTOMY Right 03/22/2021   Procedure: KNEE ARTHROSCOPY WITH MEDIAL AND LATERAL  MENISECTOMY;  Surgeon: Yolonda Kida, MD;  Location: [facility];  Service: Orthopedics;  Laterality: Right;   LEFT HEART CATH AND CORONARY ANGIOGRAPHY N/A 07/20/2021   Procedure: LEFT HEART CATH AND CORONARY ANGIOGRAPHY;  Surgeon: Yates Decamp, MD;  Location: [facility] INV[facility];  Service: Cardiovascular;  Laterality: N/A;   NASAL SEPTUM SURGERY     PERICARDIAL FLUID DRAINAGE  10/03/2019   PERICARDIOCENTESIS N/A 10/03/2019   Procedure: PERICARDIOCENTESIS;  Surgeon: Orpah Cobb, MD;  Location: [facility];  Service: Cardiovascular;  Laterality: N/A;    Allergies  No Known Allergies  Home Medications    Prior to Admission medications   Medication Sig Start Date End Date Taking? Authorizing Provider  acetaminophen (TYLENOL) 500 MG tablet Take 1,000 mg by mouth every 6 (six) hours as needed for mild pain or moderate pain.    [provider]  ALPRAZolam Prudy Feeler) 0.25 MG tablet Take 1 tablet by mouth 3 times a day as neeed for anxiety 03/22/21     amoxicillin-clavulanate (AUGMENTIN) 875-125 MG tablet Take 1 tablet by mouth every 12 (twelve) hours for 14 days. 06/21/23     aspirin EC 81 MG tablet Take 81 mg by  mouth every evening.    [provider]  atorvastatin (LIPITOR) 80 MG tablet Take 1 tablet by mouth nightly 03/16/23   Jake Bathe, MD  Azelastine-Fluticasone 137-50 MCG/ACT SUSP Place 1 spray into both nostrils 2 (two) times daily. 05/16/23     empagliflozin (JARDIANCE) 10 MG TABS tablet Take 1 tablet by mouth daily. 03/16/23   Jake Bathe, MD  fexofenadine (ALLEGRA) 180 MG tablet Take 180 mg by mouth in the morning.    [provider]  Fluticasone-Umeclidin-Vilant (TRELEGY  ELLIPTA) 200-62.5-25 MCG/ACT AEPB Take 1 puff by mouth daily. 05/16/23     ipratropium (ATROVENT) 0.06 % nasal spray Place 2 sprays into both nostrils daily as needed for allergies. 10/26/20   [provider]  ipratropium (ATROVENT) 0.06 % nasal spray Place 2 sprays into both nostrils 3 (three) times daily as needed. 05/16/23     levalbuterol (XOPENEX HFA) 45 MCG/ACT inhaler Inhale 2 puffs into the lungs every 4-6 hours as needed for cough/wheeze. 08/09/21     levalbuterol (XOPENEX HFA) 45 MCG/ACT inhaler Inhale 2 puffs into the lungs every 4-6 hours as needed for cough/wheeze 05/16/23     levocetirizine (XYZAL) 5 MG tablet Take 1 tablet by mouth once daily in the evening 12/27/22     levocetirizine (XYZAL) 5 MG tablet Take 1 tablet (5 mg total) by mouth daily in the evening. 05/16/23     metoprolol succinate (TOPROL-XL) 50 MG 24 hr tablet Take 1 tablet (50 mg) by mouth daily. Take with or immediately following a meal. 03/16/23   Jake Bathe, MD  montelukast (SINGULAIR) 10 MG tablet Take 1 tablet (10 mg total) by mouth daily. 04/12/23     nitroGLYCERIN (NITROSTAT) 0.4 MG SL tablet Place 1 tablet (0.4 mg) under the tongue every 5 minutes x 3 doses as needed for chest pain. 03/16/23   Jake Bathe, MD  pantoprazole (PROTONIX) 40 MG tablet Take 1 tablet (40 mg) by mouth daily. 03/16/23   Jake Bathe, MD  sacubitril-valsartan (ENTRESTO) 24-26 MG Take 1 tablet by mouth 2 (two) times daily. 03/16/23   Jake Bathe, MD  Semaglutide-Weight Management (WEGOVY) 1.7 MG/0.75ML SOAJ Inject 1.7 mg into the skin once a week. Patient taking differently: Inject 1.7 mg into the skin once a week. Patient has been taking once every 2 weeks 09/19/22     sertraline (ZOLOFT) 100 MG tablet Take 1.5 tablets (150 mg total) by mouth daily. 04/12/23       Physical Exam    Vital Signs:  Talah R Mcadoo does not have vital signs available for review today.  Given telephonic nature of communication, physical exam is  limited. AAOx3. NAD. Normal affect.  Speech and respirations are unlabored.  Accessory Clinical Findings   None Assessment & Plan    1.  Preoperative Cardiovascular Risk Assessment: Right shoulder arthroscopy with RCR with Dr. Francena Hanly  Ms. Manocchio's perioperative risk of a major cardiac event is 6.6% according to the Revised Cardiac Risk Index (RCRI).  Therefore, she is at high risk for perioperative complications.   Her functional capacity is good at 8.97 METs according to the Duke Activity Status Index (DASI). Recommendations: According to ACC/AHA guidelines, no further cardiovascular testing needed.  The patient may proceed to surgery at acceptable risk.   Antiplatelet and/or Anticoagulation Recommendations: Regarding ASA therapy, we recommend continuation of ASA throughout the perioperative period. However, if the surgeon feels that cessation of ASA is required in the perioperative period, it  may be stopped 5-7 days prior to surgery with a plan to resume it as soon as felt to be feasible from a surgical standpoint in the post-operative period.    The patient was advised that if she develops new symptoms prior to surgery to contact our office to arrange for a follow-up visit, and she verbalized understanding.  A copy of this note will be routed to requesting surgeon.  Time:   Today, I have spent 7 minutes with the patient with telehealth technology discussing medical history, symptoms, and management plan.    Rip Harbour, NP  06/27/2023, 1:54 PM

## 2023-06-29 ENCOUNTER — Other Ambulatory Visit (HOSPITAL_COMMUNITY): Payer: Self-pay

## 2023-07-03 ENCOUNTER — Other Ambulatory Visit (HOSPITAL_COMMUNITY): Payer: Self-pay

## 2023-07-03 MED ORDER — NAPROXEN 500 MG PO TABS
500.0000 mg | ORAL_TABLET | Freq: Two times a day (BID) | ORAL | 1 refills | Status: DC
  Filled 2023-07-03: qty 60, 30d supply, fill #0
  Filled 2023-10-04 – 2023-10-27 (×3): qty 60, 30d supply, fill #1

## 2023-07-03 MED ORDER — OXYCODONE-ACETAMINOPHEN 5-325 MG PO TABS
1.0000 | ORAL_TABLET | ORAL | 0 refills | Status: AC | PRN
  Filled 2023-07-03: qty 20, 4d supply, fill #0

## 2023-07-03 MED ORDER — ONDANSETRON HCL 4 MG PO TABS
4.0000 mg | ORAL_TABLET | Freq: Four times a day (QID) | ORAL | 0 refills | Status: AC | PRN
  Filled 2023-07-03: qty 10, 3d supply, fill #0

## 2023-07-03 MED ORDER — CYCLOBENZAPRINE HCL 10 MG PO TABS
10.0000 mg | ORAL_TABLET | Freq: Four times a day (QID) | ORAL | 0 refills | Status: DC | PRN
  Filled 2023-07-03: qty 30, 8d supply, fill #0

## 2023-07-11 ENCOUNTER — Other Ambulatory Visit: Payer: Self-pay

## 2023-07-11 ENCOUNTER — Other Ambulatory Visit (HOSPITAL_COMMUNITY): Payer: Self-pay

## 2023-07-13 ENCOUNTER — Other Ambulatory Visit (HOSPITAL_COMMUNITY): Payer: Self-pay

## 2023-07-27 ENCOUNTER — Telehealth: Payer: Self-pay | Admitting: Pharmacy Technician

## 2023-07-27 ENCOUNTER — Other Ambulatory Visit (HOSPITAL_COMMUNITY): Payer: Self-pay

## 2023-07-27 NOTE — Telephone Encounter (Signed)
Pharmacy Patient Advocate Encounter  Received notification from [facility] that Prior Authorization for entresto has been APPROVED from 07/27/23 to 07/25/24   PA #/Case ID/Reference #: [id number]

## 2023-07-27 NOTE — Telephone Encounter (Signed)
Pharmacy Patient Advocate Encounter   Received notification from Cover[employer]Meds that prior authorization for entresto is required/requested.   Insurance verification completed.   The patient is insured through [facility] .   Per test claim: PA required; PA submitted to above mentioned insurance via CoverMyMeds Key/confirmation #/EOC [id number] Status is pending

## 2023-08-03 IMAGING — CT CT ABD-PELV W/ CM
2 of 5 series · 15 of 46 positions shown, 17 images · IV contrast (agent unspecified)
Comparison: None.
COMPARISON: None.

Addendum:
CLINICAL DATA: Abdominal pain, acute, nonlocalized

EXAM:
CT ABDOMEN AND PELVIS WITH CONTRAST
TECHNIQUE: Multidetector CT imaging of the abdomen and pelvis was performed
using the standard protocol following bolus administration of
intravenous contrast.

[Series 2: abd pel w · axial · 0.76mm/px · z∈[-598,-228]mm · 12 of 84 slices shown, 14 images]
[im 5/84  soft-tissue]
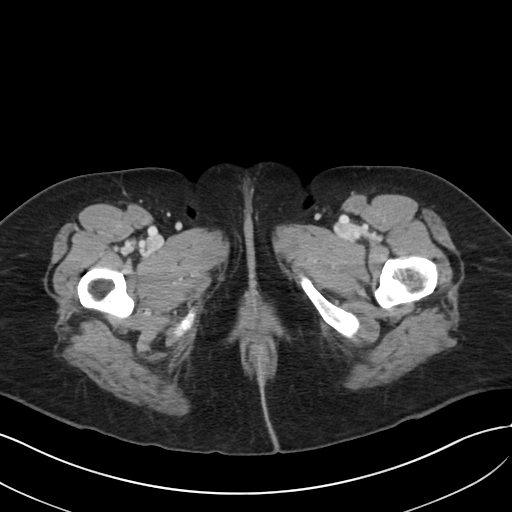
[im 5/84  bone]
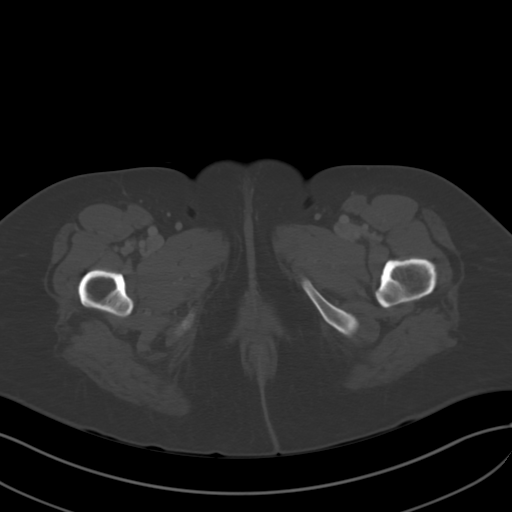
[im 14/84  soft-tissue]
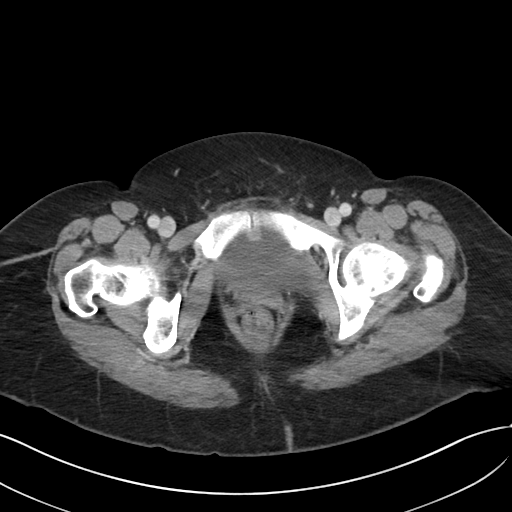
[im 18/84  soft-tissue]
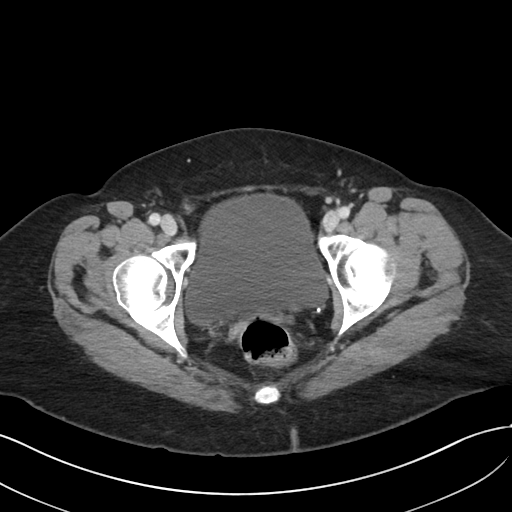
[im 27/84  soft-tissue]
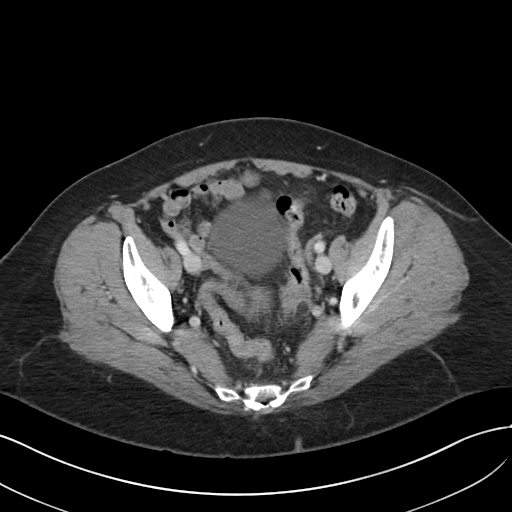
[im 31/84  soft-tissue]
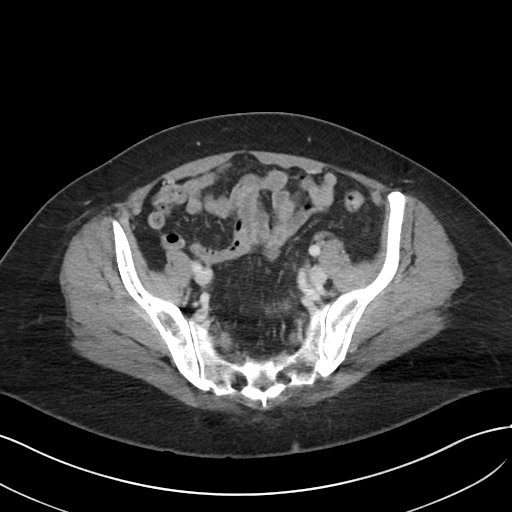
[im 40/84  soft-tissue]
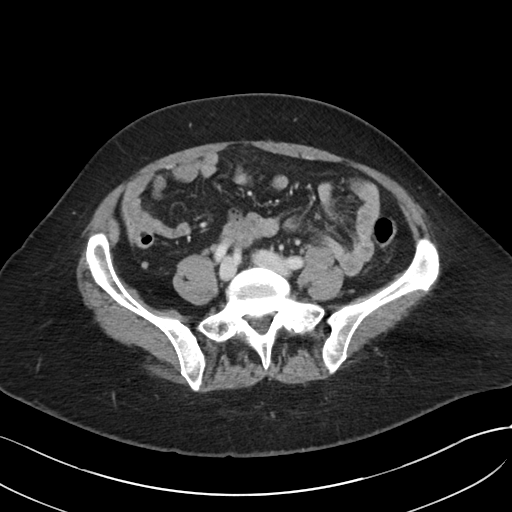
[im 44/84  soft-tissue]
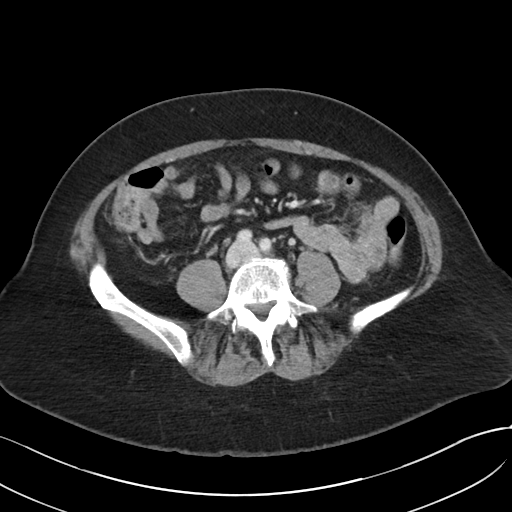
[im 53/84  soft-tissue]
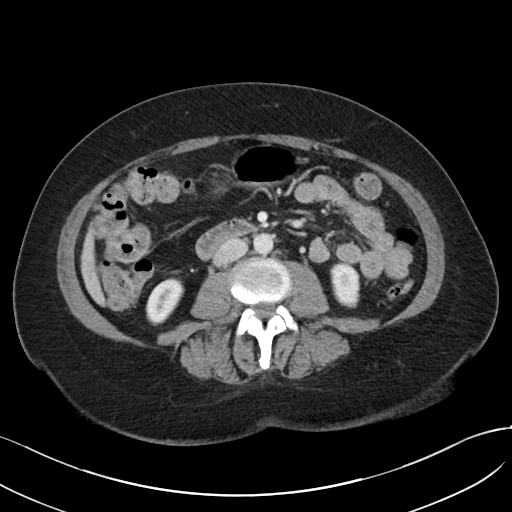
[im 57/84  soft-tissue]
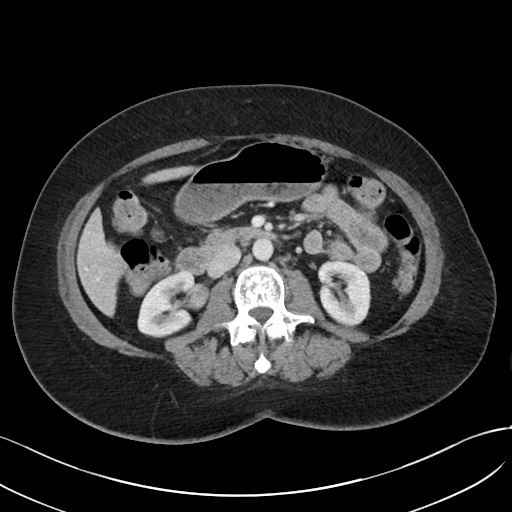
[im 57/84  bone]
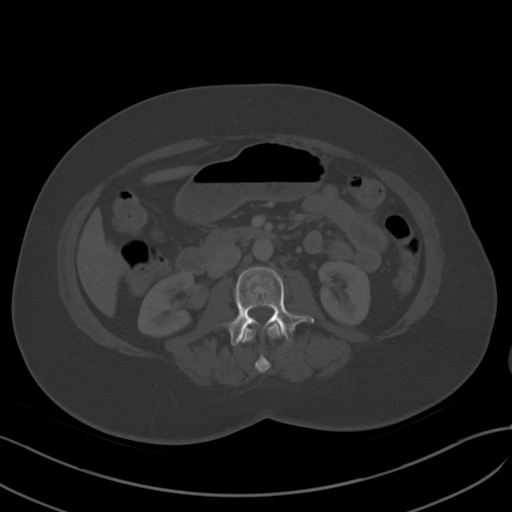
[im 66/84  soft-tissue]
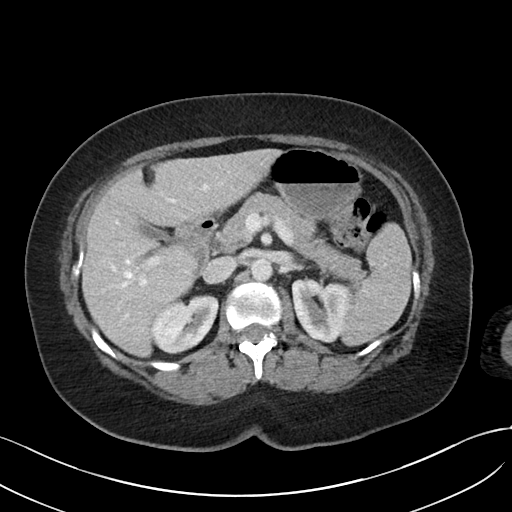
[im 70/84  soft-tissue]
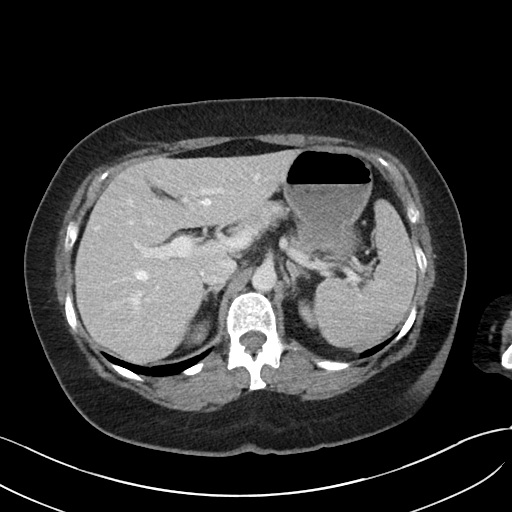
[im 79/84  soft-tissue]
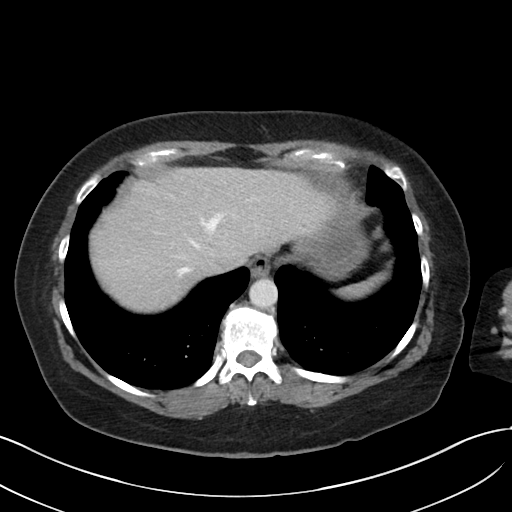

[Series 5: coronal · coronal · 0.87mm/px · 3 of 97 slices shown]
[im 33/97  soft-tissue]
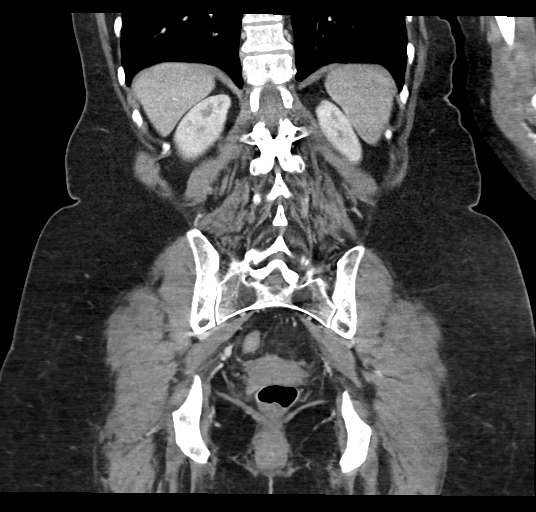
[im 43/97  soft-tissue]
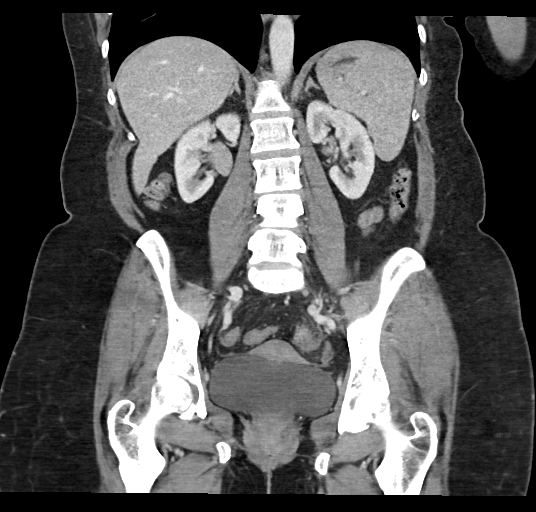
[im 54/97  soft-tissue]
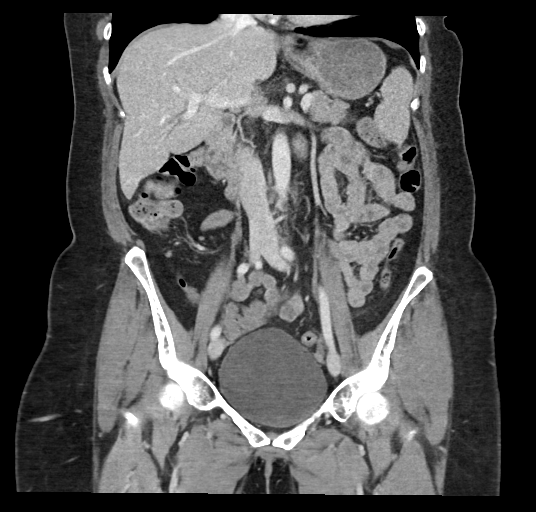

[15 of 46 positions shown; findings below may reference images not displayed]

RADIATION DOSE REDUCTION: This exam was performed according to the
departmental dose-optimization program which includes automated
exposure control, adjustment of the mA and/or kV according to
patient size and/or use of iterative reconstruction technique.

CONTRAST:  100mL OMNIPAQUE IOHEXOL 300 MG/ML  SOLN
FINDINGS: Lower chest: No acute abnormality.

Hepatobiliary: No focal liver abnormality is seen. The gallbladder
is unremarkable.

Pancreas: Unremarkable. No pancreatic ductal dilatation or
surrounding inflammatory changes.

Spleen: Normal in size without focal abnormality.

Adrenals/Urinary Tract: Adrenal glands are unremarkable. No
hydronephrosis or nephrolithiasis. Moderate bladder distension.

Stomach/Bowel: There is focal inflammatory stranding along the
sigmoid colon in the pelvis, likely reflecting acute diverticulitis.
There is trace free fluid in the pelvis, with focal collection
measuring 1.8 x 1.0 x 1.9cm in the right lower pelvis (series 2,
image 61, series 5, image 28). No bowel obstruction. The appendix is
normal.

Vascular/Lymphatic: No significant vascular findings are present. No
enlarged abdominal or pelvic lymph nodes.

Reproductive: Unremarkable.

Other: No abdominal wall hernia or abnormality. No abdominopelvic
ascites.

Musculoskeletal: No acute or significant osseous findings.
IMPRESSION: Acute diverticulitis of the sigmoid colon in the pelvis. Trace
pelvic free fluid with focal collection measuring 1.8 x 1.0 x 1.9 cm
in the right lower pelvis.

ADDENDUM:
Discussed case with ER provider: The focal collection in the right
lower hemipelvis may be unrelated to the focal inflammatory process
involving the sigmoid colon, as this is on the opposite side of the
pelvis, and may reflect simple unrelated pelvic free fluid.
Regardless, due to size, this small collection is likely not
drainable. Recommend close clinical attention during treatment and
repeat imaging as clinically indicated.

*** End of Addendum ***
RADIATION DOSE REDUCTION: This exam was performed according to the
departmental dose-optimization program which includes automated
exposure control, adjustment of the mA and/or kV according to
patient size and/or use of iterative reconstruction technique.

CONTRAST:  100mL OMNIPAQUE IOHEXOL 300 MG/ML  SOLN
FINDINGS: Lower chest: No acute abnormality.

Hepatobiliary: No focal liver abnormality is seen. The gallbladder
is unremarkable.

Pancreas: Unremarkable. No pancreatic ductal dilatation or
surrounding inflammatory changes.

Spleen: Normal in size without focal abnormality.

Adrenals/Urinary Tract: Adrenal glands are unremarkable. No
hydronephrosis or nephrolithiasis. Moderate bladder distension.

Stomach/Bowel: There is focal inflammatory stranding along the
sigmoid colon in the pelvis, likely reflecting acute diverticulitis.
There is trace free fluid in the pelvis, with focal collection
measuring 1.8 x 1.0 x 1.9cm in the right lower pelvis (series 2,
image 61, series 5, image 28). No bowel obstruction. The appendix is
normal.

Vascular/Lymphatic: No significant vascular findings are present. No
enlarged abdominal or pelvic lymph nodes.

Reproductive: Unremarkable.

Other: No abdominal wall hernia or abnormality. No abdominopelvic
ascites.

Musculoskeletal: No acute or significant osseous findings.
IMPRESSION: Acute diverticulitis of the sigmoid colon in the pelvis. Trace
pelvic free fluid with focal collection measuring 1.8 x 1.0 x 1.9 cm
in the right lower pelvis.

## 2023-08-11 ENCOUNTER — Other Ambulatory Visit (HOSPITAL_COMMUNITY): Payer: Self-pay

## 2023-08-16 ENCOUNTER — Other Ambulatory Visit (HOSPITAL_COMMUNITY): Payer: Self-pay

## 2023-08-26 ENCOUNTER — Encounter: Payer: Self-pay | Admitting: Cardiology

## 2023-08-28 ENCOUNTER — Other Ambulatory Visit (HOSPITAL_COMMUNITY): Payer: Self-pay

## 2023-09-14 ENCOUNTER — Other Ambulatory Visit: Payer: Self-pay

## 2023-09-14 ENCOUNTER — Other Ambulatory Visit (HOSPITAL_COMMUNITY): Payer: Self-pay

## 2023-09-14 MED ORDER — LEVOCETIRIZINE DIHYDROCHLORIDE 5 MG PO TABS
5.0000 mg | ORAL_TABLET | Freq: Every evening | ORAL | 6 refills | Status: DC
  Filled 2023-09-14: qty 30, 30d supply, fill #0

## 2023-09-24 ENCOUNTER — Other Ambulatory Visit (HOSPITAL_COMMUNITY): Payer: Self-pay

## 2023-09-24 MED ORDER — AMOXICILLIN-POT CLAVULANATE 875-125 MG PO TABS
1.0000 | ORAL_TABLET | Freq: Two times a day (BID) | ORAL | 0 refills | Status: DC
  Filled 2023-09-24 – 2023-09-25 (×3): qty 28, 14d supply, fill #0

## 2023-09-25 ENCOUNTER — Other Ambulatory Visit (HOSPITAL_COMMUNITY): Payer: Self-pay

## 2023-09-25 ENCOUNTER — Other Ambulatory Visit: Payer: Self-pay

## 2023-10-01 ENCOUNTER — Other Ambulatory Visit (HOSPITAL_COMMUNITY): Payer: Self-pay

## 2023-10-01 ENCOUNTER — Other Ambulatory Visit: Payer: Self-pay

## 2023-10-01 ENCOUNTER — Other Ambulatory Visit (HOSPITAL_BASED_OUTPATIENT_CLINIC_OR_DEPARTMENT_OTHER): Payer: Self-pay

## 2023-10-01 MED ORDER — LEVOCETIRIZINE DIHYDROCHLORIDE 5 MG PO TABS
5.0000 mg | ORAL_TABLET | Freq: Every evening | ORAL | 5 refills | Status: DC
  Filled 2023-10-01 – 2023-10-08 (×2): qty 30, 30d supply, fill #0
  Filled 2023-11-07: qty 30, 30d supply, fill #1
  Filled 2023-12-07: qty 30, 30d supply, fill #2
  Filled 2024-01-07: qty 30, 30d supply, fill #3
  Filled 2024-02-06 – 2024-02-16 (×2): qty 30, 30d supply, fill #4

## 2023-10-02 ENCOUNTER — Other Ambulatory Visit (HOSPITAL_COMMUNITY): Payer: Self-pay

## 2023-10-03 ENCOUNTER — Other Ambulatory Visit (HOSPITAL_COMMUNITY): Payer: Self-pay

## 2023-10-08 ENCOUNTER — Other Ambulatory Visit (HOSPITAL_COMMUNITY): Payer: Self-pay

## 2023-10-08 ENCOUNTER — Other Ambulatory Visit: Payer: Self-pay

## 2023-10-08 MED ORDER — SERTRALINE HCL 100 MG PO TABS
150.0000 mg | ORAL_TABLET | Freq: Every day | ORAL | 1 refills | Status: DC
  Filled 2023-10-08: qty 135, 90d supply, fill #0
  Filled 2024-01-07: qty 135, 90d supply, fill #1

## 2023-10-08 MED ORDER — MONTELUKAST SODIUM 10 MG PO TABS
10.0000 mg | ORAL_TABLET | Freq: Every day | ORAL | 3 refills | Status: DC
  Filled 2023-10-08: qty 90, 90d supply, fill #0
  Filled 2024-01-07: qty 90, 90d supply, fill #1

## 2023-10-15 ENCOUNTER — Other Ambulatory Visit (HOSPITAL_COMMUNITY): Payer: Self-pay

## 2023-10-26 ENCOUNTER — Other Ambulatory Visit (HOSPITAL_COMMUNITY): Payer: Self-pay

## 2023-10-31 ENCOUNTER — Other Ambulatory Visit (HOSPITAL_COMMUNITY): Payer: Self-pay

## 2023-11-21 ENCOUNTER — Other Ambulatory Visit (HOSPITAL_COMMUNITY): Payer: Self-pay

## 2023-11-21 MED ORDER — MUPIROCIN 2 % EX OINT
TOPICAL_OINTMENT | CUTANEOUS | 0 refills | Status: AC
  Filled 2023-11-21: qty 22, 5d supply, fill #0

## 2023-11-22 ENCOUNTER — Other Ambulatory Visit (HOSPITAL_COMMUNITY): Payer: Self-pay

## 2023-11-23 ENCOUNTER — Other Ambulatory Visit (HOSPITAL_COMMUNITY): Payer: Self-pay

## 2023-11-23 MED ORDER — CEPHALEXIN 500 MG PO CAPS
500.0000 mg | ORAL_CAPSULE | Freq: Two times a day (BID) | ORAL | 0 refills | Status: DC
Start: 2023-11-23 — End: 2024-04-14
  Filled 2023-11-23: qty 6, 3d supply, fill #0

## 2023-11-26 ENCOUNTER — Other Ambulatory Visit (HOSPITAL_COMMUNITY): Payer: Self-pay

## 2023-11-26 MED ORDER — TRELEGY ELLIPTA 100-62.5-25 MCG/ACT IN AEPB
INHALATION_SPRAY | RESPIRATORY_TRACT | 3 refills | Status: AC
  Filled 2023-11-26: qty 60, 30d supply, fill #0
  Filled 2023-12-27: qty 60, 30d supply, fill #1
  Filled 2024-01-30: qty 60, 30d supply, fill #2
  Filled 2024-02-29: qty 60, 30d supply, fill #3
  Filled 2024-03-31: qty 60, 30d supply, fill #4
  Filled 2024-05-02: qty 60, 30d supply, fill #5
  Filled 2024-06-01: qty 60, 30d supply, fill #6
  Filled 2024-07-01: qty 60, 30d supply, fill #7
  Filled 2024-07-29 – 2024-09-01 (×4): qty 60, 30d supply, fill #8

## 2023-11-27 ENCOUNTER — Other Ambulatory Visit: Payer: Self-pay | Admitting: Nephrology

## 2023-11-27 DIAGNOSIS — R399 Unspecified symptoms and signs involving the genitourinary system: Secondary | ICD-10-CM

## 2023-11-27 LAB — LAB REPORT - SCANNED
Creatinine, POC: 17.3 mg/dL
EGFR: 59

## 2023-11-30 ENCOUNTER — Ambulatory Visit
Admission: RE | Admit: 2023-11-30 | Discharge: 2023-11-30 | Disposition: A | Discharge status: Home / Self Care | Source: Ambulatory Visit | Attending: Nephrology | Admitting: Nephrology

## 2023-11-30 DIAGNOSIS — R399 Unspecified symptoms and signs involving the genitourinary system: Secondary | ICD-10-CM

## 2023-12-04 ENCOUNTER — Other Ambulatory Visit (HOSPITAL_COMMUNITY): Payer: Self-pay

## 2023-12-06 ENCOUNTER — Other Ambulatory Visit (HOSPITAL_COMMUNITY): Payer: Self-pay

## 2023-12-06 MED ORDER — AZELASTINE-FLUTICASONE 137-50 MCG/ACT NA SUSP
NASAL | 5 refills | Status: AC
Start: 2023-12-06 — End: ?
  Filled 2023-12-06: qty 23, 30d supply, fill #0

## 2024-01-07 ENCOUNTER — Other Ambulatory Visit (HOSPITAL_COMMUNITY): Payer: Self-pay

## 2024-01-30 ENCOUNTER — Other Ambulatory Visit (HOSPITAL_COMMUNITY): Payer: Self-pay

## 2024-02-06 ENCOUNTER — Other Ambulatory Visit (HOSPITAL_COMMUNITY): Payer: Self-pay

## 2024-02-15 ENCOUNTER — Other Ambulatory Visit (HOSPITAL_COMMUNITY): Payer: Self-pay

## 2024-02-16 ENCOUNTER — Other Ambulatory Visit (HOSPITAL_COMMUNITY): Payer: Self-pay

## 2024-02-18 ENCOUNTER — Other Ambulatory Visit (HOSPITAL_COMMUNITY): Payer: Self-pay

## 2024-02-18 MED ORDER — MONTELUKAST SODIUM 10 MG PO TABS
10.0000 mg | ORAL_TABLET | Freq: Every day | ORAL | 3 refills | Status: AC
  Filled 2024-02-18 – 2024-04-07 (×2): qty 90, 90d supply, fill #0
  Filled 2024-07-05: qty 90, 90d supply, fill #1

## 2024-03-04 ENCOUNTER — Other Ambulatory Visit: Payer: Self-pay | Admitting: Cardiology

## 2024-03-06 ENCOUNTER — Other Ambulatory Visit (HOSPITAL_COMMUNITY): Payer: Self-pay

## 2024-03-06 MED ORDER — ATORVASTATIN CALCIUM 80 MG PO TABS
80.0000 mg | ORAL_TABLET | Freq: Every evening | ORAL | 0 refills | Status: DC
  Filled 2024-03-06: qty 90, 90d supply, fill #0

## 2024-03-06 MED ORDER — METOPROLOL SUCCINATE ER 50 MG PO TB24
50.0000 mg | ORAL_TABLET | Freq: Every day | ORAL | 0 refills | Status: DC
  Filled 2024-03-06: qty 90, 90d supply, fill #0

## 2024-03-17 LAB — LAB REPORT - SCANNED: EGFR: 51

## 2024-03-18 ENCOUNTER — Other Ambulatory Visit: Payer: Self-pay | Admitting: Cardiology

## 2024-03-20 ENCOUNTER — Other Ambulatory Visit (HOSPITAL_COMMUNITY): Payer: Self-pay

## 2024-03-20 MED ORDER — PANTOPRAZOLE SODIUM 40 MG PO TBEC
40.0000 mg | DELAYED_RELEASE_TABLET | Freq: Every day | ORAL | 0 refills | Status: DC
  Filled 2024-03-20: qty 30, 30d supply, fill #0

## 2024-03-27 ENCOUNTER — Other Ambulatory Visit (HOSPITAL_COMMUNITY): Payer: Self-pay

## 2024-03-31 ENCOUNTER — Telehealth: Payer: Self-pay

## 2024-03-31 NOTE — Telephone Encounter (Signed)
   Primary Cardiologist: Oneil Parchment, MD  Chart reviewed as part of pre-operative protocol coverage. Simple dental extractions (1-2 teeth) and cleanings are considered low risk procedures per guidelines and generally do not require any specific cardiac clearance. It is also generally accepted that for simple extractions and dental cleanings, there is no need to interrupt blood thinner therapy.   SBE prophylaxis is not required for the patient.  I will route this recommendation to the requesting party via Epic fax function and remove from pre-op pool.  Please call with questions.  Rosaline EMERSON Bane, NP-C  03/31/2024, 4:52 PM [address], [address] [place], [place] [address] Office [phone] Fax [phone]

## 2024-03-31 NOTE — Telephone Encounter (Signed)
   Pre-operative Risk Assessment    Patient Name: Bonnie Nelson  DOB: 01-Jun-1966 MRN: [id number]   Date of last office visit: 03/16/23 ONEIL PARCHMENT, MD Date of next office visit: NONE  Request for Surgical Clearance    Procedure:  Dental Extraction - Amount of Teeth to be Pulled:  1 TOOTH   Date of Surgery:  Clearance TBD                                Surgeon:  DR SELINDA JACOBSEN, D.D.S Surgeon's Group or Practice Name:  [facility] Phone number:  [phone] Fax number:  [phone]   Type of Clearance Requested:   - Medical  - Pharmacy:  Hold Aspirin      Type of Anesthesia:  LIGHT IV SEDATION   Additional requests/questions:    Signed, Lucie DELENA Ku   03/31/2024, 4:06 PM

## 2024-04-01 NOTE — Telephone Encounter (Signed)
 Spoke with Dr Dorthula regarding preop clearance. He stated he needs confirmation that pt CHF is stable. He asked for this information be faxed to his office or call him directly at [phone].

## 2024-04-01 NOTE — Telephone Encounter (Signed)
 Primary Cardiologist:Mark Jeffrie, MD  Chart reviewed as part of pre-operative protocol coverage. Because of Bonnie Nelson's past medical history and time since last visit, he/she will require a follow-up visit in order to better assess preoperative cardiovascular risk.  Per Dr. Buddy request, he needs documentation that patient's heart failure is stable, therefore an in person appointment is necessary as she was last seen 03/16/2023.   Pre-op covering staff: - Please schedule appointment and call patient to inform them. - Please contact requesting surgeon's office via preferred method (i.e, phone, fax) to inform them of need for appointment prior to surgery.  Per office protocol and pending no concerning cardiac symptoms at the time of the visit, she may hold aspirin  for 5-7 days prior to procedure and should resume as soon as hemodynamically stable postoperatively.   Rosaline EMERSON Bane, NP-C  04/01/2024, 11:20 AM [address] [place], [place] [address] Office [phone] Fax [phone]

## 2024-04-01 NOTE — Telephone Encounter (Signed)
 The dentist is calling because he still needs clarification on the clearance. Please advise

## 2024-04-01 NOTE — Telephone Encounter (Signed)
 Tried contacting patient to schedule IN OFFICE visit no answer wasn't able to leave vm mailbox full will try again at a later time

## 2024-04-03 ENCOUNTER — Encounter: Payer: Self-pay | Admitting: Cardiology

## 2024-04-03 DIAGNOSIS — I2510 Atherosclerotic heart disease of native coronary artery without angina pectoris: Secondary | ICD-10-CM

## 2024-04-07 ENCOUNTER — Other Ambulatory Visit (HOSPITAL_COMMUNITY): Payer: Self-pay

## 2024-04-07 NOTE — Telephone Encounter (Signed)
 Please see the MyChart message reply(ies) for my assessment and plan.    I would be comfortable prescribing Ozempic  for you. CAD or coronary artery disease is a possible indication. Let's let Melissa with the pharmacy team check in on this for you.   This patient gave consent for this Medical Advice Message and is aware that it may result in a bill to [employer], as well as the possibility of receiving a bill for a co-payment or deductible. They are an established patient, but are not seeking medical advice exclusively about a problem treated during an in person or video visit in the last seven days. I did not recommend an in person or video visit within seven days of my reply.    I spent a total of 5 minutes cumulative time within 7 days through [employer].  Oneil Parchment, MD

## 2024-04-08 ENCOUNTER — Other Ambulatory Visit (HOSPITAL_COMMUNITY): Payer: Self-pay

## 2024-04-08 ENCOUNTER — Other Ambulatory Visit: Payer: Self-pay

## 2024-04-08 ENCOUNTER — Other Ambulatory Visit (HOSPITAL_BASED_OUTPATIENT_CLINIC_OR_DEPARTMENT_OTHER): Payer: Self-pay

## 2024-04-08 MED ORDER — OZEMPIC (2 MG/DOSE) 8 MG/3ML ~~LOC~~ SOPN
2.0000 mg | PEN_INJECTOR | SUBCUTANEOUS | 11 refills | Status: AC
  Filled 2024-04-08: qty 3, 28d supply, fill #0
  Filled 2024-04-30 – 2024-07-09 (×6): qty 3, 28d supply, fill #1
  Filled 2024-09-11: qty 3, 28d supply, fill #2

## 2024-04-08 MED ORDER — BD PEN NEEDLE MINI ULTRAFINE 31G X 5 MM MISC
1.0000 | 1 refills | Status: AC
  Filled 2024-04-08: qty 100, 100d supply, fill #0
  Filled 2024-07-11 – 2024-08-22 (×5): qty 100, 100d supply, fill #1

## 2024-04-08 MED ORDER — SERTRALINE HCL 100 MG PO TABS
150.0000 mg | ORAL_TABLET | Freq: Every day | ORAL | 1 refills | Status: AC
  Filled 2024-04-08: qty 135, 90d supply, fill #0
  Filled 2024-07-05: qty 135, 90d supply, fill #1

## 2024-04-08 NOTE — Telephone Encounter (Signed)
 S/W pt and scheduled IN OFFICE Preop appt 04/14/24 with Rosaline Bane, NP  Will update the surgeons office.

## 2024-04-11 ENCOUNTER — Encounter (HOSPITAL_BASED_OUTPATIENT_CLINIC_OR_DEPARTMENT_OTHER): Payer: Self-pay

## 2024-04-13 NOTE — Progress Notes (Unsigned)
 Cardiology Office Note   Date:  04/14/2024  ID:  Bonnie Nelson, DOB 1965-11-30, MRN [id number] PCP: Clarice Nottingham, MD   HeartCare Providers Cardiologist:  Oneil Parchment, MD     PMH CAD NSTEMI 07/21/2019 >>[facility]   S/p DES to prox LAD and left circumflex HFrEF with EF 25% at time of NSTEMI, normalized to 60-65% 12/2021 Pericardial effusion  S/p NSTEMI with 2 vessel CAD treated with drug-eluting stents in prox LAD and left circumflex, DAPT with Brilinta  and ASA. EF quite low at 25%, treated with GDMT including Entresto , Jardiance , Toprol . EF normalized on echo 12/2021 to 60-65%. Family history of CAD with grandfather who died from MI at age 58, her mother with CAD and her sister with elevated CAC.   Last cardiology clinic visit was 03/16/23 with Dr. Parchment at which time she did not have any concerning cardiac symptoms.BP was soft at 100/64.  Spironolactone  was discontinued. She was on Wegovy  for weight loss management and LDL was well controlled.   History of Present Illness Discussed the use of AI scribe software for clinical note transcription with the patient, who gave verbal consent to proceed.  History of Present Illness Bonnie Nelson is a very pleasant 58 year old female who is here today for preoperative cardiac evaluation and follow-up of CAD and HFrEF.  She is planning to undergo oral surgery and surgeon wanted to ensure that heart failure symptoms were stable given that she had not been seen in a year.  Reports she is feeling well, other than a recent URI that she is now recovering from. She is wearing a mask today. She walks 40-60 minutes 5 days per week. She denies chest pain, shortness of breath, palpitations, orthopnea, PND, edema, presyncope or syncope. She manages her weight postmenopausally, previously using Wegovy  and now on Ozempic  due to insurance issues, achieving a 20-pound weight loss. She reports weight loss is not significant, even when she consistently consumes less than  1500 calories daily. She plans to resume weightlifting to build muscle mass. Her family history includes heart disease, with her grandfather dying in his mid-fifties. She takes pantoprazole  for gastrointestinal issues she felt were exacerbated by anti-platelet meds. She is borderline for chronic kidney disease and sees nephrology annually. She is staying hydrated and monitoring protein intake. No history of diabetes.    ROS: See HPI  Studies Reviewed EKG Interpretation Date/Time:  Monday April 14 2024 09:12:46 EDT Ventricular Rate:  73 PR Interval:  150 QRS Duration:  62 QT Interval:  360 QTC Calculation: 396 R Axis:   32  Text Interpretation: Normal sinus rhythm Low voltage QRS Septal infarct , age undetermined When compared with ECG of 16-Mar-2023 09:13, Non-specific change in ST segment in Anterior leads Confirmed by Percy Browning [phone]) on 04/14/2024 9:28:34 AM     Lipoprotein (a)  Date/Time Value Ref Range Status  09/07/2022 02:21 PM 9.6 <75.0 nmol/L Final    Comment:    Note:  Values greater than or equal to 75.0 nmol/L may        indicate an independent risk factor for CHD,        but must be evaluated with caution when applied        to non-Caucasian populations due to the        influence of genetic factors on Lp(a) across        ethnicities.     Risk Assessment/Calculations           Physical Exam  VS:  BP 114/60 (BP Location: Right Arm, Patient Position: Sitting, Cuff Size: Normal)   Pulse 73   Ht 5' 6 (1.676 m)   Wt 190 lb (86.2 kg)   SpO2 96%   BMI 30.67 kg/m    Wt Readings from Last 3 Encounters:  04/14/24 190 lb (86.2 kg)  03/16/23 170 lb (77.1 kg)  09/07/22 168 lb (76.2 kg)    GEN: Well nourished, well developed in no acute distress NECK: No JVD; No carotid bruits CARDIAC: RRR, no murmurs, rubs, gallops RESPIRATORY:  Clear to auscultation without rales, wheezing or rhonchi  ABDOMEN: Soft, non-tender, non-distended EXTREMITIES:  No edema; No  deformity   Assessment & Plan Preprocedural cardiovascular evaluation  According to the Revised Cardiac Risk Index (RCRI), her Perioperative Risk of Major Cardiac Event is (%): 6.6 based on history of ischemic heart disease and heart failure, however she is medically optimized. Her Functional Capacity in METs is: 8.23 according to the Duke Activity Status Index (DASI). The patient is doing well from a cardiac perspective. Therefore, based on ACC/AHA guidelines, the patient would be at acceptable risk for the planned procedure without further cardiovascular testing. Ideally aspirin  should be continued without interruption, however if the bleeding risk is too great, aspirin  may be held for 5-7 days prior to surgery. Please resume aspirin  post operatively when it is felt to be safe from a bleeding standpoint. I will forward clearance to requesting provider  Chronic HFrEF with recovered EF/Ischemic cardiomyopathy Depressed EF of 40-45% on echo at time of MI in 2020 (thought to be 20-25% on cath) with normalization of EF to 60-65% on TTE 12/2021.  She maintains high physical activity without symptoms of chest pain, or shortness of breath.  No orthopnea, PND, or edema.  Weight is stable, although she continues on GLP-1 agonist with desire for additional weight loss. No evidence of volume overload on exam.  She is doing well on GDMT and requires refills today.  Renal function and electrolytes stable on labs completed 03/17/24.   -Continue empagliflozin , metoprolol , Entresto , semaglutide  -Continue active lifestyle   CAD Hyperlipidemia LDL goal < 70  NSTEMI 07/20/2021 with proximal LAD stenosis 95 to 99%, successfully treated with 3.5 x 18 mm DES and focal stenosis of LCx 95 to 99% successfully treated with 3.5 x 16mm DES. She denies chest pain, dyspnea, or other symptoms concerning for angina. EKG today reveals prior septal infarct, no significant ST/T abnormality.  No indication for further ischemic evaluation  at this time.  Lipid panel completed 03/17/2024 with total cholesterol 122, HDL 51, LDL 51, and triglycerides 887. Lipids are well controlled. -Continue aspirin , atorvastatin , metoprolol , semaglutide  -Maintains active lifestyle aiming for at least 150 minutes of moderate intensity exercise each week -Aim to incorporate weight lifting and resistance training for at least 20-30 minutes 3 days per week   Chronic kidney disease, unspecified stage   Borderline chronic kidney disease per her report. She is followed by nephrology.   Obesity   Obesity is managed with GLP-1 agonist therapy. She has successfully lost weight with Wegovy  but had to switch to Ozempic  due to insurance. She is motivated to continue weight management. -Maintain active lifestyle -Aim to incorporate regular weight lifting -Eat a high protein, high fiber diet avoiding saturated fat, processed foods, sugar and other simple carbohydrates  Gastroesophageal reflux disease   Has been on pantoprazole  long-term for gastroesophageal reflux disease exacerbated in the past by DAPT. History of hiatal hernia repair. We discussed potential negative side  effects of long-term PPI.  She would like to wean off to see how she feels. -Gradually decrease pantoprazole  from once daily to every other day for 2 weeks and then as needed -Consider enteric-coated aspirin  to reduce gastrointestinal side effects        Dispo: 1 year with Dr. Jeffrie  Signed, Rosaline Bane, NP-C

## 2024-04-14 ENCOUNTER — Encounter (HOSPITAL_BASED_OUTPATIENT_CLINIC_OR_DEPARTMENT_OTHER): Payer: Self-pay | Admitting: Nurse Practitioner

## 2024-04-14 ENCOUNTER — Other Ambulatory Visit: Payer: Self-pay

## 2024-04-14 ENCOUNTER — Other Ambulatory Visit (HOSPITAL_COMMUNITY): Payer: Self-pay

## 2024-04-14 ENCOUNTER — Ambulatory Visit (HOSPITAL_BASED_OUTPATIENT_CLINIC_OR_DEPARTMENT_OTHER): Admitting: Nurse Practitioner

## 2024-04-14 VITALS — BP 114/60 | HR 73 | Ht 66.0 in | Wt 190.0 lb

## 2024-04-14 DIAGNOSIS — E6609 Other obesity due to excess calories: Secondary | ICD-10-CM

## 2024-04-14 DIAGNOSIS — I2510 Atherosclerotic heart disease of native coronary artery without angina pectoris: Secondary | ICD-10-CM

## 2024-04-14 DIAGNOSIS — E785 Hyperlipidemia, unspecified: Secondary | ICD-10-CM | POA: Diagnosis not present

## 2024-04-14 DIAGNOSIS — E66811 Obesity, class 1: Secondary | ICD-10-CM

## 2024-04-14 DIAGNOSIS — Z6830 Body mass index (BMI) 30.0-30.9, adult: Secondary | ICD-10-CM

## 2024-04-14 DIAGNOSIS — K219 Gastro-esophageal reflux disease without esophagitis: Secondary | ICD-10-CM

## 2024-04-14 DIAGNOSIS — N189 Chronic kidney disease, unspecified: Secondary | ICD-10-CM

## 2024-04-14 DIAGNOSIS — I5022 Chronic systolic (congestive) heart failure: Secondary | ICD-10-CM | POA: Diagnosis not present

## 2024-04-14 DIAGNOSIS — Z01810 Encounter for preprocedural cardiovascular examination: Secondary | ICD-10-CM | POA: Diagnosis not present

## 2024-04-14 DIAGNOSIS — I255 Ischemic cardiomyopathy: Secondary | ICD-10-CM

## 2024-04-14 MED ORDER — SACUBITRIL-VALSARTAN 24-26 MG PO TABS
1.0000 | ORAL_TABLET | Freq: Two times a day (BID) | ORAL | 3 refills | Status: AC
  Filled 2024-04-14: qty 180, 90d supply, fill #0
  Filled 2024-08-02: qty 180, 90d supply, fill #1

## 2024-04-14 MED ORDER — EMPAGLIFLOZIN 10 MG PO TABS
10.0000 mg | ORAL_TABLET | Freq: Every day | ORAL | 3 refills | Status: AC
  Filled 2024-04-14: qty 90, 90d supply, fill #0
  Filled 2024-07-08: qty 90, 90d supply, fill #1

## 2024-04-14 MED ORDER — METOPROLOL SUCCINATE ER 50 MG PO TB24
50.0000 mg | ORAL_TABLET | Freq: Every day | ORAL | 3 refills | Status: AC
  Filled 2024-04-14 – 2024-06-02 (×2): qty 90, 90d supply, fill #0
  Filled 2024-08-31: qty 90, 90d supply, fill #1

## 2024-04-14 MED ORDER — PANTOPRAZOLE SODIUM 40 MG PO TBEC
40.0000 mg | DELAYED_RELEASE_TABLET | Freq: Every day | ORAL | 0 refills | Status: DC
  Filled 2024-04-14: qty 90, 90d supply, fill #0

## 2024-04-14 MED ORDER — ATORVASTATIN CALCIUM 80 MG PO TABS
80.0000 mg | ORAL_TABLET | Freq: Every evening | ORAL | 3 refills | Status: AC
  Filled 2024-04-14 – 2024-06-02 (×2): qty 90, 90d supply, fill #0
  Filled 2024-08-31: qty 90, 90d supply, fill #1

## 2024-04-14 NOTE — Patient Instructions (Signed)
 Medication Instructions:   DECREASE Protonix  one (1) tablet by mouth ( 40 mg) every other day than as needed for heart burn, gerd or reflux.   *If you need a refill on your cardiac medications before your next appointment, please call your pharmacy*  Lab Work:  None ordered.  If you have labs (blood work) drawn today and your tests are completely normal, you will receive your results only by: MyChart Message (if you have MyChart) OR A paper copy in the mail If you have any lab test that is abnormal or we need to change your treatment, we will call you to review the results.  Testing/Procedures:  None ordered.   Follow-Up: At [facility], you and your health needs are our priority.  As part of our continuing mission to provide you with exceptional heart care, our providers are all part of one team.  This team includes your primary Cardiologist (physician) and Advanced Practice Providers or APPs (Physician Assistants and Nurse Practitioners) who all work together to provide you with the care you need, when you need it.  Your next appointment:   1 year(s)  Provider:   Oneil Parchment, MD    We recommend signing up for the patient portal called MyChart.  Sign up information is provided on this After Visit Summary.  MyChart is used to connect with patients for Virtual Visits (Telemedicine).  Patients are able to view lab/test results, encounter notes, upcoming appointments, etc.  Non-urgent messages can be sent to your provider as well.   To learn more about what you can do with MyChart, go to [email or web address].   Other Instructions  Your physician wants you to follow-up in: 1 year.  You will receive a reminder letter in the mail two months in advance. If you don't receive a letter, please call our office to schedule the follow-up appointment.

## 2024-04-16 ENCOUNTER — Other Ambulatory Visit (HOSPITAL_COMMUNITY): Payer: Self-pay

## 2024-04-21 ENCOUNTER — Other Ambulatory Visit (HOSPITAL_COMMUNITY): Payer: Self-pay

## 2024-04-21 MED ORDER — AZITHROMYCIN 500 MG PO TABS
500.0000 mg | ORAL_TABLET | Freq: Every day | ORAL | 0 refills | Status: AC
  Filled 2024-04-21: qty 3, 3d supply, fill #0

## 2024-04-30 ENCOUNTER — Other Ambulatory Visit: Payer: Self-pay

## 2024-05-03 ENCOUNTER — Other Ambulatory Visit (HOSPITAL_COMMUNITY): Payer: Self-pay

## 2024-05-04 ENCOUNTER — Other Ambulatory Visit (HOSPITAL_COMMUNITY): Payer: Self-pay

## 2024-05-09 ENCOUNTER — Other Ambulatory Visit (HOSPITAL_COMMUNITY): Payer: Self-pay

## 2024-05-15 ENCOUNTER — Other Ambulatory Visit (HOSPITAL_COMMUNITY): Payer: Self-pay

## 2024-05-16 ENCOUNTER — Other Ambulatory Visit: Payer: Self-pay

## 2024-05-16 ENCOUNTER — Other Ambulatory Visit (HOSPITAL_COMMUNITY): Payer: Self-pay

## 2024-05-16 MED ORDER — FLUZONE 0.5 ML IM SUSY
0.5000 mL | PREFILLED_SYRINGE | INTRAMUSCULAR | 0 refills | Status: AC
  Filled 2024-05-16: qty 0.5, 1d supply, fill #0

## 2024-05-16 MED ORDER — COVID-19 MRNA VAC-TRIS(PFIZER) 30 MCG/0.3ML IM SUSY
0.3000 mL | PREFILLED_SYRINGE | Freq: Once | INTRAMUSCULAR | 0 refills | Status: AC
  Filled 2024-05-16: qty 0.3, 1d supply, fill #0

## 2024-05-22 ENCOUNTER — Other Ambulatory Visit (HOSPITAL_COMMUNITY): Payer: Self-pay

## 2024-05-22 ENCOUNTER — Other Ambulatory Visit: Payer: Self-pay

## 2024-05-22 MED ORDER — IPRATROPIUM BROMIDE 0.06 % NA SOLN
2.0000 | Freq: Three times a day (TID) | NASAL | 3 refills | Status: AC | PRN
  Filled 2024-05-22: qty 15, 25d supply, fill #0

## 2024-05-22 MED ORDER — AZELASTINE-FLUTICASONE 137-50 MCG/ACT NA SUSP
1.0000 | Freq: Two times a day (BID) | NASAL | 5 refills | Status: AC
  Filled 2024-05-22 – 2024-06-03 (×2): qty 23, 30d supply, fill #0
  Filled 2024-06-29: qty 23, 30d supply, fill #1
  Filled 2024-07-29 – 2024-08-22 (×3): qty 23, 30d supply, fill #2

## 2024-05-22 MED ORDER — LEVALBUTEROL TARTRATE 45 MCG/ACT IN AERO
2.0000 | INHALATION_SPRAY | RESPIRATORY_TRACT | 0 refills | Status: AC
  Filled 2024-05-22: qty 15, 30d supply, fill #0
  Filled 2024-06-03: qty 15, 16d supply, fill #0

## 2024-05-22 MED ORDER — MONTELUKAST SODIUM 10 MG PO TABS
10.0000 mg | ORAL_TABLET | Freq: Every day | ORAL | 5 refills | Status: AC
  Filled 2024-05-22: qty 30, 30d supply, fill #0

## 2024-05-22 MED ORDER — TRELEGY ELLIPTA 200-62.5-25 MCG/ACT IN AEPB
1.0000 | INHALATION_SPRAY | Freq: Every day | RESPIRATORY_TRACT | 5 refills | Status: AC
  Filled 2024-05-22 – 2024-07-16 (×7): qty 60, 30d supply, fill #0
  Filled 2024-08-17 – 2024-08-21 (×2): qty 60, 30d supply, fill #1

## 2024-05-22 MED ORDER — LEVOCETIRIZINE DIHYDROCHLORIDE 5 MG PO TABS
5.0000 mg | ORAL_TABLET | Freq: Every day | ORAL | 1 refills | Status: DC
  Filled 2024-05-22 – 2024-06-03 (×2): qty 30, 30d supply, fill #0
  Filled 2024-06-29: qty 30, 30d supply, fill #1

## 2024-05-25 ENCOUNTER — Other Ambulatory Visit (HOSPITAL_COMMUNITY): Payer: Self-pay

## 2024-05-26 ENCOUNTER — Other Ambulatory Visit: Payer: Self-pay

## 2024-06-02 ENCOUNTER — Other Ambulatory Visit (HOSPITAL_COMMUNITY): Payer: Self-pay

## 2024-06-02 ENCOUNTER — Other Ambulatory Visit: Payer: Self-pay

## 2024-06-03 ENCOUNTER — Other Ambulatory Visit: Payer: Self-pay

## 2024-06-03 ENCOUNTER — Other Ambulatory Visit (HOSPITAL_COMMUNITY): Payer: Self-pay

## 2024-06-04 ENCOUNTER — Other Ambulatory Visit: Payer: Self-pay

## 2024-06-04 ENCOUNTER — Other Ambulatory Visit (HOSPITAL_COMMUNITY): Payer: Self-pay

## 2024-06-05 ENCOUNTER — Other Ambulatory Visit (HOSPITAL_COMMUNITY): Payer: Self-pay

## 2024-06-06 ENCOUNTER — Other Ambulatory Visit (HOSPITAL_COMMUNITY): Payer: Self-pay

## 2024-06-06 ENCOUNTER — Other Ambulatory Visit: Payer: Self-pay

## 2024-06-15 ENCOUNTER — Other Ambulatory Visit (HOSPITAL_COMMUNITY): Payer: Self-pay

## 2024-06-18 ENCOUNTER — Telehealth (HOSPITAL_BASED_OUTPATIENT_CLINIC_OR_DEPARTMENT_OTHER): Payer: Self-pay | Admitting: *Deleted

## 2024-06-18 NOTE — Telephone Encounter (Signed)
   Pre-operative Risk Assessment    Patient Name: Bonnie Nelson  DOB: 07/08/1966 MRN: [id number]   Date of last office visit: 04/14/2024 Date of next office visit: None  Request for Surgical Clearance    Procedure:  Left Shoulder Arthroscopy w/ RCR  Date of Surgery:  Clearance TBD                                 Surgeon:  Dr. Franky Pointer Surgeon's Group or Practice Name:  [facility] Phone number:  [phone] Fax number:  [phone]   Type of Clearance Requested:   - Medical  - Pharmacy:  Hold Aspirin  Not indicated.   Type of Anesthesia:  General    Additional requests/questions:    Signed, Edsel Grayce Sanders   06/18/2024, 3:02 PM

## 2024-06-19 ENCOUNTER — Encounter: Payer: Self-pay | Admitting: Cardiology

## 2024-06-19 NOTE — Telephone Encounter (Signed)
 Erskin Browning,  Ms. Gilles is requesting preoperative cardiac evaluation for left shoulder arthroscopy with RCR.  Surgery is TBD.  You recently saw her in clinic.  She was doing well at that time from a cardiac standpoint.  Would you be able to comment on risk for upcoming surgery?  Thank you for your help.  Josefa HERO. Jaspal Pultz NP-C     06/19/2024, 1:26 PM [facility] [address] [place], [place] [address] Office [phone]

## 2024-06-21 NOTE — Telephone Encounter (Signed)
 According to the Revised Cardiac Risk Index (RCRI), her Perioperative Risk of Major Cardiac Event is (%): 6.6 based on history of ischemic heart disease and heart failure, however she is medically optimized. Her Functional Capacity in METs is: 8.23 according to the Duke Activity Status Index (DASI). The patient is doing well from a cardiac perspective. Therefore, based on ACC/AHA guidelines, the patient would be at acceptable risk for the planned procedure without further cardiovascular testing. Ideally aspirin  should be continued without interruption, however if the bleeding risk is too great, aspirin  may be held for 5-7 days prior to surgery. Please resume aspirin  post operatively when it is felt to be safe from a bleeding standpoint. I will forward clearance to requesting provider   Rosaline EMERSON Bane, NP-C  06/21/2024, 6:51 AM [address] [place], [place] [address] Office [phone] Fax [phone]

## 2024-07-02 ENCOUNTER — Other Ambulatory Visit (HOSPITAL_COMMUNITY): Payer: Self-pay

## 2024-07-04 ENCOUNTER — Other Ambulatory Visit (HOSPITAL_COMMUNITY): Payer: Self-pay

## 2024-07-04 ENCOUNTER — Other Ambulatory Visit: Payer: Self-pay

## 2024-07-05 ENCOUNTER — Other Ambulatory Visit (HOSPITAL_COMMUNITY): Payer: Self-pay

## 2024-07-06 ENCOUNTER — Other Ambulatory Visit (HOSPITAL_COMMUNITY): Payer: Self-pay

## 2024-07-07 ENCOUNTER — Other Ambulatory Visit: Payer: Self-pay

## 2024-07-10 ENCOUNTER — Other Ambulatory Visit (HOSPITAL_COMMUNITY): Payer: Self-pay

## 2024-07-10 ENCOUNTER — Other Ambulatory Visit: Payer: Self-pay

## 2024-07-11 ENCOUNTER — Other Ambulatory Visit (HOSPITAL_COMMUNITY): Payer: Self-pay

## 2024-07-11 ENCOUNTER — Other Ambulatory Visit: Payer: Self-pay

## 2024-07-15 ENCOUNTER — Other Ambulatory Visit (HOSPITAL_COMMUNITY): Payer: Self-pay

## 2024-07-15 MED ORDER — ONDANSETRON HCL 4 MG PO TABS
4.0000 mg | ORAL_TABLET | Freq: Four times a day (QID) | ORAL | 0 refills | Status: AC | PRN
  Filled 2024-07-15: qty 10, 3d supply, fill #0

## 2024-07-15 MED ORDER — OXYCODONE-ACETAMINOPHEN 5-325 MG PO TABS
1.0000 | ORAL_TABLET | ORAL | 0 refills | Status: AC | PRN
  Filled 2024-07-15: qty 20, 4d supply, fill #0

## 2024-07-15 MED ORDER — CYCLOBENZAPRINE HCL 10 MG PO TABS
10.0000 mg | ORAL_TABLET | Freq: Four times a day (QID) | ORAL | 0 refills | Status: AC | PRN
  Filled 2024-07-15: qty 30, 8d supply, fill #0

## 2024-07-15 MED ORDER — CELECOXIB 200 MG PO CAPS
200.0000 mg | ORAL_CAPSULE | Freq: Every day | ORAL | 1 refills | Status: AC
  Filled 2024-07-15: qty 30, 30d supply, fill #0
  Filled 2024-08-07 – 2024-08-22 (×3): qty 30, 30d supply, fill #1

## 2024-07-16 ENCOUNTER — Other Ambulatory Visit (HOSPITAL_COMMUNITY): Payer: Self-pay

## 2024-07-16 ENCOUNTER — Other Ambulatory Visit: Payer: Self-pay

## 2024-07-18 ENCOUNTER — Other Ambulatory Visit (HOSPITAL_BASED_OUTPATIENT_CLINIC_OR_DEPARTMENT_OTHER): Payer: Self-pay | Admitting: Nurse Practitioner

## 2024-07-18 ENCOUNTER — Other Ambulatory Visit (HOSPITAL_COMMUNITY): Payer: Self-pay

## 2024-07-18 ENCOUNTER — Other Ambulatory Visit: Payer: Self-pay

## 2024-07-18 MED ORDER — PANTOPRAZOLE SODIUM 40 MG PO TBEC
40.0000 mg | DELAYED_RELEASE_TABLET | Freq: Every day | ORAL | 0 refills | Status: AC
  Filled 2024-07-18: qty 90, 90d supply, fill #0

## 2024-07-26 ENCOUNTER — Other Ambulatory Visit (HOSPITAL_COMMUNITY): Payer: Self-pay

## 2024-07-29 ENCOUNTER — Other Ambulatory Visit (HOSPITAL_COMMUNITY): Payer: Self-pay

## 2024-07-29 MED ORDER — LEVOCETIRIZINE DIHYDROCHLORIDE 5 MG PO TABS
5.0000 mg | ORAL_TABLET | Freq: Every day | ORAL | 5 refills | Status: AC
  Filled 2024-07-29: qty 30, 30d supply, fill #0
  Filled 2024-08-31: qty 30, 30d supply, fill #1

## 2024-07-30 ENCOUNTER — Other Ambulatory Visit (HOSPITAL_COMMUNITY): Payer: Self-pay

## 2024-08-01 ENCOUNTER — Other Ambulatory Visit (HOSPITAL_COMMUNITY): Payer: Self-pay

## 2024-08-04 ENCOUNTER — Other Ambulatory Visit (HOSPITAL_COMMUNITY): Payer: Self-pay

## 2024-08-04 ENCOUNTER — Telehealth: Payer: Self-pay | Admitting: Pharmacy Technician

## 2024-08-04 NOTE — Telephone Encounter (Signed)
 Pharmacy Patient Advocate Encounter  Received notification from [facility] that Prior Authorization for Sacubitril -Valsartan  24-26MG  tablet has been APPROVED from 08/04/24 to 08/04/25   PA #/Case ID/Reference #: [id number]

## 2024-08-04 NOTE — Telephone Encounter (Signed)
" ° °  sacubitril -valsartan  (ENTRESTO ) 24-26 MG   Pharmacy Patient Advocate Encounter   Received notification from staff that prior authorization for sacubitril /valsartan  is required/requested.   Insurance verification completed.   The patient is insured through [facility].   Per test claim: PA required; PA submitted to above mentioned insurance via Latent Key/confirmation #/EOC [id number] Status is pending  "

## 2024-08-08 ENCOUNTER — Other Ambulatory Visit: Payer: Self-pay

## 2024-08-08 ENCOUNTER — Other Ambulatory Visit (HOSPITAL_COMMUNITY): Payer: Self-pay

## 2024-08-08 MED ORDER — CEPHALEXIN 500 MG PO CAPS
500.0000 mg | ORAL_CAPSULE | Freq: Two times a day (BID) | ORAL | 0 refills | Status: AC
  Filled 2024-08-08: qty 14, 7d supply, fill #0

## 2024-08-11 ENCOUNTER — Other Ambulatory Visit (HOSPITAL_COMMUNITY): Payer: Self-pay

## 2024-08-11 ENCOUNTER — Other Ambulatory Visit: Payer: Self-pay

## 2024-08-16 ENCOUNTER — Other Ambulatory Visit (HOSPITAL_COMMUNITY): Payer: Self-pay

## 2024-08-20 ENCOUNTER — Other Ambulatory Visit (HOSPITAL_COMMUNITY): Payer: Self-pay

## 2024-08-21 ENCOUNTER — Other Ambulatory Visit (HOSPITAL_COMMUNITY): Payer: Self-pay

## 2024-08-22 ENCOUNTER — Other Ambulatory Visit (HOSPITAL_COMMUNITY): Payer: Self-pay

## 2024-08-22 ENCOUNTER — Other Ambulatory Visit: Payer: Self-pay

## 2024-08-31 ENCOUNTER — Other Ambulatory Visit (HOSPITAL_COMMUNITY): Payer: Self-pay

## 2024-09-01 ENCOUNTER — Other Ambulatory Visit (HOSPITAL_COMMUNITY): Payer: Self-pay

## 2024-09-01 ENCOUNTER — Other Ambulatory Visit: Payer: Self-pay

## 2024-09-04 ENCOUNTER — Other Ambulatory Visit (HOSPITAL_COMMUNITY): Payer: Self-pay

## 2024-09-04 MED ORDER — AZITHROMYCIN 500 MG PO TABS
ORAL_TABLET | ORAL | 0 refills | Status: AC
  Filled 2024-09-04: qty 1, 1d supply, fill #0

## 2024-09-05 ENCOUNTER — Other Ambulatory Visit (HOSPITAL_COMMUNITY): Payer: Self-pay

## 2024-09-12 ENCOUNTER — Other Ambulatory Visit: Payer: Self-pay
